# Patient Record
Sex: Male | Born: 1950 | Race: White | Hispanic: No | Marital: Married | Smoking: Current every day smoker
Health system: Southern US, Community
[De-identification: ages and names within clinical notes are randomized; demographics above are authoritative.]

## PROBLEM LIST (undated history)

## (undated) DIAGNOSIS — J449 Chronic obstructive pulmonary disease, unspecified: Secondary | ICD-10-CM

## (undated) DIAGNOSIS — I10 Essential (primary) hypertension: Secondary | ICD-10-CM

## (undated) DIAGNOSIS — M109 Gout, unspecified: Secondary | ICD-10-CM

## (undated) DIAGNOSIS — E785 Hyperlipidemia, unspecified: Secondary | ICD-10-CM

## (undated) HISTORY — DX: Gout, unspecified: M10.9

## (undated) HISTORY — PX: CATARACT EXTRACTION: SUR2

---

## 1997-10-27 ENCOUNTER — Encounter: Admission: RE | Admit: 1997-10-27 | Discharge: 1998-01-25 | Payer: Self-pay

## 2005-09-04 ENCOUNTER — Encounter: Admission: RE | Admit: 2005-09-04 | Discharge: 2005-09-04 | Payer: Self-pay | Admitting: Family Medicine

## 2005-10-22 ENCOUNTER — Ambulatory Visit: Payer: Self-pay | Admitting: Cardiology

## 2005-10-26 ENCOUNTER — Ambulatory Visit: Payer: Self-pay | Admitting: Cardiology

## 2006-01-07 ENCOUNTER — Ambulatory Visit: Payer: Self-pay | Admitting: Cardiovascular Disease

## 2014-08-06 ENCOUNTER — Telehealth: Payer: Self-pay | Admitting: Family Medicine

## 2014-08-07 ENCOUNTER — Encounter (INDEPENDENT_AMBULATORY_CARE_PROVIDER_SITE_OTHER): Payer: Self-pay

## 2014-08-07 ENCOUNTER — Ambulatory Visit (INDEPENDENT_AMBULATORY_CARE_PROVIDER_SITE_OTHER): Payer: Self-pay | Admitting: Family

## 2014-08-07 ENCOUNTER — Encounter: Payer: Self-pay | Admitting: Family

## 2014-08-07 VITALS — BP 155/76 | HR 116 | Temp 97.1°F | Ht 67.0 in | Wt 166.0 lb

## 2014-08-07 DIAGNOSIS — M542 Cervicalgia: Secondary | ICD-10-CM

## 2014-08-07 MED ORDER — CYCLOBENZAPRINE HCL 10 MG PO TABS: 10.0000 mg | ORAL_TABLET | Freq: Three times a day (TID) | ORAL | Status: DC | PRN

## 2014-08-07 MED ORDER — TRAMADOL HCL 50 MG PO TABS: 50.0000 mg | ORAL_TABLET | Freq: Three times a day (TID) | ORAL | Status: DC | PRN

## 2014-08-07 NOTE — Patient Instructions (Signed)
Back Pain, Adult Low back pain is very common. About 1 in 5 people have back pain.The cause of low back pain is rarely dangerous. The pain often gets better over time.About half of people with a sudden onset of back pain feel better in just 2 weeks. About 8 in 10 people feel better by 6 weeks.  CAUSES Some common causes of back pain include:  Strain of the muscles or ligaments supporting the spine.  Wear and tear (degeneration) of the spinal discs.  Arthritis.  Direct injury to the back. DIAGNOSIS Most of the time, the direct cause of low back pain is not known.However, back pain can be treated effectively even when the exact cause of the pain is unknown.Answering your caregiver's questions about your overall health and symptoms is one of the most accurate ways to make sure the cause of your pain is not dangerous. If your caregiver needs more information, he or she may order lab work or imaging tests (X-rays or MRIs).However, even if imaging tests show changes in your back, this usually does not require surgery. HOME CARE INSTRUCTIONS For many people, back pain returns.Since low back pain is rarely dangerous, it is often a condition that people can learn to manageon their own.   Remain active. It is stressful on the back to sit or stand in one place. Do not sit, drive, or stand in one place for more than 30 minutes at a time. Take short walks on level surfaces as soon as pain allows.Try to increase the length of time you walk each day.  Do not stay in bed.Resting more than 1 or 2 days can delay your recovery.  Do not avoid exercise or work.Your body is made to move.It is not dangerous to be active, even though your back may hurt.Your back will likely heal faster if you return to being active before your pain is gone.  Pay attention to your body when you bend and lift. Many people have less discomfortwhen lifting if they bend their knees, keep the load close to their bodies,and  avoid twisting. Often, the most comfortable positions are those that put less stress on your recovering back.  Find a comfortable position to sleep. Use a firm mattress and lie on your side with your knees slightly bent. If you lie on your back, put a pillow under your knees.  Only take over-the-counter or prescription medicines as directed by your caregiver. Over-the-counter medicines to reduce pain and inflammation are often the most helpful.Your caregiver may prescribe muscle relaxant drugs.These medicines help dull your pain so you can more quickly return to your normal activities and healthy exercise.  Put ice on the injured area.  Put ice in a plastic bag.  Place a towel between your skin and the bag.  Leave the ice on for 15-20 minutes, 03-04 times a day for the first 2 to 3 days. After that, ice and heat may be alternated to reduce pain and spasms.  Ask your caregiver about trying back exercises and gentle massage. This may be of some benefit.  Avoid feeling anxious or stressed.Stress increases muscle tension and can worsen back pain.It is important to recognize when you are anxious or stressed and learn ways to manage it.Exercise is a great option. SEEK MEDICAL CARE IF:  You have pain that is not relieved with rest or medicine.  You have pain that does not improve in 1 week.  You have new symptoms.  You are generally not feeling well. SEEK   IMMEDIATE MEDICAL CARE IF:   You have pain that radiates from your back into your legs.  You develop new bowel or bladder control problems.  You have unusual weakness or numbness in your arms or legs.  You develop nausea or vomiting.  You develop abdominal pain.  You feel faint. Document Released: 06/25/2005 Document Revised: 12/25/2011 Document Reviewed: 10/27/2013 Exit[employer] Patient Information 2015 [employer]. This information is not intended to replace advice given to you by your health care provider. Make sure you  discuss any questions you have with your health care provider.  

## 2014-08-07 NOTE — Progress Notes (Signed)
   Subjective:    Patient ID: Victor Shields, male    DOB: 30-Apr-1951, 64 y.o.   MRN: [id number]  Shoulder Pain  The pain is present in the left shoulder. This is a new problem. The current episode started in the past 7 days. The problem occurs intermittently. The problem has been waxing and waning. The quality of the pain is described as aching. The pain is at a severity of 10/10. The pain is mild. Pertinent negatives include no fever, joint swelling, limited range of motion, numbness, stiffness or tingling. The symptoms are aggravated by activity. He has tried NSAIDS and cold for the symptoms. The treatment provided mild relief. His past medical history is significant for gout and osteoarthritis.      Review of Systems  Constitutional: Negative.  Negative for fever.  HENT: Negative.   Respiratory: Negative.   Cardiovascular: Negative.   Gastrointestinal: Negative.   Endocrine: Negative.   Genitourinary: Negative.   Musculoskeletal: Positive for gout. Negative for stiffness.  Neurological: Negative.  Negative for tingling and numbness.  Hematological: Negative.   Psychiatric/Behavioral: Negative.   All other systems reviewed and are negative.      Objective:   Physical Exam  Constitutional: He is oriented to person, place, and time. He appears well-developed and well-nourished. No distress.  Neck: Normal range of motion. Neck supple. No thyromegaly present.  Cardiovascular: Normal rate, regular rhythm, normal heart sounds and intact distal pulses.   No murmur heard. Pulmonary/Chest: Effort normal and breath sounds normal. No respiratory distress. He has no wheezes.  Abdominal: Soft. Bowel sounds are normal. He exhibits no distension. There is no tenderness.  Musculoskeletal: Normal range of motion. He exhibits tenderness (Left upper pain-with palpation ). He exhibits no edema.  Neurological: He is alert and oriented to person, place, and time. He has normal reflexes. No  cranial nerve deficit.  Skin: Skin is warm and dry. No rash noted. No erythema.  Psychiatric: He has a normal mood and affect. His behavior is normal. Judgment and thought content normal.  Vitals reviewed.   BP 155/76 mmHg  Pulse 116  Temp(Src) 97.1 F (36.2 C) (Oral)  Ht $R'5\' 7"'yd$  (1.702 m)  Wt 166 lb (75.297 kg)  BMI 25.99 kg/m2       Assessment & Plan:  1. Neck pain on left side -Rest -Ice and heat -PT needs to have regular CPE- BP elevated- Could be pain realted -Sedation precaution discussed - cyclobenzaprine (FLEXERIL) 10 MG tablet; Take 1 tablet (10 mg total) by mouth 3 (three) times daily as needed for muscle spasms.  Dispense: 30 tablet; Refill: 1 - traMADol (ULTRAM) 50 MG tablet; Take 1 tablet (50 mg total) by mouth every 8 (eight) hours as needed.  Dispense: 30 tablet; Refill: 0 - [place], FNP

## 2014-08-07 NOTE — Addendum Note (Signed)
Addended by: Prescott GumLAND, Celes Dedic M on: 08/07/2014 10:22 AM   Modules accepted: Kipp BroodSmartSet

## 2014-08-08 LAB — CMP14+EGFR
A/G RATIO: 1.7 (ref 1.1–2.5)
ALT: 20 IU/L (ref 0–44)
AST: 19 IU/L (ref 0–40)
Albumin: 3.9 g/dL (ref 3.6–4.8)
Alkaline Phosphatase: 107 IU/L (ref 39–117)
BUN/Creatinine Ratio: 14 (ref 10–22)
BUN: 10 mg/dL (ref 8–27)
CHLORIDE: 98 mmol/L (ref 97–108)
CO2: 25 mmol/L (ref 18–29)
Calcium: 9.3 mg/dL (ref 8.6–10.2)
Creatinine, Ser: 0.71 mg/dL — ABNORMAL LOW (ref 0.76–1.27)
GFR calc Af Amer: 115 mL/min/{1.73_m2} (ref >59)
GFR, EST NON AFRICAN AMERICAN: 99 mL/min/{1.73_m2} (ref >59)
GLUCOSE: 247 mg/dL — AB (ref 65–99)
Globulin, Total: 2.3 g/dL (ref 1.5–4.5)
Potassium: 3.7 mmol/L (ref 3.5–5.2)
SODIUM: 140 mmol/L (ref 134–144)
Total Bilirubin: 0.8 mg/dL (ref 0.0–1.2)
Total Protein: 6.2 g/dL (ref 6.0–8.5)

## 2014-08-09 ENCOUNTER — Other Ambulatory Visit: Payer: Self-pay | Admitting: Family

## 2014-08-09 MED ORDER — METFORMIN HCL 500 MG PO TABS: 500.0000 mg | ORAL_TABLET | Freq: Two times a day (BID) | ORAL | Status: DC

## 2014-08-09 NOTE — Telephone Encounter (Signed)
-----   Message from Victor Spencerhristy A [name], FNP sent at 08/09/2014  8:39 AM EST ----- Kidney and liver function stable Glucose very elevated- Pt needs to be on low carb diet and start taking metformin with a meal for diabetes- He needs to schedule appt asap! ( Pt states he does not have insurance until March)

## 2014-08-09 NOTE — Telephone Encounter (Signed)
Need to discuss lab results and diet.

## 2014-08-09 NOTE — Telephone Encounter (Signed)
His wife is aware of high blood sugar.  Begin Metformin BID and come by office for free blood sugar meter.  Patient has no insurance.

## 2015-04-06 DIAGNOSIS — H5200 Hypermetropia, unspecified eye: Secondary | ICD-10-CM | POA: Diagnosis not present

## 2015-04-06 DIAGNOSIS — H25813 Combined forms of age-related cataract, bilateral: Secondary | ICD-10-CM | POA: Diagnosis not present

## 2015-04-06 DIAGNOSIS — H5210 Myopia, unspecified eye: Secondary | ICD-10-CM | POA: Diagnosis not present

## 2016-12-25 ENCOUNTER — Encounter: Payer: Self-pay | Admitting: Physician Assistant

## 2016-12-25 ENCOUNTER — Ambulatory Visit (INDEPENDENT_AMBULATORY_CARE_PROVIDER_SITE_OTHER): Payer: Medicare HMO | Admitting: Physician Assistant

## 2016-12-25 VITALS — BP 123/65 | HR 71 | Temp 97.1°F | Ht 67.0 in | Wt 157.2 lb

## 2016-12-25 DIAGNOSIS — J399 Disease of upper respiratory tract, unspecified: Secondary | ICD-10-CM | POA: Diagnosis not present

## 2016-12-25 DIAGNOSIS — H6123 Impacted cerumen, bilateral: Secondary | ICD-10-CM

## 2016-12-25 MED ORDER — SULFAMETHOXAZOLE-TRIMETHOPRIM 800-160 MG PO TABS: 1.0000 | ORAL_TABLET | Freq: Two times a day (BID) | ORAL | 0 refills | Status: DC

## 2016-12-25 MED ORDER — BUDESONIDE-FORMOTEROL FUMARATE 80-4.5 MCG/ACT IN AERO: 2.0000 | INHALATION_SPRAY | Freq: Two times a day (BID) | RESPIRATORY_TRACT | 3 refills | Status: DC

## 2016-12-25 NOTE — Patient Instructions (Signed)
Ear Irrigation What is ear irrigation? Ear irrigation is a procedure to wash dirt and wax out of your ear canal. This procedure is also called lavage. You may need ear irrigation if you are having trouble hearing because of a buildup of earwax. You may also have ear irrigation as part of the treatment for an ear infection. Getting wax and dirt out of your ear canal can help some medicines given as ear drops work better. How is ear irrigation performed? The procedure may vary among health care providers and hospitals. You may be given ear drops to put in your ear 15-20 minutes before irrigation. This helps loosen the wax. Then, a syringe containing water and a sterile salt solution (saline) can be gently inserted into the ear canal. The saline is used to flush out wax and other debris. Ear irrigation kits are also available to use at home. Ask your health care provider if this is an option for you. Use a home irrigation kit only as told by your health care provider. Read the package instructions carefully. Follow the directions for using the syringe. Use water that is room temperature.  Do not do ear irrigation at home if you:  Have diabetes. Diabetes increases the risk of infection.  Have a hole or tear in your eardrum.  Have tubes in your ears. What are the risks of ear irrigation? Generally, this is a safe procedure. However, problems may occur, including:  Infection.  Pain.  Hearing loss.  Pushing water and debris into the eardrum. This can occur if there are holes in the eardrum.  Ear irrigation failing to work. How should I care for my ears after having them irrigated? Cleaning   Clean the outside of your ear with a soft washcloth daily.  If told by your health care provider, use a few drops of baby oil, mineral oil, glycerin, hydrogen peroxide, or over-the-counter earwax softening drops.  Do not use cotton swabs to clean your ears. These can push wax down into the ear  canal.  Do not put anything into your ears to try to remove wax. This includes ear candles. General Instructions   Take over-the-counter and prescription medicines only as told by your health care provider.  If you were prescribed an antibiotic medicine, use it as told by your health care provider. Do not stop using the antibiotic even if your condition improves.  Keep all follow-up visits as told by your health care provider. This is important.  Visit your health care provider at least once a year to have your ears and hearing checked. When should I seek medical care? Seek medical care if:  Your hearing is not improving or is getting worse.  You have pain or redness in your ear.  You have fluid, blood, or pus coming out of your ear. This information is not intended to replace advice given to you by your health care provider. Make sure you discuss any questions you have with your health care provider. Document Released: 07/22/2015 Document Revised: 05/21/2016 Document Reviewed: 12/02/2014 Elsevier Interactive Patient Education  2017 [employer]  

## 2016-12-25 NOTE — Progress Notes (Addendum)
Subjective:     Patient ID: Victor Shields, male   DOB: June 22, 1951, 66 y.o.   MRN: [id number]  HPI Pt with multiple complaints #1- cough and congestion for 2-3 weeks Cough is productive of colored sputum He is a long term smoker 'Using a friends Symbicort and it has helped with some sx #2- Bilateral ear fullness and decrease in hearing  Review of Systems  Constitutional: Negative for activity change, appetite change, chills, fatigue and fever.  HENT: Positive for congestion, hearing loss, postnasal drip and sinus pressure. Negative for ear discharge, ear pain, rhinorrhea, sinus pain and sore throat.   Respiratory: Positive for cough and chest tightness. Negative for choking, shortness of breath and wheezing.   Cardiovascular: Negative.        Objective:   Physical Exam  Constitutional: He appears well-developed and well-nourished.  HENT:  Right Ear: External ear normal.  Left Ear: External ear normal.  Mouth/Throat: Oropharynx is clear and moist. No oropharyngeal exudate.  Neck: Neck supple. No JVD present.  No bruits  Cardiovascular: Normal rate, regular rhythm and normal heart sounds.   No murmur heard. Pulmonary/Chest: Effort normal. He has wheezes. He has no rales.  Coarse lungs sounds bilat  Lymphadenopathy:    He has no cervical adenopathy.  Nursing note and vitals reviewed. Both ears canals impacted with cerumen Ears irrigated with large cerumen plugs removed Following irrigation canals/TM's nl     Assessment:     1. Upper respiratory disease   2. Impacted cerumen, bilateral        Plan:     OTC ear wax softener monthly No Q tip use Rx for his own Symbicort  Inhaler- rinse mouth after use Bactrim DS 1 po bid x 10 days  Fluids Rest Decrease smoking use Discussed needs regular appt with regular provider in the next month to follow his chronic health issues

## 2017-11-13 NOTE — Progress Notes (Signed)
Subjective: CC: ear pain, balance problem, left eye irritated PCP: Sharion Balloon, FNP NID:[id number] Victor Shields is a 67 y.o. male presenting to clinic today for:  1. Ear pain Patient reports that he has had bilateral ill fullness for a couple of weeks now.  He reports feeling off balance.  He reports associated sinus congestion.  No fevers, chills, diarrhea or vomiting.  He did have some nausea when he became dizzy this morning.  No vertigo.  He reports sensation of decreased hearing bilaterally.  He has a chronic productive cough without hemoptysis.  He is an every day smoker.  2.  Vision change Patient reports that he has had decreased vision in the left eye since Sunday.  He reports that this is associated with increased tearing.  Denies any pain in the eye.  No irritation.  No redness.  No pain with moving the eye around.  He does wear corrective lenses at baseline.  He has not seen his eye doctor since last year but is due for an appointment.   ROS: Per HPI  Allergies  Allergen Reactions  . Penicillins Swelling   Past Medical History:  Diagnosis Date  . Gout     Current Outpatient Medications:  .  budesonide-formoterol (SYMBICORT) 80-4.5 MCG/ACT inhaler, Inhale 2 puffs into the lungs 2 (two) times daily., Disp: 1 Inhaler, Rfl: 3 Social History   Socioeconomic History  . Marital status: Married    Spouse name: Not on file  . Number of children: Not on file  . Years of education: Not on file  . Highest education level: Not on file  Occupational History  . Not on file  Social Needs  . Financial resource strain: Not on file  . Food insecurity:    Worry: Not on file    Inability: Not on file  . Transportation needs:    Medical: Not on file    Non-medical: Not on file  Tobacco Use  . Smoking status: Current Every Day Smoker    Packs/day: 1.00    Years: 40.00    Pack years: 40.00    Types: Cigarettes    Start date: 07/10/1975  . Smokeless tobacco: Never Used    Substance and Sexual Activity  . Alcohol use: Yes    Alcohol/week: 14.4 oz    Types: 24 Standard drinks or equivalent per week  . Drug use: No  . Sexual activity: Not on file  Lifestyle  . Physical activity:    Days per week: Not on file    Minutes per session: Not on file  . Stress: Not on file  Relationships  . Social connections:    Talks on phone: Not on file    Gets together: Not on file    Attends religious service: Not on file    Active member of club or organization: Not on file    Attends meetings of clubs or organizations: Not on file    Relationship status: Not on file  . Intimate partner violence:    Fear of current or ex partner: Not on file    Emotionally abused: Not on file    Physically abused: Not on file    Forced sexual activity: Not on file  Other Topics Concern  . Not on file  Social History Narrative  . Not on file   No family history on file.  Objective: Office vital signs reviewed. BP (!) 149/73   Pulse 84   Temp 98.9 F (37.2 C) (Oral)  Ht _0  (1.702 m)   Wt 141 lb (64 kg)   BMI 22.08 kg/m   Physical Examination:  General: Awake, alert, nontoxic, No acute distress HEENT: Normal    Neck: No masses palpated. No lymphadenopathy    Ears: R Tympanic membranes intact, normal light reflex, no erythema, no bulging; L TM with moderate amounts of purulence.  There is associated tenderness.  No tragal tenderness or mastoid tenderness to palpation.    Eyes: PERRLA, extraocular membranes intact, sclera white    Nose: nasal turbinates moist, clear nasal discharge    Throat: moist mucus membranes, no erythema, no tonsillar exudate.  Airway is patent Cardio: regular rate and rhythm, S1S2 heard, no murmurs appreciated Pulm: Globally decreased breath sounds.  No wheezes, rhonchi or rales; normal work of breathing on room air Extremities: warm, well perfused, No edema, cyanosis or clubbing; +2 pulses bilaterally MSK: normal gait and normal  station Skin: dry; intact; no rashes or lesions Neuro: 4/5 UE and LE Strength and light touch sensation grossly intact, cranial nerves II through XII grossly intact, with the exception of decreased visual acuity in the left eye and decreased hearing bilaterally.  Normal upper extremity and lower extremity cerebellar testing.  Alert and oriented x3.  Orthostatic VS for the past 24 hrs:  BP- Lying Pulse- Lying BP- Sitting Pulse- Sitting BP- Standing at 0 minutes Pulse- Standing at 0 minutes  11/15/17 1437 160/69 81 149/73 84 140/72 87    Assessment/ Plan: 67 y.o. male   1. Dizziness Likely secondary to acute otitis media.  Patient had an essentially normal exam except for bilateral decreased hearing and decreased visual acuity in the left eye.  See separate point below.  His orthostatic vital signs were negative.   - CMP14+EGFR - CBC with Differential  2. Non-recurrent acute suppurative otitis media of left ear without spontaneous rupture of tympanic membrane Penicillin allergic.  Start doxycycline 100 mg p.o. twice daily for the next 10 days.  Check CMP, CBC.  3. Sensation of fullness in both ears Cerumen impaction appreciated on left.  Likely has sinus congestion as well.  Acute otitis media as above.  4. Impacted cerumen of left ear Irrigated.  5. Sinus congestion Flonase prescribed.  6. Change in vision No evidence of acute closure glaucoma.  Vision is blurred even with corrective lenses.  I did review with patient that he should see his ophthalmologist as soon as possible.  I question retinopathy given blood pressures.  Reasons for emergent evaluation the emergency department discussed.  We discussed that if the ophthalmologist is unable to find a reason for blurred vision that low threshold to obtain imaging of the brain to rule out stroke.   Orders Placed This Encounter  Procedures  . CMP14+EGFR  . CBC with Differential   Meds ordered this encounter  Medications  .  fluticasone (FLONASE) 50 MCG/ACT nasal spray    Sig: Place 2 sprays into both nostrils daily.    Dispense:  16 g    Refill:  6  . doxycycline (VIBRA-TABS) 100 MG tablet    Sig: Take 1 tablet (100 mg total) by mouth 2 (two) times daily.    Dispense:  20 tablet    Refill:  [place], DO [place] [phone]

## 2017-11-15 ENCOUNTER — Encounter: Payer: Self-pay | Admitting: Family Medicine

## 2017-11-15 ENCOUNTER — Ambulatory Visit (INDEPENDENT_AMBULATORY_CARE_PROVIDER_SITE_OTHER): Payer: Medicare HMO | Admitting: Family Medicine

## 2017-11-15 VITALS — BP 149/73 | HR 84 | Temp 98.9°F | Ht 67.0 in | Wt 141.0 lb

## 2017-11-15 DIAGNOSIS — H539 Unspecified visual disturbance: Secondary | ICD-10-CM

## 2017-11-15 DIAGNOSIS — H938X3 Other specified disorders of ear, bilateral: Secondary | ICD-10-CM

## 2017-11-15 DIAGNOSIS — R0981 Nasal congestion: Secondary | ICD-10-CM

## 2017-11-15 DIAGNOSIS — H66002 Acute suppurative otitis media without spontaneous rupture of ear drum, left ear: Secondary | ICD-10-CM | POA: Diagnosis not present

## 2017-11-15 DIAGNOSIS — H6122 Impacted cerumen, left ear: Secondary | ICD-10-CM

## 2017-11-15 DIAGNOSIS — R42 Dizziness and giddiness: Secondary | ICD-10-CM | POA: Diagnosis not present

## 2017-11-15 MED ORDER — DOXYCYCLINE HYCLATE 100 MG PO TABS: 100.0000 mg | ORAL_TABLET | Freq: Two times a day (BID) | ORAL | 0 refills | Status: DC

## 2017-11-15 MED ORDER — FLUTICASONE PROPIONATE 50 MCG/ACT NA SUSP: 2.0000 | Freq: Every day | NASAL | 6 refills | Status: DC

## 2017-11-15 NOTE — Patient Instructions (Signed)
You had your left ear irrigated today.  I think that your dizziness is likely multifactorial.  As we discussed, I do recommend the start the Flonase nasal spray to reduce your sinus congestion.  If you develop any signs or symptoms of infection, please return for reevaluation.  Concerning your blurry vision in the left eye, I do recommend that you see your eye doctor for this.  Your neurologic exam was normal today but we cannot rule out retinopathy given your high blood pressure.  If you develop any other worrisome symptoms or signs we discussed, please seek immediate medical attention in the emergency department.  Dizziness Dizziness is a common problem. It is a feeling of unsteadiness or light-headedness. You may feel like you are about to faint. Dizziness can lead to injury if you stumble or fall. Anyone can become dizzy, but dizziness is more common in older adults. This condition can be caused by a number of things, including medicines, dehydration, or illness. Follow these instructions at home: Eating and drinking  Drink enough fluid to keep your urine clear or pale yellow. This helps to keep you from becoming dehydrated. Try to drink more clear fluids, such as water.  Do not drink alcohol.  Limit your caffeine intake if told to do so by your health care provider. Check ingredients and nutrition facts to see if a food or beverage contains caffeine.  Limit your salt (sodium) intake if told to do so by your health care provider. Check ingredients and nutrition facts to see if a food or beverage contains sodium. Activity  Avoid making quick movements. ? Rise slowly from chairs and steady yourself until you feel okay. ? In the morning, first sit up on the side of the bed. When you feel okay, stand slowly while you hold onto something until you know that your balance is fine.  If you need to stand in one place for a long time, move your legs often. Tighten and relax the muscles in your legs  while you are standing.  Do not drive or use heavy machinery if you feel dizzy.  Avoid bending down if you feel dizzy. Place items in your home so that they are easy for you to reach without leaning over. Lifestyle  Do not use any products that contain nicotine or tobacco, such as cigarettes and e-cigarettes. If you need help quitting, ask your health care provider.  Try to reduce your stress level by using methods such as yoga or meditation. Talk with your health care provider if you need help to manage your stress. General instructions  Watch your dizziness for any changes.  Take over-the-counter and prescription medicines only as told by your health care provider. Talk with your health care provider if you think that your dizziness is caused by a medicine that you are taking.  Tell a friend or a family member that you are feeling dizzy. If he or she notices any changes in your behavior, have this person call your health care provider.  Keep all follow-up visits as told by your health care provider. This is important. Contact a health care provider if:  Your dizziness does not go away.  Your dizziness or light-headedness gets worse.  You feel nauseous.  You have reduced hearing.  You have new symptoms.  You are unsteady on your feet or you feel like the room is spinning. Get help right away if:  You vomit or have diarrhea and are unable to eat or drink anything.  You have problems talking, walking, swallowing, or using your arms, hands, or legs.  You feel generally weak.  You are not thinking clearly or you have trouble forming sentences. It may take a friend or family member to notice this.  You have chest pain, abdominal pain, shortness of breath, or sweating.  Your vision changes.  You have any bleeding.  You have a severe headache.  You have neck pain or a stiff neck.  You have a fever. These symptoms may represent a serious problem that is an emergency. Do  not wait to see if the symptoms will go away. Get medical help right away. Call your local emergency services (911 in the [place] Do not drive yourself to the hospital. Summary  Dizziness is a feeling of unsteadiness or light-headedness. This condition can be caused by a number of things, including medicines, dehydration, or illness.  Anyone can become dizzy, but dizziness is more common in older adults.  Drink enough fluid to keep your urine clear or pale yellow. Do not drink alcohol.  Avoid making quick movements if you feel dizzy. Monitor your dizziness for any changes. This information is not intended to replace advice given to you by your health care provider. Make sure you discuss any questions you have with your health care provider. Document Released: 12/19/2000 Document Revised: 07/28/2016 Document Reviewed: 07/28/2016 Elsevier Interactive Patient Education  [employer].

## 2017-11-16 LAB — CMP14+EGFR
ALBUMIN: 3.6 g/dL (ref 3.6–4.8)
ALT: 20 IU/L (ref 0–44)
AST: 41 IU/L — ABNORMAL HIGH (ref 0–40)
Albumin/Globulin Ratio: 1.4 (ref 1.2–2.2)
Alkaline Phosphatase: 99 IU/L (ref 39–117)
BUN / CREAT RATIO: 10 (ref 10–24)
BUN: 6 mg/dL — AB (ref 8–27)
Bilirubin Total: 1.4 mg/dL — ABNORMAL HIGH (ref 0.0–1.2)
CALCIUM: 9 mg/dL (ref 8.6–10.2)
CO2: 24 mmol/L (ref 20–29)
CREATININE: 0.59 mg/dL — AB (ref 0.76–1.27)
Chloride: 99 mmol/L (ref 96–106)
GFR, EST AFRICAN AMERICAN: 120 mL/min/{1.73_m2} (ref >59)
GFR, EST NON AFRICAN AMERICAN: 104 mL/min/{1.73_m2} (ref >59)
GLUCOSE: 90 mg/dL (ref 65–99)
Globulin, Total: 2.5 g/dL (ref 1.5–4.5)
Potassium: 3.7 mmol/L (ref 3.5–5.2)
Sodium: 139 mmol/L (ref 134–144)
TOTAL PROTEIN: 6.1 g/dL (ref 6.0–8.5)

## 2017-11-16 LAB — CBC WITH DIFFERENTIAL/PLATELET
BASOS: 0 %
Basophils Absolute: 0 10*3/uL (ref 0.0–0.2)
EOS (ABSOLUTE): 0.2 10*3/uL (ref 0.0–0.4)
EOS: 2 %
HEMATOCRIT: 44.8 % (ref 37.5–51.0)
HEMOGLOBIN: 15.6 g/dL (ref 13.0–17.7)
Immature Grans (Abs): 0 10*3/uL (ref 0.0–0.1)
Immature Granulocytes: 0 %
Lymphocytes Absolute: 2.7 10*3/uL (ref 0.7–3.1)
Lymphs: 33 %
MCH: 34.5 pg — ABNORMAL HIGH (ref 26.6–33.0)
MCHC: 34.8 g/dL (ref 31.5–35.7)
MCV: 99 fL — ABNORMAL HIGH (ref 79–97)
MONOCYTES: 15 %
Monocytes Absolute: 1.2 10*3/uL — ABNORMAL HIGH (ref 0.1–0.9)
NEUTROS ABS: 4 10*3/uL (ref 1.4–7.0)
Neutrophils: 50 %
Platelets: 212 10*3/uL (ref 150–379)
RBC: 4.52 x10E6/uL (ref 4.14–5.80)
RDW: 13.6 % (ref 12.3–15.4)
WBC: 8.1 10*3/uL (ref 3.4–10.8)

## 2017-11-19 DIAGNOSIS — H2522 Age-related cataract, morgagnian type, left eye: Secondary | ICD-10-CM | POA: Diagnosis not present

## 2017-11-19 DIAGNOSIS — H25813 Combined forms of age-related cataract, bilateral: Secondary | ICD-10-CM | POA: Diagnosis not present

## 2017-11-19 DIAGNOSIS — H04123 Dry eye syndrome of bilateral lacrimal glands: Secondary | ICD-10-CM | POA: Diagnosis not present

## 2017-11-22 DIAGNOSIS — Z01818 Encounter for other preprocedural examination: Secondary | ICD-10-CM | POA: Diagnosis not present

## 2017-11-22 DIAGNOSIS — H268 Other specified cataract: Secondary | ICD-10-CM | POA: Diagnosis not present

## 2017-11-26 DIAGNOSIS — H268 Other specified cataract: Secondary | ICD-10-CM | POA: Diagnosis not present

## 2017-11-26 DIAGNOSIS — H2512 Age-related nuclear cataract, left eye: Secondary | ICD-10-CM | POA: Diagnosis not present

## 2017-11-26 DIAGNOSIS — H25812 Combined forms of age-related cataract, left eye: Secondary | ICD-10-CM | POA: Diagnosis not present

## 2017-12-06 DIAGNOSIS — H04123 Dry eye syndrome of bilateral lacrimal glands: Secondary | ICD-10-CM | POA: Diagnosis not present

## 2017-12-06 DIAGNOSIS — Z961 Presence of intraocular lens: Secondary | ICD-10-CM | POA: Diagnosis not present

## 2017-12-06 DIAGNOSIS — H25811 Combined forms of age-related cataract, right eye: Secondary | ICD-10-CM | POA: Diagnosis not present

## 2018-02-04 ENCOUNTER — Ambulatory Visit: Payer: Medicare HMO

## 2018-02-04 ENCOUNTER — Ambulatory Visit (INDEPENDENT_AMBULATORY_CARE_PROVIDER_SITE_OTHER): Payer: Medicare HMO | Admitting: Family Medicine

## 2018-02-04 ENCOUNTER — Emergency Department (HOSPITAL_COMMUNITY)
Admission: EM | Admit: 2018-02-04 | Discharge: 2018-02-04 | Disposition: A | Discharge status: Home / Self Care | Payer: Medicare HMO | Attending: Emergency Medicine | Admitting: Emergency Medicine

## 2018-02-04 ENCOUNTER — Emergency Department (HOSPITAL_COMMUNITY): Payer: Medicare HMO

## 2018-02-04 ENCOUNTER — Other Ambulatory Visit: Payer: Self-pay

## 2018-02-04 ENCOUNTER — Encounter (HOSPITAL_COMMUNITY): Payer: Self-pay | Admitting: Emergency Medicine

## 2018-02-04 DIAGNOSIS — Y999 Unspecified external cause status: Secondary | ICD-10-CM | POA: Diagnosis not present

## 2018-02-04 DIAGNOSIS — Y9389 Activity, other specified: Secondary | ICD-10-CM | POA: Diagnosis not present

## 2018-02-04 DIAGNOSIS — S4991XA Unspecified injury of right shoulder and upper arm, initial encounter: Secondary | ICD-10-CM | POA: Diagnosis present

## 2018-02-04 DIAGNOSIS — Z79899 Other long term (current) drug therapy: Secondary | ICD-10-CM | POA: Insufficient documentation

## 2018-02-04 DIAGNOSIS — W19XXXA Unspecified fall, initial encounter: Secondary | ICD-10-CM

## 2018-02-04 DIAGNOSIS — Y92009 Unspecified place in unspecified non-institutional (private) residence as the place of occurrence of the external cause: Secondary | ICD-10-CM | POA: Insufficient documentation

## 2018-02-04 DIAGNOSIS — M25511 Pain in right shoulder: Secondary | ICD-10-CM

## 2018-02-04 DIAGNOSIS — S42254A Nondisplaced fracture of greater tuberosity of right humerus, initial encounter for closed fracture: Secondary | ICD-10-CM | POA: Diagnosis not present

## 2018-02-04 DIAGNOSIS — W0110XA Fall on same level from slipping, tripping and stumbling with subsequent striking against unspecified object, initial encounter: Secondary | ICD-10-CM | POA: Insufficient documentation

## 2018-02-04 DIAGNOSIS — S42214A Unspecified nondisplaced fracture of surgical neck of right humerus, initial encounter for closed fracture: Secondary | ICD-10-CM | POA: Insufficient documentation

## 2018-02-04 DIAGNOSIS — F17210 Nicotine dependence, cigarettes, uncomplicated: Secondary | ICD-10-CM | POA: Insufficient documentation

## 2018-02-04 MED ORDER — LIDOCAINE 5 % EX PTCH: 1.0000 | MEDICATED_PATCH | CUTANEOUS | 0 refills | Status: DC

## 2018-02-04 MED ORDER — HYDROCODONE-ACETAMINOPHEN 5-325 MG PO TABS
1.0000 | ORAL_TABLET | Freq: Once | ORAL | Status: AC
  Administered 2018-02-04: 1 via ORAL
  Filled 2018-02-04: qty 1

## 2018-02-04 MED ORDER — HYDROCODONE-ACETAMINOPHEN 5-325 MG PO TABS: 1.0000 | ORAL_TABLET | ORAL | 0 refills | Status: DC | PRN

## 2018-02-04 NOTE — ED Provider Notes (Signed)
[facility] EMERGENCY DEPARTMENT Provider Note   CSN: [id number] Arrival date & time: 02/04/18  1444     History   Chief Complaint Chief Complaint  Patient presents with  . Shoulder Injury    HPI Chief Johnella MoloneyDavid Fennell is a 67 y.o. male presenting with a right humeral fracture sustained in a fall that happened on Thursday, 5 days ago. He was seen by his pcp today and a 1 view xray confirmed this diagnosis. He was placed in a sling and sent here for further evaluation.  He denies weakness or numbness in his arm or hand and denies any other injury.  He reports tripping in his home and landing on the outstretched arm. Denies head or neck pain and has no back, hip or lower extremity injury.  He has taken aleve and wife has applied aspercreme lotion.  He though his pain was improving over the weekend, but became significantly worse last night, waking him from sleep.   The history is provided by the patient.    Past Medical History:  Diagnosis Date  . Gout     There are no active problems to display for this patient.   Past Surgical History:  Procedure Laterality Date  . CATARACT EXTRACTION          Home Medications    Prior to Admission medications   Medication Sig Start Date End Date Taking? Authorizing Provider  budesonide-formoterol (SYMBICORT) 80-4.5 MCG/ACT inhaler Inhale 2 puffs into the lungs 2 (two) times daily. 12/25/16   Inis SizerWebster, William L, PA-C  doxycycline (VIBRA-TABS) 100 MG tablet Take 1 tablet (100 mg total) by mouth 2 (two) times daily. 11/15/17   Raliegh IpGottschalk, Ashly M, DO  fluticasone (FLONASE) 50 MCG/ACT nasal spray Place 2 sprays into both nostrils daily. 11/15/17   Raliegh IpGottschalk, Ashly M, DO  HYDROcodone-acetaminophen (NORCO/VICODIN) 5-325 MG tablet Take 1 tablet by mouth every 4 (four) hours as needed. 02/04/18   Burgess AmorIdol, Adrian Dinovo, PA-C  lidocaine (LIDODERM) 5 % Place 1 patch onto the skin daily. Remove & Discard patch within 12 hours or as directed by MD 02/04/18   Burgess AmorIdol,  Zaydyn Havey, PA-C    Family History History reviewed. No pertinent family history.  Social History Social History   Tobacco Use  . Smoking status: Current Every Day Smoker    Packs/day: 1.00    Years: 40.00    Pack years: 40.00    Types: Cigarettes    Start date: 07/10/1975  . Smokeless tobacco: Never Used  Substance Use Topics  . Alcohol use: Yes    Alcohol/week: 14.4 oz    Types: 24 Standard drinks or equivalent per week    Comment: "couple of beers daily"  . Drug use: No     Allergies   Penicillins   Review of Systems Review of Systems  HENT: Negative.   Respiratory: Negative.   Cardiovascular: Negative.   Gastrointestinal: Negative.   Musculoskeletal: Positive for arthralgias and joint swelling. Negative for myalgias.  Skin: Positive for color change. Negative for wound.  Neurological: Negative for weakness, numbness and headaches.     Physical Exam Updated Vital Signs BP 134/65 (BP Location: Right Arm)   Pulse (!) 103   Temp 98.4 F (36.9 C) (Oral)   Resp 18   Ht 5\' 8"  (1.727 m)   Wt 65.8 kg (145 lb)   SpO2 97%   BMI 22.05 kg/m   Physical Exam  Constitutional: He appears well-developed and well-nourished.  HENT:  Head: Atraumatic.  Neck: Normal  range of motion.  Cardiovascular:  Pulses equal bilaterally  Musculoskeletal: He exhibits tenderness. He exhibits no deformity.       Right shoulder: He exhibits tenderness, bony tenderness, swelling and pain. He exhibits no deformity and normal pulse.  Pt displays full flex/ext of hand, wrist and elbow.  No forearm pain. Radial pulse full, less than 2 sec cap refill in fingertips.  Normal sensation in fingers and hand. Large ecchymosis noted right shoulder and axilla.  Neurological: He is alert. He has normal strength. He displays normal reflexes. No sensory deficit.  Skin: Skin is warm and dry.  Psychiatric: He has a normal mood and affect.     ED Treatments / Results  Labs (all labs ordered are listed,  but only abnormal results are displayed) Labs Reviewed - No data to display  EKG None  Radiology Dg Shoulder Right  Result Date: 02/04/2018 CLINICAL DATA:  Fall with pain EXAM: RIGHT SHOULDER - 2+ VIEW COMPARISON:  None. FINDINGS: Acute minimally impacted right femoral neck fracture. AC joint is intact. No dislocation IMPRESSION: Acute nondisplaced right humeral neck fracture. Electronically Signed   By: Jasmine Pang M.D.   On: 02/04/2018 16:10    Procedures Procedures (including critical care time)  Medications Ordered in ED Medications  HYDROcodone-acetaminophen (NORCO/VICODIN) 5-325 MG per tablet 1 tablet (1 tablet Oral Given 02/04/18 1548)     Initial Impression / Assessment and Plan / ED Course  I have reviewed the triage vital signs and the nursing notes.  Pertinent labs & imaging results that were available during my care of the patient were reviewed by me and considered in my medical decision making (see chart for details).     Pt with nondisplaced fracture, sling immobilizer placed, hydrocodone, lidoderm patch for pain relief. Referral to ortho for f/u care.  Return precautions outlined.  Final Clinical Impressions(s) / ED Diagnoses   Final diagnoses:  Closed nondisplaced fracture of surgical neck of right humerus, unspecified fracture morphology, initial encounter    ED Discharge Orders        Ordered    HYDROcodone-acetaminophen (NORCO/VICODIN) 5-325 MG tablet  Every 4 hours PRN     02/04/18 1638    lidocaine (LIDODERM) 5 %  Every 24 hours     02/04/18 1638       Burgess Amor, PA-C 02/04/18 1643    Gerhard Munch, MD 02/05/18 [phone]

## 2018-02-04 NOTE — ED Triage Notes (Signed)
Patient states he fell 6 days ago and is complaining of right shoulder pain. States [facility] [place] and was told he had a broken bone to the upper part of his right arm and was sent to ER.

## 2018-02-04 NOTE — Discharge Instructions (Signed)
You may take the hydrocodone prescribed for pain relief.  This will make you drowsy - do not drive within 4 hours of taking this medication. Wear the sling and belt at all times (except in the shower, but be very careful to keep your arm still).

## 2018-02-04 NOTE — Progress Notes (Signed)
Subjective: CC: Fall PCP: Junie Spencer, FNP ZOX:WRUEAVW Victor Shields is a 67 y.o. male presenting to clinic today for:  1. Fall Patient notes that he fell on Friday and had immediate pain in the right shoulder.  He presents today for further evaluation.  He states he did not come in on Friday because the pain was so severe he could barely move.  Denies any sensation changes in the hand.  He has been wearing a sling.  Not currently on any blood thinners.   ROS: Per HPI  Allergies  Allergen Reactions  . Penicillins Swelling   Past Medical History:  Diagnosis Date  . Gout     Current Outpatient Medications:  .  budesonide-formoterol (SYMBICORT) 80-4.5 MCG/ACT inhaler, Inhale 2 puffs into the lungs 2 (two) times daily., Disp: 1 Inhaler, Rfl: 3 .  doxycycline (VIBRA-TABS) 100 MG tablet, Take 1 tablet (100 mg total) by mouth 2 (two) times daily., Disp: 20 tablet, Rfl: 0 .  fluticasone (FLONASE) 50 MCG/ACT nasal spray, Place 2 sprays into both nostrils daily., Disp: 16 g, Rfl: 6 Social History   Socioeconomic History  . Marital status: Married    Spouse name: Not on file  . Number of children: Not on file  . Years of education: Not on file  . Highest education level: Not on file  Occupational History  . Not on file  Social Needs  . Financial resource strain: Not on file  . Food insecurity:    Worry: Not on file    Inability: Not on file  . Transportation needs:    Medical: Not on file    Non-medical: Not on file  Tobacco Use  . Smoking status: Current Every Day Smoker    Packs/day: 1.00    Years: 40.00    Pack years: 40.00    Types: Cigarettes    Start date: 07/10/1975  . Smokeless tobacco: Never Used  Substance and Sexual Activity  . Alcohol use: Yes    Alcohol/week: 14.4 oz    Types: 24 Standard drinks or equivalent per week  . Drug use: No  . Sexual activity: Not on file  Lifestyle  . Physical activity:    Days per week: Not on file    Minutes per session:  Not on file  . Stress: Not on file  Relationships  . Social connections:    Talks on phone: Not on file    Gets together: Not on file    Attends religious service: Not on file    Active member of club or organization: Not on file    Attends meetings of clubs or organizations: Not on file    Relationship status: Not on file  . Intimate partner violence:    Fear of current or ex partner: Not on file    Emotionally abused: Not on file    Physically abused: Not on file    Forced sexual activity: Not on file  Other Topics Concern  . Not on file  Social History Narrative  . Not on file   No family history on file.  Objective: Office vital signs reviewed. There were no vitals taken for this visit.  Physical Examination:  General: Awake, alert, elderly male, No acute distress Extremities: warm, well perfused, No edema, cyanosis or clubbing; +2 pulses bilaterally MSK: ROM not assessed 2/2 pain Neuro: light touch sensation grossly intact  No results found.   Assessment/ Plan: 67 y.o. male   1. Fall, initial encounter X-ray was  obtained and personal evaluation of the x-ray shows what appears to be a fracture through the surgical neck of the right humerus.  No additional images were obtained secondary to fear that this may cause displacement of the fracture.  Results were discussed with the patient and his wife.  He was sent urgently to the emergency department by private vehicle.  His care was discussed with ED provider, Dr. Estell HarpinZammit, at [facility] who will be awaiting his arrival. - DG Shoulder Right; Future  2. Acute pain of right shoulder Appears to have a fracture of the humerus as above. - DG Shoulder Right; Future   Orders Placed This Encounter  Procedures  . DG Shoulder Right    Standing Status:   Future    Standing Expiration Date:   04/06/2019    Order Specific Question:   Reason for Exam (SYMPTOM  OR DIAGNOSIS REQUIRED)    Answer:   fall    Order Specific  Question:   Preferred imaging location?    Answer:   Internal     Raliegh IpAshly M Gottschalk, DO [facility] [phone]

## 2018-02-07 DIAGNOSIS — S42294A Other nondisplaced fracture of upper end of right humerus, initial encounter for closed fracture: Secondary | ICD-10-CM | POA: Diagnosis not present

## 2018-03-11 DIAGNOSIS — S42213A Unspecified displaced fracture of surgical neck of unspecified humerus, initial encounter for closed fracture: Secondary | ICD-10-CM | POA: Insufficient documentation

## 2018-03-11 DIAGNOSIS — S42294D Other nondisplaced fracture of upper end of right humerus, subsequent encounter for fracture with routine healing: Secondary | ICD-10-CM | POA: Diagnosis not present

## 2018-03-20 ENCOUNTER — Encounter: Payer: Self-pay | Admitting: Family Medicine

## 2018-03-20 ENCOUNTER — Telehealth: Payer: Self-pay | Admitting: Family Medicine

## 2018-03-20 ENCOUNTER — Other Ambulatory Visit: Payer: Self-pay | Admitting: Family Medicine

## 2018-03-20 ENCOUNTER — Ambulatory Visit (INDEPENDENT_AMBULATORY_CARE_PROVIDER_SITE_OTHER): Payer: Medicare HMO | Admitting: Family Medicine

## 2018-03-20 VITALS — BP 134/61 | HR 64 | Temp 98.1°F | Ht 68.0 in | Wt 149.0 lb

## 2018-03-20 DIAGNOSIS — H60392 Other infective otitis externa, left ear: Secondary | ICD-10-CM | POA: Diagnosis not present

## 2018-03-20 DIAGNOSIS — R062 Wheezing: Secondary | ICD-10-CM | POA: Insufficient documentation

## 2018-03-20 DIAGNOSIS — H6122 Impacted cerumen, left ear: Secondary | ICD-10-CM | POA: Diagnosis not present

## 2018-03-20 MED ORDER — NEOMYCIN-POLYMYXIN-HC 3.5-10000-1 OT SOLN: 4.0000 [drp] | Freq: Four times a day (QID) | OTIC | 0 refills | Status: AC

## 2018-03-20 MED ORDER — CIPROFLOXACIN-DEXAMETHASONE 0.3-0.1 % OT SUSP: 4.0000 [drp] | Freq: Two times a day (BID) | OTIC | 0 refills | Status: DC

## 2018-03-20 MED ORDER — BUDESONIDE-FORMOTEROL FUMARATE 80-4.5 MCG/ACT IN AERO: 2.0000 | INHALATION_SPRAY | Freq: Two times a day (BID) | RESPIRATORY_TRACT | 3 refills | Status: DC

## 2018-03-20 NOTE — Addendum Note (Signed)
Addended by: Sonny MastersAKES, LINDA M on: 03/20/2018 12:47 PM   Modules accepted: Orders

## 2018-03-20 NOTE — Patient Instructions (Addendum)
Ciprodex as prescribed. Debrox at night. No Q-Tips in ears.    Earwax Buildup, Adult The ears produce a substance called earwax that helps keep bacteria out of the ear and protects the skin in the ear canal. Occasionally, earwax can build up in the ear and cause discomfort or hearing loss. What increases the risk? This condition is more likely to develop in people who:  Are male.  Are elderly.  Naturally produce more earwax.  Clean their ears often with cotton swabs.  Use earplugs often.  Use in-ear headphones often.  Wear hearing aids.  Have narrow ear canals.  Have earwax that is overly thick or sticky.  Have eczema.  Are dehydrated.  Have excess hair in the ear canal.  What are the signs or symptoms? Symptoms of this condition include:  Reduced or muffled hearing.  A feeling of fullness in the ear or feeling that the ear is plugged.  Fluid coming from the ear.  Ear pain.  Ear itch.  Ringing in the ear.  Coughing.  An obvious piece of earwax that can be seen inside the ear canal.  How is this diagnosed? This condition may be diagnosed based on:  Your symptoms.  Your medical history.  An ear exam. During the exam, your health care provider will look into your ear with an instrument called an otoscope.  You may have tests, including a hearing test. How is this treated? This condition may be treated by:  Using ear drops to soften the earwax.  Having the earwax removed by a health care provider. The health care provider may: ? Flush the ear with water. ? Use an instrument that has a loop on the end (curette). ? Use a suction device.  Surgery to remove the wax buildup. This may be done in severe cases.  Follow these instructions at home:  Take over-the-counter and prescription medicines only as told by your health care provider.  Do not put any objects, including cotton swabs, into your ear. You can clean the opening of your ear canal with a  washcloth or facial tissue.  Follow instructions from your health care provider about cleaning your ears. Do not over-clean your ears.  Drink enough fluid to keep your urine clear or pale yellow. This will help to thin the earwax.  Keep all follow-up visits as told by your health care provider. If earwax builds up in your ears often or if you use hearing aids, consider seeing your health care provider for routine, preventive ear cleanings. Ask your health care provider how often you should schedule your cleanings.  If you have hearing aids, clean them according to instructions from the manufacturer and your health care provider. Contact a health care provider if:  You have ear pain.  You develop a fever.  You have blood, pus, or other fluid coming from your ear.  You have hearing loss.  You have ringing in your ears that does not go away.  Your symptoms do not improve with treatment.  You feel like the room is spinning (vertigo). Summary  Earwax can build up in the ear and cause discomfort or hearing loss.  The most common symptoms of this condition include reduced or muffled hearing and a feeling of fullness in the ear or feeling that the ear is plugged.  This condition may be diagnosed based on your symptoms, your medical history, and an ear exam.  This condition may be treated by using ear drops to soften the earwax  or by having the earwax removed by a health care provider.  Do not put any objects, including cotton swabs, into your ear. You can clean the opening of your ear canal with a washcloth or facial tissue. This information is not intended to replace advice given to you by your health care provider. Make sure you discuss any questions you have with your health care provider. Document Released: 08/02/2004 Document Revised: 09/05/2016 Document Reviewed: 09/05/2016 Elsevier Interactive Patient Education  [employer]2018 [employer]

## 2018-03-20 NOTE — Telephone Encounter (Signed)
I sent a prescription in for neomycin-polymyxin-hydrocortison drops. Please let the pt know.

## 2018-03-20 NOTE — Telephone Encounter (Signed)
Pt aware another rx sent in.

## 2018-03-20 NOTE — Progress Notes (Addendum)
Subjective:    Patient ID: Victor Shields, male    DOB: [birth year], 67 y.o.   MRN: [id number]  Chief Complaint:  Ear Fullness   HPI: Victor Shields is a 67 y.o. male presenting on 03/20/2018 for Ear Fullness  Pt reports he has fullness and decreased hearing in his left ear. He reports this started over a week ago and is constant. Pt denies using Q-tips. States he has not tried anything to relieve the symptoms. Denies dizziness, ear pain, headache, or fevers. He also reports he has been wheezing and is out of his Symbicort and would like this refilled. Pt reports the wheezing is worse with outside activities. States this happens one to two times per week. Denies other associated symptoms.  Relevant past medical, surgical, family and social history reviewed and updated as indicated. Interim medical history since our last visit reviewed. Allergies and medications reviewed and updated. DATA REVIEWED: CHART IN EPIC  Family History reviewed for pertinent findings.  Past Medical History:  Diagnosis Date  . Gout     Past Surgical History:  Procedure Laterality Date  . CATARACT EXTRACTION      Social History   Socioeconomic History  . Marital status: Married    Spouse name: Not on file  . Number of children: Not on file  . Years of education: Not on file  . Highest education level: Not on file  Occupational History  . Not on file  Social Needs  . Financial resource strain: Not on file  . Food insecurity:    Worry: Not on file    Inability: Not on file  . Transportation needs:    Medical: Not on file    Non-medical: Not on file  Tobacco Use  . Smoking status: Current Every Day Smoker    Packs/day: 1.00    Years: 40.00    Pack years: 40.00    Types: Cigarettes    Start date: 07/10/1975  . Smokeless tobacco: Never Used  Substance and Sexual Activity  . Alcohol use: Yes    Alcohol/week: 24.0 standard drinks    Types: 24 Standard drinks or equivalent per week    Comment: "couple of beers daily"  . Drug use: No  . Sexual activity: Not on file  Lifestyle  . Physical activity:    Days per week: Not on file    Minutes per session: Not on file  . Stress: Not on file  Relationships  . Social connections:    Talks on phone: Not on file    Gets together: Not on file    Attends religious service: Not on file    Active member of club or organization: Not on file    Attends meetings of clubs or organizations: Not on file    Relationship status: Not on file  . Intimate partner violence:    Fear of current or ex partner: Not on file    Emotionally abused: Not on file    Physically abused: Not on file    Forced sexual activity: Not on file  Other Topics Concern  . Not on file  Social History Narrative  . Not on file    Allergies as of 03/20/2018      Reactions   Penicillins Swelling      Medication List        Accurate as of 03/20/18  9:54 AM. Always use your most recent med list.  budesonide-formoterol 80-4.5 MCG/ACT inhaler Commonly known as:  SYMBICORT Inhale 2 puffs into the lungs 2 (two) times daily.   ciprofloxacin-dexamethasone OTIC suspension Commonly known as:  CIPRODEX Place 4 drops into the left ear 2 (two) times daily for 7 days.       Allergies  Allergen Reactions  . Penicillins Swelling    Review of Systems  Constitutional: Negative for chills, fatigue and fever.  HENT: Negative for congestion, ear discharge, ear pain, rhinorrhea and sore throat.        Ear fullness and decreased hearing in left ear.   Respiratory: Positive for cough and wheezing. Negative for chest tightness and shortness of breath.   Cardiovascular: Negative for chest pain and palpitations.  Neurological: Negative for dizziness, light-headedness and headaches.  All other systems reviewed and are negative.       Objective:    BP 134/61   Pulse 64   Temp 98.1 F (36.7 C) (Oral)   Ht 5\' 8"  (1.727 m)   Wt 149 lb (67.6 kg)   BMI  22.66 kg/m    Wt Readings from Last 3 Encounters:  03/20/18 149 lb (67.6 kg)  02/04/18 145 lb (65.8 kg)  11/15/17 141 lb (64 kg)    Physical Exam  Constitutional: He is oriented to person, place, and time. He appears well-developed and well-nourished.  HENT:  Right Ear: Hearing, tympanic membrane, external ear and ear canal normal.  Left ear cerumen impaction. Rinne test BC>AC in left, AC>BC in right. Weber equal in both ears.   Eyes: Pupils are equal, round, and reactive to light. EOM are normal.  Cardiovascular: Normal rate, regular rhythm, normal heart sounds and intact distal pulses.  Pulmonary/Chest: Effort normal. He has wheezes in the right lower field and the left lower field.  Neurological: He is alert and oriented to person, place, and time.  Skin: Skin is warm and dry. Capillary refill takes less than 2 seconds.  Psychiatric: He has a normal mood and affect. His behavior is normal. Judgment and thought content normal.  Nursing note and vitals reviewed.   Left ear lavage (warm water and peroxide) and cerumen removal with ear curette and alligator forceps. Large amount of cerumen removed. Small amount remains in canal. Pt will use Debrox at night. Canal with erythema and tenderness post cerumen removal TM without erythema or bulging post cerumen removal. Left Rinne test AC>BC post cerumen removal.     Assessment & Plan:   1. Impacted cerumen of left ear -Cerumen removal in office. Debrox at night. No Q-Tips in ears.   2. Bilateral wheezing -Avoid triggers, use medications as prescribed. Rinse mouth after use.  - budesonide-formoterol (SYMBICORT) 80-4.5 MCG/ACT inhaler; Inhale 2 puffs into the lungs 2 (two) times daily.  Dispense: 1 Inhaler; Refill: 3  3. Other infective acute otitis externa of left ear.  -Ciprodex Otic Solution. Place 4 drops into the left ear 2 (two) times daily for 7 days., Starting Thu 03/20/2018, Until Thu 03/27/2018, Normal  Follow up plan: Return if  symptoms worsen or fail to improve.  Educational handout given for Earwax buildup   The above assessment and management plan was discussed with the patient. The patient verbalized understanding of and has agreed to the management plan. Patient is aware to call the clinic if symptoms persist or worsen. Patient is aware when to return to the clinic for a follow-up visit. Patient educated on when it is appropriate to go to the emergency department.   Kari BaarsMichelle Cara Aguino,  FNP-C [facility] [phone]   Pharmacy called, pt cannot afford the ciprodex prescribed, will change to  neomycin-polymyxin-hydrocortisone (CORTISPORIN) OTIC solution           Place 4 drops into the left ear 4 (four) times daily for 7 days., Starting Thu 03/20/2018, Until Thu 03/27/2018, Normal   Pt aware.

## 2018-09-16 ENCOUNTER — Telehealth: Payer: Self-pay | Admitting: Family

## 2018-10-17 ENCOUNTER — Other Ambulatory Visit: Payer: Self-pay | Admitting: Physician Assistant

## 2018-10-17 DIAGNOSIS — R062 Wheezing: Secondary | ICD-10-CM

## 2018-11-10 ENCOUNTER — Encounter: Payer: Self-pay | Admitting: Family Medicine

## 2018-11-10 ENCOUNTER — Ambulatory Visit (INDEPENDENT_AMBULATORY_CARE_PROVIDER_SITE_OTHER): Payer: Medicare HMO | Admitting: Family Medicine

## 2018-11-10 ENCOUNTER — Other Ambulatory Visit: Payer: Self-pay

## 2018-11-10 DIAGNOSIS — R062 Wheezing: Secondary | ICD-10-CM

## 2018-11-10 MED ORDER — BUDESONIDE-FORMOTEROL FUMARATE 80-4.5 MCG/ACT IN AERO: 2.0000 | INHALATION_SPRAY | Freq: Two times a day (BID) | RESPIRATORY_TRACT | 0 refills | Status: DC

## 2018-11-10 NOTE — Progress Notes (Signed)
Virtual Visit via telephone Note Due to COVID-19, visit is conducted virtually and was requested by patient. This visit type was conducted due to national recommendations for restrictions regarding the COVID-19 Pandemic (e.g. social distancing) in an effort to limit this patient's exposure and mitigate transmission in our community. All issues noted in this document were discussed and addressed.  A physical exam was not performed with this format.   I connected with Victor Shields on 11/10/18 at 1520 by telephone and verified that I am speaking with the correct person using two identifiers. Victor Shields is currently located at home and family is currently with them during visit. The provider, Kari Baars, FNP is located in their office at time of visit.  I discussed the limitations, risks, security and privacy concerns of performing an evaluation and management service by telephone and the availability of in person appointments. I also discussed with the patient that there may be a patient responsible charge related to this service. The patient expressed understanding and agreed to proceed.  Subjective:  Patient ID: Victor Shields, male    DOB: [birth year], 68 y.o.   MRN: [id number]  Chief Complaint:  Medication Refill and Wheezing   HPI: Victor Shields is a 68 y.o. male presenting on 11/10/2018 for Medication Refill and Wheezing   Pt reports he needs a refill on his Symbicort. States he has noticed an increase in wheezing and cough. States he only uses the Symbicort when he has an increase in wheezing and cough. He does not use on a daily basis. He denies fever, chills, chest pain, shortness of breath, palpitations, or syncope,. No weakness, confusion, or fatigue. No travel or known sick exposures. He continues to smoke at least 1 PPD.   Medication Refill  This is a chronic problem. The problem has been waxing and waning. Associated symptoms include coughing. Pertinent  negatives include no arthralgias, chest pain, chills, congestion, fatigue, fever, headaches, myalgias, sore throat or weakness.  Wheezing   This is a chronic problem. The current episode started more than 1 month ago. The problem occurs intermittently. The problem has been waxing and waning. Associated symptoms include coughing. Pertinent negatives include no chest pain, chills, fever, headaches, rhinorrhea, shortness of breath, sore throat or sputum production. The symptoms are aggravated by exercise, lying flat, weather changes and smoke. He has tried beta agonist inhalers and steroid inhaler for the symptoms. The treatment provided significant relief.     Relevant past medical, surgical, family, and social history reviewed and updated as indicated.  Allergies and medications reviewed and updated.   Past Medical History:  Diagnosis Date  . Gout     Past Surgical History:  Procedure Laterality Date  . CATARACT EXTRACTION      Social History   Socioeconomic History  . Marital status: Married    Spouse name: Not on file  . Number of children: Not on file  . Years of education: Not on file  . Highest education level: Not on file  Occupational History  . Not on file  Social Needs  . Financial resource strain: Not on file  . Food insecurity:    Worry: Not on file    Inability: Not on file  . Transportation needs:    Medical: Not on file    Non-medical: Not on file  Tobacco Use  . Smoking status: Current Every Day Smoker    Packs/day: 1.00    Years: 40.00    Pack years: 40.00  Types: Cigarettes    Start date: 07/10/1975  . Smokeless tobacco: Never Used  Substance and Sexual Activity  . Alcohol use: Yes    Alcohol/week: 24.0 standard drinks    Types: 24 Standard drinks or equivalent per week    Comment: "couple of beers daily"  . Drug use: No  . Sexual activity: Not on file  Lifestyle  . Physical activity:    Days per week: Not on file    Minutes per session: Not on  file  . Stress: Not on file  Relationships  . Social connections:    Talks on phone: Not on file    Gets together: Not on file    Attends religious service: Not on file    Active member of club or organization: Not on file    Attends meetings of clubs or organizations: Not on file    Relationship status: Not on file  . Intimate partner violence:    Fear of current or ex partner: Not on file    Emotionally abused: Not on file    Physically abused: Not on file    Forced sexual activity: Not on file  Other Topics Concern  . Not on file  Social History Narrative  . Not on file    Outpatient Encounter Medications as of 11/10/2018  Medication Sig  . budesonide-formoterol (SYMBICORT) 80-4.5 MCG/ACT inhaler Inhale 2 puffs into the lungs 2 (two) times daily.  . [DISCONTINUED] budesonide-formoterol (SYMBICORT) 80-4.5 MCG/ACT inhaler Inhale 2 puffs into the lungs 2 (two) times daily.   No facility-administered encounter medications on file as of 11/10/2018.     Allergies  Allergen Reactions  . Penicillins Swelling    Review of Systems  Constitutional: Negative for chills, fatigue and fever.  HENT: Negative for congestion, rhinorrhea and sore throat.   Respiratory: Positive for cough and wheezing. Negative for sputum production, chest tightness and shortness of breath.   Cardiovascular: Negative for chest pain and palpitations.  Genitourinary: Negative for decreased urine volume and difficulty urinating.  Musculoskeletal: Negative for arthralgias, back pain and myalgias.  Skin: Negative for color change and pallor.  Neurological: Negative for dizziness, syncope, weakness, light-headedness and headaches.  Psychiatric/Behavioral: Negative for confusion.  All other systems reviewed and are negative.        Observations/Objective: No vital signs or physical exam, this was a telephone or virtual health encounter.  Pt alert and oriented, answers all questions appropriately, and able to  speak in full sentences.    Assessment and Plan: Victor Shields was seen today for medication refill and wheezing.  Diagnoses and all orders for this visit:  Bilateral wheezing Will refill Symbicort today. Education on proper dosing provided. Report any new or worsening symptoms. Try to avoid smoking. Medications as prescribed.  -     budesonide-formoterol (SYMBICORT) 80-4.5 MCG/ACT inhaler; Inhale 2 puffs into the lungs 2 (two) times daily.     Follow Up Instructions: Return in about 2 weeks (around 11/24/2018), or if symptoms worsen or fail to improve, for wheezing.    I discussed the assessment and treatment plan with the patient. The patient was provided an opportunity to ask questions and all were answered. The patient agreed with the plan and demonstrated an understanding of the instructions.   The patient was advised to call back or seek an in-person evaluation if the symptoms worsen or if the condition fails to improve as anticipated.  The above assessment and management plan was discussed with the patient. The patient verbalized  understanding of and has agreed to the management plan. Patient is aware to call the clinic if symptoms persist or worsen. Patient is aware when to return to the clinic for a follow-up visit. Patient educated on when it is appropriate to go to the emergency department.    I provided 15 minutes of non-face-to-face time during this encounter. The call started at 1520. The call ended at 1535. The other time was used for coordination of care.    Kari Baars, FNP-C [facility] [address] [place], [place] [address] [phone]

## 2018-11-13 ENCOUNTER — Ambulatory Visit (INDEPENDENT_AMBULATORY_CARE_PROVIDER_SITE_OTHER): Payer: Medicare HMO | Admitting: *Deleted

## 2018-11-13 ENCOUNTER — Other Ambulatory Visit: Payer: Self-pay

## 2018-11-13 VITALS — Ht 68.0 in | Wt 149.0 lb

## 2018-11-13 DIAGNOSIS — Z0000 Encounter for general adult medical examination without abnormal findings: Secondary | ICD-10-CM | POA: Diagnosis not present

## 2018-11-13 NOTE — Progress Notes (Signed)
MEDICARE ANNUAL WELLNESS VISIT  11/13/2018  Telephone Visit Disclaimer This Medicare AWV was conducted by telephone due to national recommendations for restrictions regarding the COVID-19 Pandemic (e.g. social distancing).  I verified, using two identifiers, that I am speaking with Victor Shields or their authorized healthcare agent. I discussed the limitations, risks, security, and privacy concerns of performing an evaluation and management service by telephone and the potential availability of an in-person appointment in the future. The patient expressed understanding and agreed to proceed.   Subjective:  Victor Shields is a 68 y.o. male patient of [name], Edilia Bo, FNP who had a Medicare Annual Wellness Visit today via telephone. Kern is Retired and lives with their spouse. he has 1 child. he reports that he is socially active and does interact with friends/family regularly. he is minimally physically active and enjoys reading.  Patient Care Team: Junie Spencer, FNP as PCP - General (Nurse Practitioner)  Advanced Directives 11/13/2018 02/04/2018  Does Patient Have a Medical Advance Directive? No No  Would patient like information on creating a medical advance directive? Yes (MAU/Ambulatory/Procedural Areas - Information given) -    Hospital Utilization Over the Past 12 Months: # of hospitalizations or ER visits: 0 # of surgeries: 0  Review of Systems    Patient reports that his overall health is unchanged compared to last year.  Patient Reported Readings (BP, Pulse, CBG, Weight, etc) Weight 149lbs  Review of Systems: History obtained from chart review and the patient General ROS: negative  All other systems negative.  Pain Assessment Pain : No/denies pain     Current Medications & Allergies (verified) Allergies as of 11/13/2018      Reactions   Penicillins Swelling      Medication List       Accurate as of Nov 13, 2018  3:01 PM. If you have any  questions, ask your nurse or doctor.        budesonide-formoterol 80-4.5 MCG/ACT inhaler Commonly known as:  SYMBICORT Inhale 2 puffs into the lungs 2 (two) times daily.       History (reviewed): Past Medical History:  Diagnosis Date  . Gout    Past Surgical History:  Procedure Laterality Date  . CATARACT EXTRACTION     No family history on file. Social History   Socioeconomic History  . Marital status: Married    Spouse name: [name]  . Number of children: 1  . Years of education: Not on file  . Highest education level: 9th grade  Occupational History  . Occupation: Retired    Associate Professor: [employer]  Social Needs  . Financial resource strain: Not hard at all  . Food insecurity:    Worry: Never true    Inability: Never true  . Transportation needs:    Medical: No    Non-medical: No  Tobacco Use  . Smoking status: Current Every Day Smoker    Packs/day: 1.00    Years: 40.00    Pack years: 40.00    Types: Cigarettes    Start date: 07/10/1975  . Smokeless tobacco: Never Used  Substance and Sexual Activity  . Alcohol use: Yes    Alcohol/week: 24.0 standard drinks    Types: 24 Standard drinks or equivalent per week    Comment: "couple of beers daily"  . Drug use: No  . Sexual activity: Not Currently  Lifestyle  . Physical activity:    Days per week: 0 days    Minutes  per session: 0 min  . Stress: Not at all  Relationships  . Social connections:    Talks on phone: Twice a week    Gets together: Twice a week    Attends religious service: Never    Active member of club or organization: Yes    Attends meetings of clubs or organizations: Never    Relationship status: Married  Other Topics Concern  . Not on file  Social History Narrative  . Not on file    Activities of Daily Living In your present state of health, do you have any difficulty performing the following activities: 11/13/2018  Hearing? N  Vision? N  Difficulty concentrating or making  decisions? N  Walking or climbing stairs? N  Dressing or bathing? N  Doing errands, shopping? N  Preparing Food and eating ? N  Using the Toilet? N  In the past six months, have you accidently leaked urine? N  Do you have problems with loss of bowel control? N  Managing your Medications? N  Managing your Finances? N  Housekeeping or managing your Housekeeping? N  Some recent data might be hidden    Patient Literacy How often do you need to have someone help you when you read instructions, pamphlets, or other written materials from your doctor or pharmacy?: 1 - Never What is the last grade level you completed in school?: 9th Grade  Exercise Current Exercise Habits: The patient does not participate in regular exercise at present, Exercise limited by: None identified  Diet Patient reports consuming 2 meals a day and 2 snack(s) a day Patient reports that his primary diet is: Regular Patient reports that she does have regular access to food.   Depression Screen PHQ 2/9 Scores 11/13/2018 03/20/2018 11/15/2017 12/25/2016  PHQ - 2 Score 0 0 0 0     Fall Risk Fall Risk  11/13/2018 03/20/2018 12/25/2016  Falls in the past year? 0 Yes No  Number falls in past yr: - 1 -  Injury with Fall? - Yes -     Objective:  Victor Shields seemed alert and oriented and he participated appropriately during our telephone visit.  Blood Pressure Weight BMI  BP Readings from Last 3 Encounters:  03/20/18 134/61  02/04/18 129/68  11/15/17 (!) 149/73   Wt Readings from Last 3 Encounters:  11/13/18 149 lb (67.6 kg)  03/20/18 149 lb (67.6 kg)  02/04/18 145 lb (65.8 kg)   BMI Readings from Last 1 Encounters:  11/13/18 22.66 kg/m    *Unable to obtain current vital signs, weight, and BMI due to telephone visit type  Hearing/Vision  . Atom did not seem to have difficulty with hearing/understanding during the telephone conversation . Reports that he has not had a formal eye exam by an eye care  professional within the past year . Reports that he has not had a formal hearing evaluation within the past year *Unable to fully assess hearing and vision during telephone visit type  Cognitive Function: 6CIT Screen 11/13/2018  What Year? 0 points  What month? 0 points  What time? 0 points  Count back from 20 0 points  Months in reverse 0 points  Repeat phrase 0 points  Total Score 0    Normal Cognitive Function Screening: Yes (Normal:0-7, Significant for Dysfunction: >8)  Immunization & Health Maintenance Record  There is no immunization history on file for this patient.  Health Maintenance  Topic Date Due  . Hepatitis C Screening  [birth year]  .  TETANUS/TDAP  10/03/1968  . COLONOSCOPY  10/04/1999  . PNA vac Low Risk Adult (1 of 2 - PCV13) 10/04/2014  . INFLUENZA VACCINE  02/07/2019       Assessment  This is a routine wellness examination for [employer].  Health Maintenance: Due or Overdue Health Maintenance Due  Topic Date Due  . Hepatitis C Screening  Feb 23, 1951  . TETANUS/TDAP  10/03/1968  . COLONOSCOPY  10/04/1999  . PNA vac Low Risk Adult (1 of 2 - PCV13) 10/04/2014    Foster Johnella Shields does not need a referral for Community Assistance: Care Management:   no Social Work:    no Prescription Assistance:  no Nutrition/Diabetes Education:  no   Plan:  Personalized Goals Goals Addressed            This Visit's Progress   . Quit Smoking        Personalized Health Maintenance & Screening Recommendations  Pneumococcal vaccine  Td vaccine Colorectal cancer screening Smoking cessation counseling  Lung Cancer Screening Recommended: yes (Low Dose CT Chest recommended if Age 43-80 years, 30 pack-year currently smoking OR have quit w/in past 15 years) Hepatitis C Screening recommended: no HIV Screening recommended: no  Advanced Directives: Written information was prepared per patient's request.  Referrals & Orders No orders of the defined  types were placed in this encounter.   Follow-up Plan . Follow-up with Junie Spencer, FNP as planned   I have personally reviewed and noted the following in the patient's chart:   . Medical and social history . Use of alcohol, tobacco or illicit drugs  . Current medications and supplements . Functional ability and status . Nutritional status . Physical activity . Advanced directives . List of other physicians . Hospitalizations, surgeries, and ER visits in previous 12 months . Vitals . Screenings to include cognitive, depression, and falls . Referrals and appointments  In addition, I have reviewed and discussed with Gaberiel Johnella Shields certain preventive protocols, quality metrics, and best practice recommendations. A written personalized care plan for preventive services as well as general preventive health recommendations is available and can be mailed to the patient at his request.      Josetta Huddle, LPN  signature  11/13/2018   I have reviewed and agree with the above AWV documentation.   Jannifer Rodney, FNP

## 2019-05-22 ENCOUNTER — Other Ambulatory Visit: Payer: Self-pay | Admitting: Family Medicine

## 2019-05-22 DIAGNOSIS — R062 Wheezing: Secondary | ICD-10-CM

## 2019-05-28 ENCOUNTER — Encounter: Payer: Self-pay | Admitting: Family

## 2019-05-28 ENCOUNTER — Ambulatory Visit (INDEPENDENT_AMBULATORY_CARE_PROVIDER_SITE_OTHER): Payer: Medicare HMO | Admitting: Family

## 2019-05-28 DIAGNOSIS — J42 Unspecified chronic bronchitis: Secondary | ICD-10-CM

## 2019-05-28 DIAGNOSIS — F17200 Nicotine dependence, unspecified, uncomplicated: Secondary | ICD-10-CM | POA: Diagnosis not present

## 2019-05-28 DIAGNOSIS — Z720 Tobacco use: Secondary | ICD-10-CM | POA: Insufficient documentation

## 2019-05-28 DIAGNOSIS — Z1159 Encounter for screening for other viral diseases: Secondary | ICD-10-CM | POA: Diagnosis not present

## 2019-05-28 MED ORDER — ALBUTEROL SULFATE HFA 108 (90 BASE) MCG/ACT IN AERS: 2.0000 | INHALATION_SPRAY | Freq: Four times a day (QID) | RESPIRATORY_TRACT | 2 refills | Status: DC | PRN

## 2019-05-28 MED ORDER — BUDESONIDE-FORMOTEROL FUMARATE 80-4.5 MCG/ACT IN AERO: 2.0000 | INHALATION_SPRAY | Freq: Two times a day (BID) | RESPIRATORY_TRACT | 3 refills | Status: DC

## 2019-05-28 NOTE — Progress Notes (Signed)
Virtual Visit via telephone Note Due to COVID-19 pandemic this visit was conducted virtually. This visit type was conducted due to national recommendations for restrictions regarding the COVID-19 Pandemic (e.g. social distancing, sheltering in place) in an effort to limit this patient's exposure and mitigate transmission in our community. All issues noted in this document were discussed and addressed.  A physical exam was not performed with this format.  I connected with Victor Shields on 05/28/19 at 2:10 pm by telephone and verified that I am speaking with the correct person using two identifiers. Victor Shields is currently located at home and wife is currently with him during visit. The provider, Evelina Dun, FNP is located in their office at time of visit.  I discussed the limitations, risks, security and privacy concerns of performing an evaluation and management service by telephone and the availability of in person appointments. I also discussed with the patient that there may be a patient responsible charge related to this service. The patient expressed understanding and agreed to proceed.   History and Present Illness:  Wheezing  This is a chronic problem. The current episode started more than 1 year ago. The problem occurs intermittently. The problem has been waxing and waning. Associated symptoms include coughing, shortness of breath (At times) and sputum production. Pertinent negatives include no chest pain, chills, fever, hemoptysis or neck pain. The symptoms are aggravated by pollens and smoke. He has tried rest for the symptoms. The treatment provided mild relief. His past medical history is significant for COPD.  Nicotine Dependence Presents for follow-up visit. His urge triggers include company of smokers. The symptoms have been stable. He smokes 1 pack of cigarettes per day.      Review of Systems  Constitutional: Negative for chills and fever.  Respiratory:  Positive for cough, sputum production, shortness of breath (At times) and wheezing. Negative for hemoptysis.   Cardiovascular: Negative for chest pain.  Musculoskeletal: Negative for neck pain.  All other systems reviewed and are negative.    Observations/Objective: No SOB or distress noted   Assessment and Plan: 1. Chronic bronchitis, unspecified chronic bronchitis type ([place]) Discussed importance of taking Symbicort BID every day Will give Albuterol as needed Smoking cessation disucssed - albuterol (VENTOLIN HFA) 108 (90 Base) MCG/ACT inhaler; Inhale 2 puffs into the lungs every 6 (six) hours as needed for wheezing or shortness of breath.  Dispense: 8 g; Refill: 2 - budesonide-formoterol (SYMBICORT) 80-4.5 MCG/ACT inhaler; Inhale 2 puffs into the lungs 2 (two) times daily.  Dispense: 1 Inhaler; Refill: 3 - CMP14+EGFR - CBC with Differential/Platelet  2. Need for hepatitis C screening test - CMP14+EGFR - CBC with Differential/Platelet - Hepatitis C antibody  3. Current smoker  Labs pending All Health Maintenance refused Healthy diet and exercise encouraged RTO in 6 months      I discussed the assessment and treatment plan with the patient. The patient was provided an opportunity to ask questions and all were answered. The patient agreed with the plan and demonstrated an understanding of the instructions.   The patient was advised to call back or seek an in-person evaluation if the symptoms worsen or if the condition fails to improve as anticipated.  The above assessment and management plan was discussed with the patient. The patient verbalized understanding of and has agreed to the management plan. Patient is aware to call the clinic if symptoms persist or worsen. Patient is aware when to return to the clinic for a follow-up visit.  Patient educated on when it is appropriate to go to the emergency department.   Time call ended:  2:19 pm  I provided 9 minutes of  non-face-to-face time during this encounter.    Evelina Dun, FNP

## 2019-08-03 ENCOUNTER — Encounter: Payer: Self-pay | Admitting: Physician Assistant

## 2019-08-03 ENCOUNTER — Ambulatory Visit (INDEPENDENT_AMBULATORY_CARE_PROVIDER_SITE_OTHER): Payer: Medicare HMO | Admitting: Physician Assistant

## 2019-08-03 DIAGNOSIS — B356 Tinea cruris: Secondary | ICD-10-CM

## 2019-08-03 DIAGNOSIS — R21 Rash and other nonspecific skin eruption: Secondary | ICD-10-CM | POA: Diagnosis not present

## 2019-08-03 MED ORDER — CLOTRIMAZOLE 1 % EX CREA: 1.0000 "application " | TOPICAL_CREAM | Freq: Two times a day (BID) | CUTANEOUS | 1 refills | Status: DC

## 2019-08-03 NOTE — Progress Notes (Signed)
Do  5  202      Telephone visit  Subjective: CC: Groin rash PCP: Junie Spencer, FNP [id number] Victor Shields is a 69 y.o. male calls for telephone consult today. Patient provides verbal consent for consult held via phone.  Patient is identified with 2 separate identifiers.  At this time the entire area is on COVID-19 social distancing and stay home orders are in place.  Patient is of higher risk and therefore we are performing this by a virtual method.  Location of patient: Home Location of provider: [facility] Others present for call: Wife [name]  Over the past week patient has had increasing redness in his groin.  It has begun to be on the scrotum also.  It is in the fold and on the skin there is even some starting to be on his penis.  It is painful and red.  There are no blisters or pustules.  There is some itching and pain she has tried some over-the-counter medications for but nothing has helped.  They had used some Chlortrimazole in the past when he had a small amount of this and it did help.  We will send in a new prescription for this.  I do feel it is probably in the yeast family and tinea curious.   ROS: Per HPI  Allergies  Allergen Reactions  . Penicillins Swelling   Past Medical History:  Diagnosis Date  . Gout     Current Outpatient Medications:  .  albuterol (VENTOLIN HFA) 108 (90 Base) MCG/ACT inhaler, Inhale 2 puffs into the lungs every 6 (six) hours as needed for wheezing or shortness of breath., Disp: 8 g, Rfl: 2 .  budesonide-formoterol (SYMBICORT) 80-4.5 MCG/ACT inhaler, Inhale 2 puffs into the lungs 2 (two) times daily., Disp: 1 Inhaler, Rfl: 3 .  clotrimazole (LOTRIMIN) 1 % cream, Apply 1 application topically 2 (two) times daily., Disp: 60 g, Rfl: 1  Assessment/ Plan: 69 y.o. male   1. Rash of groin - clotrimazole (LOTRIMIN) 1 % cream; Apply 1 application topically 2 (two) times daily.  Dispense: 60 g; Refill: 1  2. Jock itch - clotrimazole  (LOTRIMIN) 1 % cream; Apply 1 application topically 2 (two) times daily.  Dispense: 60 g; Refill: 1   No follow-ups on file.  Continue all other maintenance medications as listed above.  Start time: 1:56 PM End time: 2:02 PM  Meds ordered this encounter  Medications  . clotrimazole (LOTRIMIN) 1 % cream    Sig: Apply 1 application topically 2 (two) times daily.    Dispense:  60 g    Refill:  1    Order Specific Question:   Supervising Provider    Answer:   Raliegh Ip [[id number]]    Prudy Feeler PA-C [facility] [phone]

## 2019-08-10 ENCOUNTER — Ambulatory Visit (INDEPENDENT_AMBULATORY_CARE_PROVIDER_SITE_OTHER): Payer: Medicare HMO | Admitting: Physician Assistant

## 2019-08-10 ENCOUNTER — Encounter: Payer: Self-pay | Admitting: Physician Assistant

## 2019-08-10 DIAGNOSIS — B356 Tinea cruris: Secondary | ICD-10-CM

## 2019-08-10 MED ORDER — NYSTATIN 100000 UNIT/GM EX POWD: 1.0000 "application " | Freq: Three times a day (TID) | CUTANEOUS | 0 refills | Status: DC

## 2019-08-10 MED ORDER — FLUCONAZOLE 150 MG PO TABS: ORAL_TABLET | ORAL | 0 refills | Status: DC

## 2019-08-10 NOTE — Progress Notes (Signed)
      Telephone visit  Subjective: CC: Tinea cruris PCP: Junie Spencer, FNP UMP:NTIRWER Victor Shields is a 69 y.o. male calls for telephone consult today. Patient provides verbal consent for consult held via phone.  Patient is identified with 2 separate identifiers.  At this time the entire area is on COVID-19 social distancing and stay home orders are in place.  Patient is of higher risk and therefore we are performing this by a virtual method.  Location of patient: Home Location of provider: [facility] Others present for call: Wife  I had a phone visit with this patient last week and prescribed nystatin cream to apply to the area.  There has been some improvement is not completely better.  The most bothersome part or in the folds of his groin and behind his scrotum.  There is not scrotal or penis involvement at this time.  His wife reports that he is quite uncomfortable at times because of the area still being quite raw.  But she states that they are much better.  We had a discussion about doing a longer more intense treatment with oral medications we are going to use Diflucan 150 mg 1 weekly for 4 weeks.  We will also has him use nystatin powders to the area topically.   ROS: Per HPI  Allergies  Allergen Reactions  . Penicillins Swelling   Past Medical History:  Diagnosis Date  . Gout     Current Outpatient Medications:  .  albuterol (VENTOLIN HFA) 108 (90 Base) MCG/ACT inhaler, Inhale 2 puffs into the lungs every 6 (six) hours as needed for wheezing or shortness of breath., Disp: 8 g, Rfl: 2 .  budesonide-formoterol (SYMBICORT) 80-4.5 MCG/ACT inhaler, Inhale 2 puffs into the lungs 2 (two) times daily., Disp: 1 Inhaler, Rfl: 3 .  clotrimazole (LOTRIMIN) 1 % cream, Apply 1 application topically 2 (two) times daily., Disp: 60 g, Rfl: 1 .  fluconazole (DIFLUCAN) 150 MG tablet, 1 po q week x 4 weeks, Disp: 4 tablet, Rfl: 0 .  nystatin (MYCOSTATIN/NYSTOP) powder, Apply 1 application  topically 3 (three) times daily., Disp: 60 g, Rfl: 0  Assessment/ Plan: 69 y.o. male   1. Tinea cruris - nystatin (MYCOSTATIN/NYSTOP) powder; Apply 1 application topically 3 (three) times daily.  Dispense: 60 g; Refill: 0 - fluconazole (DIFLUCAN) 150 MG tablet; 1 po q week x 4 weeks  Dispense: 4 tablet; Refill: 0  No follow-ups on file.  Continue all other maintenance medications as listed above.  Start time: 2:37 PM End time: 2:45 PM  Meds ordered this encounter  Medications  . nystatin (MYCOSTATIN/NYSTOP) powder    Sig: Apply 1 application topically 3 (three) times daily.    Dispense:  60 g    Refill:  0    Order Specific Question:   Supervising Provider    Answer:   Raliegh Ip [[id number]]  . fluconazole (DIFLUCAN) 150 MG tablet    Sig: 1 po q week x 4 weeks    Dispense:  4 tablet    Refill:  0    Order Specific Question:   Supervising Provider    Answer:   Raliegh Ip [[id number]]    Prudy Feeler PA-C [facility] [phone]

## 2019-08-11 ENCOUNTER — Telehealth: Payer: Self-pay | Admitting: *Deleted

## 2019-08-11 NOTE — Telephone Encounter (Addendum)
Prior Auth for Nystatin 100000 unit/gram powder-APPROVED till 07/08/20  Key: [id number] -   PA Case ID: [id number]   Called [facility] in [place] and left message on provider VM advising PA for medication approved.

## 2019-08-24 ENCOUNTER — Other Ambulatory Visit: Payer: Self-pay | Admitting: Physician Assistant

## 2019-08-24 DIAGNOSIS — B356 Tinea cruris: Secondary | ICD-10-CM

## 2019-08-25 ENCOUNTER — Telehealth: Payer: Self-pay | Admitting: Family

## 2019-08-25 DIAGNOSIS — B356 Tinea cruris: Secondary | ICD-10-CM

## 2019-08-25 DIAGNOSIS — R21 Rash and other nonspecific skin eruption: Secondary | ICD-10-CM

## 2019-08-25 MED ORDER — CLOTRIMAZOLE 1 % EX CREA: 1.0000 "application " | TOPICAL_CREAM | Freq: Two times a day (BID) | CUTANEOUS | 1 refills | Status: DC

## 2019-08-25 NOTE — Telephone Encounter (Signed)
Refill sent in.  Tried to call patient to inform, no answer.

## 2019-08-25 NOTE — Telephone Encounter (Signed)
What is the name of the medication? clotrimazole (LOTRIMIN) 1 % cream  Have you contacted your pharmacy to request a refill? no  Which pharmacy would you [facility] to[facility]   Patient notified that their request is being sent to the clinical staff for review and that they should receive a call once it is complete. If they do not receive a call within 24 hours they can check with their pharmacy or our office.

## 2019-11-24 ENCOUNTER — Telehealth: Payer: Self-pay

## 2019-11-24 ENCOUNTER — Ambulatory Visit (INDEPENDENT_AMBULATORY_CARE_PROVIDER_SITE_OTHER): Payer: Medicare HMO

## 2019-11-24 DIAGNOSIS — Z0000 Encounter for general adult medical examination without abnormal findings: Secondary | ICD-10-CM

## 2019-11-24 MED ORDER — DICLOFENAC SODIUM 1 % EX GEL: 2.0000 g | Freq: Four times a day (QID) | CUTANEOUS | 2 refills | Status: DC

## 2019-11-24 NOTE — Telephone Encounter (Signed)
Voltaren gel Prescription sent to pharmacy   

## 2019-11-24 NOTE — Progress Notes (Addendum)
MEDICARE ANNUAL WELLNESS VISIT  11/24/2019  Telephone Visit Disclaimer This Medicare AWV was conducted by telephone due to national recommendations for restrictions regarding the COVID-19 Pandemic (e.g. social distancing).  I verified, using two identifiers, that I am speaking with Victor Shields or their authorized healthcare agent. I discussed the limitations, risks, security, and privacy concerns of performing an evaluation and management service by telephone and the potential availability of an in-person appointment in the future. The patient expressed understanding and agreed to proceed.   Subjective:  Victor Shields is a 69 y.o. male patient of [name], Edilia Bo, FNP who had a Medicare Annual Wellness Visit today via telephone. Victor Shields is Retired and lives with their spouse. he has one child. he reports that he is socially active and does interact with friends/family regularly. he is minimally physically active and enjoys reading.  Patient Care Team: Junie Spencer, FNP as PCP - General (Nurse Practitioner)  Advanced Directives 11/24/2019 11/13/2018 02/04/2018  Does Patient Have a Medical Advance Directive? No No No  Would patient like information on creating a medical advance directive? No - Patient declined Yes (MAU/Ambulatory/Procedural Areas - Information given) -    Hospital Utilization Over the Past 12 Months: # of hospitalizations or ER visits: 0 # of surgeries: 0  Review of Systems    Patient reports that his overall health is unchanged compared to last year.  Negative except Bilateral hand pain due to arthritis. Phone note sent to [facility] for review.  Patient Reported Readings (BP, Pulse, CBG, Weight, etc) none  Pain Assessment Pain : 0-10 Pain Score: 5  Pain Type: Chronic pain Pain Location: Hand Pain Orientation: Lateral Pain Descriptors / Indicators: Aching Pain Onset: More than a month ago Pain Frequency: Constant Pain Relieving Factors:  Aleve  Pain Relieving Factors: Aleve  Current Medications & Allergies (verified) Allergies as of 11/24/2019       Reactions   Penicillins Swelling        Medication List        Accurate as of Nov 24, 2019  2:59 PM. If you have any questions, ask your nurse or doctor.          STOP taking these medications    fluconazole 150 MG tablet Commonly known as: DIFLUCAN       TAKE these medications    albuterol 108 (90 Base) MCG/ACT inhaler Commonly known as: VENTOLIN HFA Inhale 2 puffs into the lungs every 6 (six) hours as needed for wheezing or shortness of breath.   budesonide-formoterol 80-4.5 MCG/ACT inhaler Commonly known as: SYMBICORT Inhale 2 puffs into the lungs 2 (two) times daily.   clotrimazole 1 % cream Commonly known as: LOTRIMIN Apply 1 application topically 2 (two) times daily. What changed:  when to take this reasons to take this   naproxen sodium 220 MG tablet Commonly known as: ALEVE Take 220 mg by mouth daily as needed.   nystatin powder Commonly known as: MYCOSTATIN/NYSTOP Apply 1 application topically 3 (three) times daily. What changed:  when to take this reasons to take this   trolamine salicylate 10 % cream Commonly known as: ASPERCREME Apply 1 application topically as needed for muscle pain.        History (reviewed): Past Medical History:  Diagnosis Date   Gout    Past Surgical History:  Procedure Laterality Date   CATARACT EXTRACTION     No family history on file. Social History   Socioeconomic History  Marital status: Married    Spouse name: [name]   Number of children: 1   Years of education: Not on file   Highest education level: 9th grade  Occupational History   Occupation: Retired    Associate Professor: [employer]  Tobacco Use   Smoking status: Current Every Day Smoker    Packs/day: 1.00    Years: 40.00    Pack years: 40.00    Types: Cigarettes    Start date: 07/10/1975   Smokeless tobacco: Never  Used  Substance and Sexual Activity   Alcohol use: Yes    Alcohol/week: 24.0 standard drinks    Types: 24 Standard drinks or equivalent per week    Comment: "couple of beers daily"   Drug use: No   Sexual activity: Not Currently  Other Topics Concern   Not on file  Social History Narrative   Not on file   Social Determinants of Health   Financial Resource Strain:    Difficulty of Paying Living Expenses:   Food Insecurity:    Worried About Programme researcher, broadcasting/film/video in the Last Year:    Barista in the Last Year:   Transportation Needs:    Freight forwarder (Medical):    Lack of Transportation (Non-Medical):   Physical Activity:    Days of Exercise per Week:    Minutes of Exercise per Session:   Stress:    Feeling of Stress :   Social Connections:    Frequency of Communication with Friends and Family:    Frequency of Social Gatherings with Friends and Family:    Attends Religious Services:    Active Member of Clubs or Organizations:    Attends Banker Meetings:    Marital Status:     Activities of Daily Living In your present state of health, do you have any difficulty performing the following activities: 11/24/2019  Hearing? Y  Vision? N  Difficulty concentrating or making decisions? Y  Walking or climbing stairs? Y  Dressing or bathing? N  Doing errands, shopping? N  Preparing Food and eating ? N  Using the Toilet? N  In the past six months, have you accidently leaked urine? N  Do you have problems with loss of bowel control? N  Managing your Medications? N  Managing your Finances? N  Housekeeping or managing your Housekeeping? N  Some recent data might be hidden    Patient Education/ Literacy How often do you need to have someone help you when you read instructions, pamphlets, or other written materials from your doctor or pharmacy?: 2 - Rarely What is the last grade level you completed in school?: 8th  Exercise Current Exercise Habits:  Home exercise routine, Type of exercise: strength training/weights, Time (Minutes): 15, Frequency (Times/Week): 3, Weekly Exercise (Minutes/Week): 45, Intensity: Mild, Exercise limited by: orthopedic condition(s)  Diet Patient reports consuming 3 meals a day and 2 snack(s) a day Patient reports that his primary diet is: Regular Patient reports that she does have regular access to food.   Depression Screen PHQ 2/9 Scores 11/24/2019 11/13/2018 03/20/2018 11/15/2017 12/25/2016  PHQ - 2 Score 0 0 0 0 0     Fall Risk Fall Risk  11/24/2019 11/13/2018 03/20/2018 12/25/2016  Falls in the past year? 0 0 Yes No  Number falls in past yr: - - 1 -  Injury with Fall? - - Yes -     Objective:  Victor Shields seemed alert and oriented and he participated  appropriately during our telephone visit.  Blood Pressure Weight BMI  BP Readings from Last 3 Encounters:  03/20/18 134/61  02/04/18 129/68  11/15/17 (!) 149/73   Wt Readings from Last 3 Encounters:  11/13/18 149 lb (67.6 kg)  03/20/18 149 lb (67.6 kg)  02/04/18 145 lb (65.8 kg)   BMI Readings from Last 1 Encounters:  11/13/18 22.66 kg/m    *Unable to obtain current vital signs, weight, and BMI due to telephone visit type  Hearing/Vision  Victor Shields did  seem to have difficulty with hearing/understanding during the telephone conversation Reports that he has not had a formal eye exam by an eye care professional within the past year Reports that he has not had a formal hearing evaluation within the past year *Unable to fully assess hearing and vision during telephone visit type  Cognitive Function: 6CIT Screen 11/24/2019 11/13/2018  What Year? 0 points 0 points  What month? 0 points 0 points  What time? 0 points 0 points  Count back from 20 0 points 0 points  Months in reverse 2 points 0 points  Repeat phrase 2 points 0 points  Total Score 4 0   (Normal:0-7, Significant for Dysfunction: >8)  Normal Cognitive Function Screening:  Yes   Immunization & Health Maintenance Record  There is no immunization history on file for this patient.  Health Maintenance  Topic Date Due   Hepatitis C Screening  Never done   COVID-19 Vaccine (1) Never done   TETANUS/TDAP  Never done   COLONOSCOPY  Never done   PNA vac Low Risk Adult (1 of 2 - PCV13) Never done   INFLUENZA VACCINE  02/07/2020       Assessment  This is a routine wellness examination for Victor Shields.  Health Maintenance: Due or Overdue Health Maintenance Due  Topic Date Due   Hepatitis C Screening  Never done   COVID-19 Vaccine (1) Never done   TETANUS/TDAP  Never done   COLONOSCOPY  Never done   PNA vac Low Risk Adult (1 of 2 - PCV13) Never done    Victor Shields does not need a referral for Community Assistance: Care Management:   no Social Work:    no Prescription Assistance:  no Nutrition/Diabetes Education:  no   Plan:  Personalized Goals Goals Addressed             This Visit's Progress    Exercise 150 min/wk Moderate Activity         Personalized Health Maintenance & Screening Recommendations   Lung Cancer Screening Recommended: YES  Current every day smoker (Low Dose CT Chest recommended if Age 28-80 years, 30 pack-year currently smoking OR have quit w/in past 15 years) Hepatitis C Screening recommended: no HIV Screening recommended: no  Advanced Directives: Written information was not prepared per patient's request.  Referrals & Orders No orders of the defined types were placed in this encounter.   Follow-up Plan Follow-up with Junie Spencer, FNP as planned Schedule 617/2021   I have personally reviewed and noted the following in the patient's chart:   Medical and social history Use of alcohol, tobacco or illicit drugs  Current medications and supplements Functional ability and status Nutritional status Physical activity Advanced directives List of other physicians Hospitalizations, surgeries,  and ER visits in previous 12 months Vitals Screenings to include cognitive, depression, and falls Referrals and appointments  In addition, I have reviewed and discussed with Victor Shields certain preventive protocols, quality metrics,  and best practice recommendations. A written personalized care plan for preventive services as well as general preventive health recommendations is available and can be mailed to the patient at his request.      Victor Shields [facility]  [id number]/8921     I have reviewed and agree with the above AWV documentation.   Evelina Dun, FNP

## 2019-11-24 NOTE — Patient Instructions (Addendum)
  MEDICARE ANNUAL WELLNESS VISIT Health Maintenance Summary and Written Plan of Care  Victor Shields ,  Thank you for allowing me to perform your Medicare Annual Wellness Visit and for your ongoing commitment to your health.   Health Maintenance & Immunization History Health Maintenance  Topic Date Due  . Hepatitis C Screening  Never done  . COVID-19 Vaccine (1) Never done  . TETANUS/TDAP  Never done  . COLONOSCOPY  Never done  . PNA vac Low Risk Adult (1 of 2 - PCV13) Never done  . INFLUENZA VACCINE  02/07/2020    There is no immunization history on file for this patient.  These are the patient goals that we discussed: Goals Addressed   None      This is a list of Health Maintenance Items that are overdue or due now: Health Maintenance Due  Topic Date Due  . Hepatitis C Screening  Never done  . COVID-19 Vaccine (1) Never done  . TETANUS/TDAP  Never done  . COLONOSCOPY  Never done  . PNA vac Low Risk Adult (1 of 2 - PCV13) Never done     Orders/Referrals Placed Today: No orders of the defined types were placed in this encounter.  (Contact our referral department at [phone] if you have not spoken with someone about your referral appointment within the next 5 days)    Follow-up Plan  Scheduled with Jannifer Rodney for a telephone visit on 12/24/2019 at 2:25pm.

## 2019-11-24 NOTE — Telephone Encounter (Signed)
Patient aware and verbalized understanding. °

## 2019-11-24 NOTE — Telephone Encounter (Signed)
Patient would like a pain relieving cream to rub on his hands. H/o of arthritis. Aleve does help some. Complains that hands ache.

## 2019-12-06 ENCOUNTER — Other Ambulatory Visit: Payer: Self-pay | Admitting: Family

## 2019-12-06 DIAGNOSIS — J42 Unspecified chronic bronchitis: Secondary | ICD-10-CM

## 2019-12-24 ENCOUNTER — Telehealth: Payer: Medicare HMO | Admitting: Family

## 2020-01-06 ENCOUNTER — Other Ambulatory Visit: Payer: Self-pay | Admitting: *Deleted

## 2020-01-06 DIAGNOSIS — B356 Tinea cruris: Secondary | ICD-10-CM

## 2020-01-06 MED ORDER — NYSTATIN 100000 UNIT/GM EX POWD: 1.0000 "application " | Freq: Three times a day (TID) | CUTANEOUS | 0 refills | Status: DC

## 2020-01-06 MED ORDER — FLUCONAZOLE 150 MG PO TABS: ORAL_TABLET | ORAL | 0 refills | Status: DC

## 2020-01-06 NOTE — Telephone Encounter (Signed)
May we refill these meds? Patient was prescribed these in Feb to treat tinea cruris.

## 2020-02-15 ENCOUNTER — Encounter (HOSPITAL_COMMUNITY): Payer: Self-pay | Admitting: *Deleted

## 2020-02-15 ENCOUNTER — Other Ambulatory Visit: Payer: Self-pay

## 2020-02-15 ENCOUNTER — Emergency Department (HOSPITAL_COMMUNITY): Payer: Medicare HMO

## 2020-02-15 ENCOUNTER — Inpatient Hospital Stay (HOSPITAL_COMMUNITY)
Admission: EM | Admit: 2020-02-15 | Discharge: 2020-02-22 | DRG: 482 | Disposition: A | Discharge status: Skilled Nursing Facility | Payer: Medicare HMO | Attending: Internal Medicine | Admitting: Internal Medicine

## 2020-02-15 DIAGNOSIS — K219 Gastro-esophageal reflux disease without esophagitis: Secondary | ICD-10-CM | POA: Diagnosis present

## 2020-02-15 DIAGNOSIS — J449 Chronic obstructive pulmonary disease, unspecified: Secondary | ICD-10-CM | POA: Diagnosis present

## 2020-02-15 DIAGNOSIS — M109 Gout, unspecified: Secondary | ICD-10-CM | POA: Diagnosis not present

## 2020-02-15 DIAGNOSIS — Y92018 Other place in single-family (private) house as the place of occurrence of the external cause: Secondary | ICD-10-CM

## 2020-02-15 DIAGNOSIS — Z720 Tobacco use: Secondary | ICD-10-CM

## 2020-02-15 DIAGNOSIS — S72145A Nondisplaced intertrochanteric fracture of left femur, initial encounter for closed fracture: Secondary | ICD-10-CM | POA: Diagnosis not present

## 2020-02-15 DIAGNOSIS — M25552 Pain in left hip: Secondary | ICD-10-CM | POA: Diagnosis not present

## 2020-02-15 DIAGNOSIS — Z79899 Other long term (current) drug therapy: Secondary | ICD-10-CM

## 2020-02-15 DIAGNOSIS — W19XXXA Unspecified fall, initial encounter: Secondary | ICD-10-CM | POA: Diagnosis not present

## 2020-02-15 DIAGNOSIS — Z880 Allergy status to penicillin: Secondary | ICD-10-CM | POA: Diagnosis not present

## 2020-02-15 DIAGNOSIS — Z7951 Long term (current) use of inhaled steroids: Secondary | ICD-10-CM | POA: Diagnosis not present

## 2020-02-15 DIAGNOSIS — R609 Edema, unspecified: Secondary | ICD-10-CM | POA: Diagnosis not present

## 2020-02-15 DIAGNOSIS — J438 Other emphysema: Secondary | ICD-10-CM | POA: Diagnosis not present

## 2020-02-15 DIAGNOSIS — S72002A Fracture of unspecified part of neck of left femur, initial encounter for closed fracture: Secondary | ICD-10-CM | POA: Diagnosis not present

## 2020-02-15 DIAGNOSIS — S72002K Fracture of unspecified part of neck of left femur, subsequent encounter for closed fracture with nonunion: Secondary | ICD-10-CM | POA: Diagnosis not present

## 2020-02-15 DIAGNOSIS — W010XXA Fall on same level from slipping, tripping and stumbling without subsequent striking against object, initial encounter: Secondary | ICD-10-CM | POA: Diagnosis present

## 2020-02-15 DIAGNOSIS — M6281 Muscle weakness (generalized): Secondary | ICD-10-CM | POA: Diagnosis not present

## 2020-02-15 DIAGNOSIS — Z20822 Contact with and (suspected) exposure to covid-19: Secondary | ICD-10-CM | POA: Diagnosis not present

## 2020-02-15 DIAGNOSIS — R739 Hyperglycemia, unspecified: Secondary | ICD-10-CM | POA: Diagnosis present

## 2020-02-15 DIAGNOSIS — R41841 Cognitive communication deficit: Secondary | ICD-10-CM | POA: Diagnosis not present

## 2020-02-15 DIAGNOSIS — J441 Chronic obstructive pulmonary disease with (acute) exacerbation: Secondary | ICD-10-CM | POA: Diagnosis present

## 2020-02-15 DIAGNOSIS — E876 Hypokalemia: Secondary | ICD-10-CM | POA: Diagnosis not present

## 2020-02-15 DIAGNOSIS — M792 Neuralgia and neuritis, unspecified: Secondary | ICD-10-CM | POA: Diagnosis not present

## 2020-02-15 DIAGNOSIS — R42 Dizziness and giddiness: Secondary | ICD-10-CM | POA: Diagnosis not present

## 2020-02-15 DIAGNOSIS — R52 Pain, unspecified: Secondary | ICD-10-CM | POA: Diagnosis not present

## 2020-02-15 DIAGNOSIS — F17210 Nicotine dependence, cigarettes, uncomplicated: Secondary | ICD-10-CM | POA: Diagnosis present

## 2020-02-15 DIAGNOSIS — M1990 Unspecified osteoarthritis, unspecified site: Secondary | ICD-10-CM | POA: Diagnosis not present

## 2020-02-15 DIAGNOSIS — E559 Vitamin D deficiency, unspecified: Secondary | ICD-10-CM | POA: Diagnosis present

## 2020-02-15 DIAGNOSIS — S72142A Displaced intertrochanteric fracture of left femur, initial encounter for closed fracture: Secondary | ICD-10-CM | POA: Diagnosis not present

## 2020-02-15 DIAGNOSIS — W19XXXD Unspecified fall, subsequent encounter: Secondary | ICD-10-CM | POA: Diagnosis not present

## 2020-02-15 DIAGNOSIS — Z9181 History of falling: Secondary | ICD-10-CM | POA: Diagnosis not present

## 2020-02-15 DIAGNOSIS — R0902 Hypoxemia: Secondary | ICD-10-CM | POA: Diagnosis not present

## 2020-02-15 DIAGNOSIS — R2689 Other abnormalities of gait and mobility: Secondary | ICD-10-CM | POA: Diagnosis not present

## 2020-02-15 DIAGNOSIS — Z01818 Encounter for other preprocedural examination: Secondary | ICD-10-CM | POA: Diagnosis not present

## 2020-02-15 DIAGNOSIS — R600 Localized edema: Secondary | ICD-10-CM

## 2020-02-15 DIAGNOSIS — S72002D Fracture of unspecified part of neck of left femur, subsequent encounter for closed fracture with routine healing: Secondary | ICD-10-CM | POA: Diagnosis not present

## 2020-02-15 DIAGNOSIS — S72002G Fracture of unspecified part of neck of left femur, subsequent encounter for closed fracture with delayed healing: Secondary | ICD-10-CM | POA: Diagnosis not present

## 2020-02-15 LAB — VITAMIN B12: Vitamin B-12: 543 pg/mL (ref 180–914)

## 2020-02-15 LAB — HEMOGLOBIN A1C
Hgb A1c MFr Bld: 4.5 % — ABNORMAL LOW (ref 4.8–5.6)
Mean Plasma Glucose: 82.45 mg/dL

## 2020-02-15 LAB — BASIC METABOLIC PANEL
Anion gap: 8 (ref 5–15)
BUN: 10 mg/dL (ref 8–23)
CO2: 26 mmol/L (ref 22–32)
Calcium: 8.4 mg/dL — ABNORMAL LOW (ref 8.9–10.3)
Chloride: 104 mmol/L (ref 98–111)
Creatinine, Ser: 0.57 mg/dL — ABNORMAL LOW (ref 0.61–1.24)
GFR calc Af Amer: >60 mL/min (ref >60)
GFR calc non Af Amer: >60 mL/min (ref >60)
Glucose, Bld: 148 mg/dL — ABNORMAL HIGH (ref 70–99)
Potassium: 4.3 mmol/L (ref 3.5–5.1)
Sodium: 138 mmol/L (ref 135–145)

## 2020-02-15 LAB — CBC WITH DIFFERENTIAL/PLATELET
Abs Immature Granulocytes: 0.03 10*3/uL (ref 0.00–0.07)
Basophils Absolute: 0 10*3/uL (ref 0.0–0.1)
Basophils Relative: 1 %
Eosinophils Absolute: 0 10*3/uL (ref 0.0–0.5)
Eosinophils Relative: 1 %
HCT: 38.8 % — ABNORMAL LOW (ref 39.0–52.0)
Hemoglobin: 12.8 g/dL — ABNORMAL LOW (ref 13.0–17.0)
Immature Granulocytes: 1 %
Lymphocytes Relative: 14 %
Lymphs Abs: 0.9 10*3/uL (ref 0.7–4.0)
MCH: 32.2 pg (ref 26.0–34.0)
MCHC: 33 g/dL (ref 30.0–36.0)
MCV: 97.5 fL (ref 80.0–100.0)
Monocytes Absolute: 0.8 10*3/uL (ref 0.1–1.0)
Monocytes Relative: 13 %
Neutro Abs: 4.4 10*3/uL (ref 1.7–7.7)
Neutrophils Relative %: 70 %
Platelets: 185 10*3/uL (ref 150–400)
RBC: 3.98 MIL/uL — ABNORMAL LOW (ref 4.22–5.81)
RDW: 12.8 % (ref 11.5–15.5)
WBC: 6.2 10*3/uL (ref 4.0–10.5)
nRBC: 0 % (ref 0.0–0.2)

## 2020-02-15 LAB — VITAMIN D 25 HYDROXY (VIT D DEFICIENCY, FRACTURES): Vit D, 25-Hydroxy: 19.72 ng/mL — ABNORMAL LOW (ref 30–100)

## 2020-02-15 LAB — TYPE AND SCREEN
ABO/RH(D): A POS
Antibody Screen: NEGATIVE

## 2020-02-15 LAB — TSH: TSH: 1.682 u[IU]/mL (ref 0.350–4.500)

## 2020-02-15 LAB — PROTIME-INR
INR: 1.2 (ref 0.8–1.2)
Prothrombin Time: 14.7 seconds (ref 11.4–15.2)

## 2020-02-15 LAB — SARS CORONAVIRUS 2 BY RT PCR (HOSPITAL ORDER, PERFORMED IN ~~LOC~~ HOSPITAL LAB): SARS Coronavirus 2: NEGATIVE

## 2020-02-15 LAB — HIV ANTIBODY (ROUTINE TESTING W REFLEX): HIV Screen 4th Generation wRfx: NONREACTIVE

## 2020-02-15 MED ORDER — DOCUSATE SODIUM 100 MG PO CAPS
100.0000 mg | ORAL_CAPSULE | Freq: Two times a day (BID) | ORAL | Status: DC
  Administered 2020-02-15 – 2020-02-20 (×8): 100 mg via ORAL
  Filled 2020-02-15 (×13): qty 1

## 2020-02-15 MED ORDER — FENTANYL CITRATE (PF) 100 MCG/2ML IJ SOLN
50.0000 ug | Freq: Once | INTRAMUSCULAR | Status: AC
  Administered 2020-02-15: 50 ug via INTRAVENOUS
  Filled 2020-02-15: qty 2

## 2020-02-15 MED ORDER — MOMETASONE FURO-FORMOTEROL FUM 100-5 MCG/ACT IN AERO
2.0000 | INHALATION_SPRAY | Freq: Two times a day (BID) | RESPIRATORY_TRACT | Status: DC
  Administered 2020-02-15 – 2020-02-22 (×12): 2 via RESPIRATORY_TRACT
  Filled 2020-02-15: qty 8.8

## 2020-02-15 MED ORDER — NICOTINE 21 MG/24HR TD PT24
21.0000 mg | MEDICATED_PATCH | Freq: Every day | TRANSDERMAL | Status: DC
  Administered 2020-02-20 – 2020-02-22 (×3): 21 mg via TRANSDERMAL
  Filled 2020-02-15 (×6): qty 1

## 2020-02-15 MED ORDER — MORPHINE SULFATE (PF) 2 MG/ML IV SOLN
2.0000 mg | INTRAVENOUS | Status: DC | PRN
  Administered 2020-02-15 – 2020-02-21 (×3): 2 mg via INTRAVENOUS
  Filled 2020-02-15 (×3): qty 1

## 2020-02-15 MED ORDER — MORPHINE SULFATE (PF) 2 MG/ML IV SOLN
0.5000 mg | INTRAVENOUS | Status: DC | PRN
  Administered 2020-02-15: 0.5 mg via INTRAVENOUS
  Filled 2020-02-15: qty 1

## 2020-02-15 MED ORDER — FENTANYL CITRATE (PF) 100 MCG/2ML IJ SOLN
50.0000 ug | INTRAMUSCULAR | Status: DC | PRN
  Administered 2020-02-15: 50 ug via INTRAVENOUS
  Filled 2020-02-15: qty 2

## 2020-02-15 MED ORDER — HEPARIN SODIUM (PORCINE) 5000 UNIT/ML IJ SOLN
5000.0000 [IU] | Freq: Three times a day (TID) | INTRAMUSCULAR | Status: DC
  Administered 2020-02-15 – 2020-02-17 (×5): 5000 [IU] via SUBCUTANEOUS
  Filled 2020-02-15 (×6): qty 1

## 2020-02-15 MED ORDER — METHOCARBAMOL 1000 MG/10ML IJ SOLN
500.0000 mg | Freq: Four times a day (QID) | INTRAVENOUS | Status: DC | PRN
  Filled 2020-02-15: qty 5

## 2020-02-15 MED ORDER — SODIUM CHLORIDE 0.9 % IV BOLUS
500.0000 mL | Freq: Once | INTRAVENOUS | Status: AC
  Administered 2020-02-15: 500 mL via INTRAVENOUS

## 2020-02-15 MED ORDER — METHOCARBAMOL 500 MG PO TABS
500.0000 mg | ORAL_TABLET | Freq: Four times a day (QID) | ORAL | Status: DC | PRN
  Administered 2020-02-15 – 2020-02-20 (×3): 500 mg via ORAL
  Filled 2020-02-15 (×3): qty 1

## 2020-02-15 MED ORDER — SODIUM CHLORIDE 0.9 % IV SOLN: INTRAVENOUS | Status: AC

## 2020-02-15 MED ORDER — IPRATROPIUM-ALBUTEROL 0.5-2.5 (3) MG/3ML IN SOLN: 3.0000 mL | Freq: Four times a day (QID) | RESPIRATORY_TRACT | Status: DC | PRN

## 2020-02-15 MED ORDER — HYDROCODONE-ACETAMINOPHEN 5-325 MG PO TABS
1.0000 | ORAL_TABLET | Freq: Four times a day (QID) | ORAL | Status: DC | PRN
  Administered 2020-02-16 – 2020-02-17 (×3): 2 via ORAL
  Administered 2020-02-19 – 2020-02-21 (×4): 1 via ORAL
  Filled 2020-02-15 (×3): qty 2
  Filled 2020-02-15 (×2): qty 1
  Filled 2020-02-15 (×2): qty 2
  Filled 2020-02-15 (×2): qty 1

## 2020-02-15 NOTE — ED Provider Notes (Signed)
Emergency Department Provider Note   I have reviewed the triage vital signs and the nursing notes.   HISTORY  Chief Complaint Fall   HPI Victor Shields is a 69 y.o. male with past medical history reviewed below, not anticoagulated, presents to the emergency department after falling at his home today.  He is experiencing left hip pain.  Patient was using a rolling walker with a seat to go out on the back porch and smoke a cigarette.  He was sitting down on the walker when he states he felt slightly lightheaded and slipped out of the chair.  He fell to the ground but did not strike his head.  His wife came to assist and found him to be in significant pain.  She was unable to get him up off the floor due to his level of pain and thus called EMS.  He denies any chest pain, shortness of breath, heart palpitations prior to falling.  Denies any fevers or chills.  No abdominal pain.  He is mainly having pain in the left hip worse with movement. No radiation of symptoms or modifying factors.   Past Medical History:  Diagnosis Date  . Gout     Patient Active Problem List   Diagnosis Date Noted  . Chronic bronchitis (HCC) 05/28/2019  . Current smoker 05/28/2019  . Bilateral wheezing 03/20/2018  . Closed fracture of surgical neck of humerus 03/11/2018    Past Surgical History:  Procedure Laterality Date  . CATARACT EXTRACTION      Allergies Penicillins  History reviewed. No pertinent family history.  Social History Social History   Tobacco Use  . Smoking status: Current Every Day Smoker    Packs/day: 1.00    Years: 40.00    Pack years: 40.00    Types: Cigarettes    Start date: 07/10/1975  . Smokeless tobacco: Never Used  Vaping Use  . Vaping Use: Never used  Substance Use Topics  . Alcohol use: Yes    Alcohol/week: 24.0 standard drinks    Types: 24 Standard drinks or equivalent per week    Comment: "couple of beers daily"  . Drug use: No    Review of  Systems  Constitutional: No fever/chills Eyes: No visual changes. ENT: No sore throat. Cardiovascular: Denies chest pain. Episode of lightheadedness.  Respiratory: Denies shortness of breath. Gastrointestinal: No abdominal pain.  No nausea, no vomiting.  No diarrhea.  No constipation. Genitourinary: Negative for dysuria. Musculoskeletal: Positive left hip pain.  Skin: Negative for rash. Neurological: Negative for headaches, focal weakness or numbness.  10-point ROS otherwise negative.  ____________________________________________   PHYSICAL EXAM:  VITAL SIGNS: ED Triage Vitals [02/15/20 0907]  Enc Vitals Group     BP (!) 146/60     Pulse Rate 84     Resp 16     Temp 97.7 F (36.5 C)     Temp Source Oral     SpO2 99 %     Weight 165 lb (74.8 kg)     Height 5\' 8"  (1.727 m)   Constitutional: Alert and oriented. Well appearing and in no acute distress. Eyes: Conjunctivae are normal. PERRL. Head: Atraumatic. Nose: No congestion/rhinnorhea. Mouth/Throat: Mucous membranes are moist.   Neck: No stridor. No cervical spine tenderness to palpation. Cardiovascular: Normal rate, regular rhythm. Good peripheral circulation. Grossly normal heart sounds.   Respiratory: Normal respiratory effort.  No retractions. Lungs CTAB. Gastrointestinal: Soft and nontender. No distention.  Musculoskeletal: Left lower extremity is shortened  and externally rotated.  Pain with passive range of motion of the left hip.  No tenderness to palpation of the left knee or ankle.  Normal range of motion of the remaining extremities. No midline thoracic or lumbar spine.  Neurologic:  Normal speech and language. No gross focal neurologic deficits are appreciated.  Skin:  Skin is warm, dry and intact. No rash noted.   ____________________________________________   LABS (all labs ordered are listed, but only abnormal results are displayed)  Labs Reviewed  BASIC METABOLIC PANEL - Abnormal; Notable for the  following components:      Result Value   Glucose, Bld 148 (*)    Creatinine, Ser 0.57 (*)    Calcium 8.4 (*)    All other components within normal limits  CBC WITH DIFFERENTIAL/PLATELET - Abnormal; Notable for the following components:   RBC 3.98 (*)    Hemoglobin 12.8 (*)    HCT 38.8 (*)    All other components within normal limits  SARS CORONAVIRUS 2 BY RT PCR [facility] ORDER, PERFORMED IN [facility])  PROTIME-INR  TYPE AND SCREEN   ____________________________________________  EKG   EKG Interpretation  Date/Time:  Monday February 15 2020 10:02:49 EDT Ventricular Rate:  83 PR Interval:    QRS Duration: 102 QT Interval:  398 QTC Calculation: 468 R Axis:   71 Text Interpretation: Sinus rhythm Low voltage, precordial leads Nonspecific T abnormalities, lateral leads No STEMI Confirmed by Alona Bene [id number]) on 02/15/2020 10:17:11 AM       ____________________________________________  RADIOLOGY  DG Chest Portable 1 View  Result Date: 02/15/2020 CLINICAL DATA:  Preop hip fracture. EXAM: PORTABLE CHEST 1 VIEW COMPARISON:  None. FINDINGS: Trachea is midline. Heart size normal. Thoracic aorta is calcified. Lungs are hyperinflated. Small right pleural effusion or pleural thickening. Lungs are otherwise grossly clear. IMPRESSION: 1. Small right pleural effusion or pleural thickening. 2. Hyperinflation. 3.  Aortic atherosclerosis (ICD10-I70.0). Electronically Signed   By: Leanna Battles M.D.   On: 02/15/2020 10:41   DG Hip Unilat W or Wo Pelvis 2-3 Views Left  Result Date: 02/15/2020 CLINICAL DATA:  Larey Seat 3 weeks ago and again today. Left hip pain. EXAM: DG HIP (WITH OR WITHOUT PELVIS) 2-3V LEFT COMPARISON:  None. FINDINGS: There is an impacted and possibly subacute intertrochanteric fracture of the left hip. The right hip is intact. The pubic symphysis and SI joints are intact. No pelvic fractures. Extensive vascular calcifications. IMPRESSION: Impacted acute or subacute  intertrochanteric fracture of the left hip. Electronically Signed   By: Rudie Meyer M.D.   On: 02/15/2020 10:06    ____________________________________________   PROCEDURES  Procedure(s) performed:   Procedures  None ____________________________________________   INITIAL IMPRESSION / ASSESSMENT AND PLAN / ED COURSE  Pertinent labs & imaging results that were available during my care of the patient were reviewed by me and considered in my medical decision making (see chart for details).   Patient presents to the emergency department with left hip pain after sliding out of his chair today.  No head trauma.  Plain film of the left hip reviewed showing impacted intertrochanteric fracture of the left hip.  Radiology describing this as acute or subacute.  Patient did have a fall in June with some lingering left hip pain but became more severe today after falling again.  Will reach out to orthopedics on call as patient does not have a primary orthopedist and would prefer to stay in [place].   Spoke with Dr.  Romeo Apple who will consult today. Plan for [facility] admit.   Discussed patient's case with [facility] to request admission. Patient and family (if present) updated with plan. Care transferred to [facility] service.  I reviewed all nursing notes, vitals, pertinent old records, EKGs, labs, imaging (as available).  ____________________________________________  FINAL CLINICAL IMPRESSION(S) / ED DIAGNOSES  Final diagnoses:  Fall, initial encounter  Closed fracture of left hip, initial encounter [facility])    MEDICATIONS GIVEN DURING THIS VISIT:  Medications  fentaNYL (SUBLIMAZE) injection 50 mcg (50 mcg Intravenous Given 02/15/20 1157)  sodium chloride 0.9 % bolus 500 mL (0 mLs Intravenous Stopped 02/15/20 1101)  fentaNYL (SUBLIMAZE) injection 50 mcg (50 mcg Intravenous Given 02/15/20 1012)    Note:  This document was prepared using Dragon voice recognition software and may include unintentional dictation  errors.  Alona Bene, MD, [facility] Emergency Medicine    Zyon Grout, Arlyss Repress, MD 02/15/20 [phone]

## 2020-02-15 NOTE — H&P (Signed)
History and Physical    Victor Shields EGB:[id number] DOB: [birth year] DOA: 02/15/2020  PCP: Junie Spencer, FNP   Patient coming from: home  I have personally briefly reviewed patient's old medical records in [facility]  Chief Complaint: fall and left hip pain  HPI: Victor Shields is a 69 y.o. male with medical history significant of COPD, tobacco abuse (ongoing) and gout arthritis; who presented to the hospital after experiencing fall at home and been unable to stand up and bear weight on his left hip. Patient denies LOC, but expressed feeling mildly lightheaded prior to falling. He normally uses a rolling walker for ambulation and expressed having previous fall approx 1 week prior to this one after tripping while walking. Patient denies fever, chills, SOB, nausea, vomiting, melena, dysuria, hematuria, hematochezia and any other complaints.  His pain was 8/10 in intensity localized in his left hip, no radiated and worsen with any movement or attempt to bear weight.   ED Course: COVID negative, no acute ischemic changes on EKG, overall stable blood work for him and with hip images demonstrating closed intertrochanteric left hip fracture. Orthopedic surgery consulted and planning surgical repair. TRH called to admit patient for further evaluation and management    Review of Systems: As per HPI otherwise all other systems reviewed and are negative.   Past Medical History:  Diagnosis Date  . Gout     Past Surgical History:  Procedure Laterality Date  . CATARACT EXTRACTION      Social History  reports that he has been smoking cigarettes. He started smoking about 44 years ago. He has a 40.00 pack-year smoking history. He has never used smokeless tobacco. He reports current alcohol use of about 24.0 standard drinks of alcohol per week. He reports that he does not use drugs.  Allergies  Allergen Reactions  . Penicillins Swelling    Family history: Patient denies any  medical problems on his family members except for HTN>  Prior to Admission medications   Medication Sig Start Date End Date Taking? Authorizing Provider  albuterol (VENTOLIN HFA) 108 (90 Base) MCG/ACT inhaler TAKE 2 PUFFS BY MOUTH EVERY 6 HOURS AS NEEDED FOR WHEEZE OR SHORTNESS OF BREATH 12/08/19  Yes [name], Christy A, FNP  budesonide-formoterol (SYMBICORT) 80-4.5 MCG/ACT inhaler Inhale 2 puffs into the lungs 2 (two) times daily. 05/28/19  Yes [name], Christy A, FNP  clotrimazole (LOTRIMIN) 1 % cream Apply 1 application topically 2 (two) times daily. Patient taking differently: Apply 1 application topically as needed.  08/25/19  Yes Remus Loffler, PA-C  diclofenac Sodium (VOLTAREN) 1 % GEL Apply 2 g topically 4 (four) times daily. 11/24/19  Yes [name], Christy A, FNP  naproxen sodium (ALEVE) 220 MG tablet Take 220 mg by mouth daily as needed.   Yes [provider]  nystatin (MYCOSTATIN/NYSTOP) powder Apply 1 application topically 3 (three) times daily. 01/06/20  Yes [name], Christy A, FNP  trolamine salicylate (ASPERCREME) 10 % cream Apply 1 application topically as needed for muscle pain.   Yes [provider]  fluconazole (DIFLUCAN) 150 MG tablet TAKE 1 TABLET BY MOUTH WEEKLY FOR 4 WEEKS Patient not taking: Reported on 02/15/2020 01/06/20   Junie Spencer, FNP    Physical Exam: Vitals:   02/15/20 1057 02/15/20 1100 02/15/20 1130 02/15/20 1200  BP: 123/64 134/67 115/64 (!) 116/57  Pulse: 89 89 90 87  Resp: 16 13 16 14   Temp:      TempSrc:  SpO2: 99% 97% 97% 96%  Weight:      Height:        Constitutional: in mild to moderate distress secondary to hip pain. AAOX3, no febrile. Vitals:   02/15/20 1057 02/15/20 1100 02/15/20 1130 02/15/20 1200  BP: 123/64 134/67 115/64 (!) 116/57  Pulse: 89 89 90 87  Resp: 16 13 16 14   Temp:      TempSrc:      SpO2: 99% 97% 97% 96%  Weight:      Height:       Eyes: PERRL, lids and conjunctivae normal, no icterus, no  nystagmus ENMT: Mucous membranes are slightly dry. Posterior pharynx clear of any exudate or lesions. Neck: normal, supple, no masses, no thyromegaly, no JVD. Respiratory: good air movement bilaterally, no wheezing, no crackles. Positive rhonchi diffusely. Cardiovascular: Regular rate and rhythm, no murmurs / rubs / gallops. No edema, no bruits. Abdomen: No tenderness, no masses palpated. No hepatosplenomegaly. Bowel sounds positive.  Musculoskeletal: no clubbing / no cyanosis. Left leg is shorter and externally rotated, good pulses felt and normal temp. Skin: no rashes, no petechiae. Neurologic: CN 2-12 grossly intact. No focal neurologic deficit appreciated. Psychiatric: Normal judgment and insight. Alert and oriented x 3. Normal mood.    Labs on Admission: I have personally reviewed following labs and imaging studies  CBC: Recent Labs  Lab 02/15/20 1024  WBC 6.2  NEUTROABS 4.4  HGB 12.8*  HCT 38.8*  MCV 97.5  PLT 185    Basic Metabolic Panel: Recent Labs  Lab 02/15/20 1024  NA 138  K 4.3  CL 104  CO2 26  GLUCOSE 148*  BUN 10  CREATININE 0.57*  CALCIUM 8.4*    GFR: Estimated Creatinine Clearance: 83.1 mL/min (A) (by C-G formula based on SCr of 0.57 mg/dL (L)).  Radiological Exams on Admission: DG Chest Portable 1 View  Result Date: 02/15/2020 CLINICAL DATA:  Preop hip fracture. EXAM: PORTABLE CHEST 1 VIEW COMPARISON:  None. FINDINGS: Trachea is midline. Heart size normal. Thoracic aorta is calcified. Lungs are hyperinflated. Small right pleural effusion or pleural thickening. Lungs are otherwise grossly clear. IMPRESSION: 1. Small right pleural effusion or pleural thickening. 2. Hyperinflation. 3.  Aortic atherosclerosis (ICD10-I70.0). Electronically Signed   By: 04/16/2020 M.D.   On: 02/15/2020 10:41   DG Hip Unilat W or Wo Pelvis 2-3 Views Left  Result Date: 02/15/2020 CLINICAL DATA:  04/16/2020 3 weeks ago and again today. Left hip pain. EXAM: DG HIP (WITH OR  WITHOUT PELVIS) 2-3V LEFT COMPARISON:  None. FINDINGS: There is an impacted and possibly subacute intertrochanteric fracture of the left hip. The right hip is intact. The pubic symphysis and SI joints are intact. No pelvic fractures. Extensive vascular calcifications. IMPRESSION: Impacted acute or subacute intertrochanteric fracture of the left hip. Electronically Signed   By: Larey Seat M.D.   On: 02/15/2020 10:06    EKG: Independently reviewed. No signs of acute ischemia. Sinus rhythm.  Assessment/Plan 1-Closed left hip fracture (HCC) -ortho contacted and planning on surgery most likely on Wednesday. -will follow hip fx protocol -PRN analgesics and muscle relaxants will be provided -will check Vit D level and use heparin for DVT prophylaxis -PT/OT evaluation after surgery to determine discharge pathway.   2-Tobacco abuse -cessation counseling provided -nicotine patch ordered  3-hx of Gout -no acute flare appreciated -will continue PRN analgesics  4-hx of COPD -no wheezing currently -PRN albuterol provided -will continue dulera -good O2 sat appreciated.  5-hyperglycemia -no prior  hx of Diabetes present. -will check A1C  6-Falls -will check Vit D level, Vit B12 and TSH -once able to stand up will also check orthostatic VS.  DVT prophylaxis: heparin Code Status:   Full code Family Communication:  No family at bedside. Disposition Plan:   Patient is from:  home  Anticipated DC to:  To be determined  Anticipated DC date:  To be determined.  Anticipated DC barriers: Hip fx surgical repair and PT/OT evaluation. Consults called:  Dr. Romeo Apple (orthopedic service) Admission status:  Inpatient, LOS > 2 midnights, med-surg bed.  Severity of Illness: Moderate severity, will need inpatient hospitalization for hip fracture repair and further evaluation.    Vassie Loll MD [facility]  How to contact the [facility] Attending or Consulting provider 7A - 7P or covering  provider during after hours 7P -7A, for this patient?   1. Check the care team in [facility] and look for a) attending/consulting TRH provider listed and b) the [facility] team listed 2. Log into [email or web address] and use [facility]'s universal password to access. If you do not have the password, please contact the hospital operator. 3. Locate the [facility], The provider you are looking for under [facility] and page to a number that you can be directly reached. 4. If you still have difficulty reaching the provider, please page the [facility] (Director on Call) for the Hospitalists listed on amion for assistance.  02/15/2020, 1:30 PM

## 2020-02-15 NOTE — ED Triage Notes (Signed)
Pt brought in by [facility] for c/o of sliding out of his chair and left hip pain; pt fell x 2 weeks ago and ems came and helped pt up but pt refused transport; pt has been using a walker since that day and states his hip has not been the same since that initial fall; pt was given fentanyl and zofran en route by ems

## 2020-02-16 DIAGNOSIS — S72002D Fracture of unspecified part of neck of left femur, subsequent encounter for closed fracture with routine healing: Secondary | ICD-10-CM | POA: Diagnosis not present

## 2020-02-16 DIAGNOSIS — J449 Chronic obstructive pulmonary disease, unspecified: Secondary | ICD-10-CM | POA: Diagnosis not present

## 2020-02-16 DIAGNOSIS — S72145A Nondisplaced intertrochanteric fracture of left femur, initial encounter for closed fracture: Secondary | ICD-10-CM

## 2020-02-16 DIAGNOSIS — E559 Vitamin D deficiency, unspecified: Secondary | ICD-10-CM

## 2020-02-16 DIAGNOSIS — R739 Hyperglycemia, unspecified: Secondary | ICD-10-CM | POA: Diagnosis not present

## 2020-02-16 DIAGNOSIS — S72002A Fracture of unspecified part of neck of left femur, initial encounter for closed fracture: Secondary | ICD-10-CM | POA: Diagnosis not present

## 2020-02-16 DIAGNOSIS — Z720 Tobacco use: Secondary | ICD-10-CM | POA: Diagnosis not present

## 2020-02-16 LAB — GLUCOSE, CAPILLARY: Glucose-Capillary: 122 mg/dL — ABNORMAL HIGH (ref 70–99)

## 2020-02-16 LAB — CBC
HCT: 36.1 % — ABNORMAL LOW (ref 39.0–52.0)
Hemoglobin: 11.6 g/dL — ABNORMAL LOW (ref 13.0–17.0)
MCH: 31.7 pg (ref 26.0–34.0)
MCHC: 32.1 g/dL (ref 30.0–36.0)
MCV: 98.6 fL (ref 80.0–100.0)
Platelets: 203 10*3/uL (ref 150–400)
RBC: 3.66 MIL/uL — ABNORMAL LOW (ref 4.22–5.81)
RDW: 12.7 % (ref 11.5–15.5)
WBC: 7.5 10*3/uL (ref 4.0–10.5)
nRBC: 0 % (ref 0.0–0.2)

## 2020-02-16 LAB — SURGICAL PCR SCREEN
MRSA, PCR: NEGATIVE
Staphylococcus aureus: NEGATIVE

## 2020-02-16 MED ORDER — MUPIROCIN 2 % EX OINT
1.0000 "application " | TOPICAL_OINTMENT | Freq: Two times a day (BID) | CUTANEOUS | Status: AC
  Administered 2020-02-16 – 2020-02-20 (×8): 1 via NASAL
  Filled 2020-02-16: qty 22

## 2020-02-16 MED ORDER — VITAMIN D (ERGOCALCIFEROL) 1.25 MG (50000 UNIT) PO CAPS
50000.0000 [IU] | ORAL_CAPSULE | ORAL | Status: DC
  Administered 2020-02-16: 50000 [IU] via ORAL
  Filled 2020-02-16: qty 1

## 2020-02-16 NOTE — TOC Initial Note (Signed)
Transition of Care [facility]) - Initial/Assessment Note    Patient Details  Name: Victor Shields MRN: [id number] Date of Birth: 1950/12/18  [facility] of [facility] CM/SW Contact:    Annice Needy, LCSW Phone Number: 02/16/2020, 3:57 PM  Clinical Narrative:                 Patient from home with wife. Admitted due to fall causing hip fracture in two places. At baseline, patient is independent and drives. Patient fell two weeks ago and started uses a walker and a cane. Wife, [name], is aware of the possibility that patient may need SNF after surgery. They are hopeful that patient will be able to return home with [facility] instead of SNF.  TOC will follow for discharge planning.   Expected Discharge Plan: Home w Home Health Services Barriers to Discharge: Continued Medical Work up   Patient Goals and CMS Choice Patient states their goals for this hospitalization and ongoing recovery are:: return home      Expected Discharge Plan and Services Expected Discharge Plan: Home w Home Health Services       Living arrangements for the past 2 months: Single Family Home                                      Prior Living Arrangements/Services Living arrangements for the past 2 months: Single Family Home Lives with:: Spouse Patient language and need for interpreter reviewed:: Yes Do you feel safe going back to the place where you live?: Yes      Need for Family Participation in Patient Care: Yes (Comment) Care giver support system in place?: Yes (comment)   Criminal Activity/Legal Involvement Pertinent to Current Situation/Hospitalization: No - Comment as needed  Activities of Daily Living Home Assistive Devices/Equipment: Walker (specify type) ADL Screening (condition at time of admission) Patient's cognitive ability adequate to safely complete daily activities?: Yes Is the patient deaf or have difficulty hearing?: No Does the patient have difficulty seeing, even when wearing  glasses/contacts?: No Does the patient have difficulty concentrating, remembering, or making decisions?: No Patient able to express need for assistance with ADLs?: Yes Does the patient have difficulty dressing or bathing?: No Independently performs ADLs?: Yes (appropriate for developmental age) Communication: Independent Does the patient have difficulty walking or climbing stairs?: No Weakness of Legs: None Weakness of Arms/Hands: None  Permission Sought/Granted Permission sought to share information with : Family Supports    Share Information with NAME: [name]     Permission granted to share info w Relationship: wife     Emotional Assessment Appearance:: Appears younger than stated age   Affect (typically observed): Appropriate Orientation: : Oriented to Self, Oriented to Place, Oriented to  Time, Oriented to Situation Alcohol / Substance Use: Not Applicable Psych Involvement: No (comment)  Admission diagnosis:  Closed left hip fracture (HCC) [S72.002A] Closed fracture of left hip, initial encounter (HCC) [S72.002A] Fall, initial encounter [W19.XXXA] Patient Active Problem List   Diagnosis Date Noted  . Closed left hip fracture (HCC) 02/15/2020  . COPD (chronic obstructive pulmonary disease) (HCC) 02/15/2020  . Hyperglycemia 02/15/2020  . Chronic bronchitis (HCC) 05/28/2019  . Tobacco abuse 05/28/2019  . Bilateral wheezing 03/20/2018  . Closed fracture of surgical neck of humerus 03/11/2018   PCP:  Junie Spencer, FNP Pharmacy:   [id number] - [place], [place] - [address]  [place] [address] Phone: [phone] Fax: [phone]  [id number] - [place], [place] - [address] [place] [address] Phone: [phone] Fax: [phone]     Social Determinants of Health (SDOH) Interventions    Readmission Risk Interventions No flowsheet data found.

## 2020-02-16 NOTE — Consult Note (Signed)
HOSPITAL CONSULT ° °[facility] ° ° °Patient ID: Victor Shields, male   DOB: 10/01/1950, 69 y.o.   MRN: [id number] ° °New patient ° °Requested by: DR CARLOS MADERNA ° °Reason for:  ° °Based on the information below I recommend ORIF LEFT HIP * ° °Chief Complaint  °Patient presents with  °• Fall  ° ° ° °HPI °69-year-old male fell June 26 presented to the ER yesterday because of ongoing pain and difficulty ambulating.  X-rays were noted and there appear to be 2 fractures both subacute 1 in the intertrochanteric Region 1 in the femoral neck.  The patient has several falls over the last 6 months unclear which fracture is most acute appears to be the inner troches fracture ° °Severity 10 °Quality SHARP ° °Modified by increased pain with ambulation and movement ° °Review of Systems (all) °Review of Systems  °Constitutional: Negative for chills, fever, malaise/fatigue and weight loss.  °HENT: Positive for hearing loss. Negative for congestion, ear discharge, ear pain, nosebleeds, sinus pain and sore throat.   °Eyes: Negative for blurred vision, double vision, photophobia, pain, discharge and redness.  °Respiratory: Positive for cough and shortness of breath. Negative for hemoptysis, sputum production and stridor.   °Cardiovascular: Negative for chest pain, palpitations, orthopnea, claudication and leg swelling.  °Gastrointestinal: Negative for heartburn and vomiting.  °Genitourinary: Negative for dysuria, frequency and urgency.  °Musculoskeletal: Positive for joint pain. Negative for myalgias.  °Skin: Negative for itching and rash.  °Neurological: Negative for dizziness, tingling, sensory change, speech change, focal weakness and weakness.  °Endo/Heme/Allergies: Negative for environmental allergies and polydipsia. Does not bruise/bleed easily.  °Psychiatric/Behavioral: Negative for depression.  ° ° °Past Medical History:  °Diagnosis Date  °• Gout   °  °Past Surgical History:  °Procedure Laterality Date  °•  CATARACT EXTRACTION    °  °History reviewed. No pertinent family history. °Social History  ° °Tobacco Use  °• Smoking status: Current Every Day Smoker  °  Packs/day: 1.00  °  Years: 40.00  °  Pack years: 40.00  °  Types: Cigarettes  °  Start date: 07/10/1975  °• Smokeless tobacco: Never Used  °Vaping Use  °• Vaping Use: Never used  °Substance Use Topics  °• Alcohol use: Yes  °  Alcohol/week: 24.0 standard drinks  °  Types: 24 Standard drinks or equivalent per week  °  Comment: "couple of beers daily"  °• Drug use: No  ° °Allergies  °Allergen Reactions  °• Penicillins Swelling  ° ° °Current Facility-Administered Medications:  °•  docusate sodium (COLACE) capsule 100 mg, 100 mg, Oral, BID, Madera, Carlos, MD, 100 mg at 02/15/20 2154 °•  heparin injection 5,000 Units, 5,000 Units, Subcutaneous, Q8H, Madera, Carlos, MD, 5,000 Units at 02/15/20 2154 °•  HYDROcodone-acetaminophen (NORCO/VICODIN) 5-325 MG per tablet 1-2 tablet, 1-2 tablet, Oral, Q6H PRN, Madera, Carlos, MD °•  ipratropium-albuterol (DUONEB) 0.5-2.5 (3) MG/3ML nebulizer solution 3 mL, 3 mL, Nebulization, Q6H PRN, Madera, Carlos, MD °•  methocarbamol (ROBAXIN) tablet 500 mg, 500 mg, Oral, Q6H PRN, 500 mg at 02/15/20 1546 **OR** methocarbamol (ROBAXIN) 500 mg in dextrose 5 % 50 mL IVPB, 500 mg, Intravenous, Q6H PRN, Madera, Carlos, MD °•  mometasone-formoterol (DULERA) 100-5 MCG/ACT inhaler 2 puff, 2 puff, Inhalation, BID, Madera, Carlos, MD, 2 puff at 02/15/20 2032 °•  morphine 2 MG/ML injection 2 mg, 2 mg, Intravenous, Q3H PRN, Madera, Carlos, MD, 2 mg at 02/15/20 2049 °•  nicotine (NICODERM CQ - dosed in mg/24 hours) patch   21 mg, 21 mg, Transdermal, Daily, Madera, Carlos, MD ° ° ° °Physical Exam(=30) °BP (!) 146/58 (BP Location: Left Arm)    Pulse 96    Temp 98.7 °F (37.1 °C) (Oral)    Resp 18    Ht 5' 8" (1.727 m)    Wt 61.5 kg    SpO2 100%    BMI 20.62 kg/m²  ° °Gen. Appearance normal body habitus no gross deformities °Peripheral vascular system color  temperature capillary refill normal °Lymph nodes ARE NORMAL  °Gait unable to ambulate secondary to pain ° °Left Upper extremity ° Inspection revealed no malalignment or asymmetry ° Assessment of range of motion: Full range of motion was recorded ° Assessment of stability: Elbow wrist and hand and shoulder were stable ° Assessment of muscle strength and tone revealed grade 5 muscle strength and normal muscle tone ° Skin was normal without rash lesion or ulceration ° °Right upper extremity ° Inspection revealed no malalignment or asymmetry ° Assessment of range of motion: Full range of motion was recorded ° Assessment of stability: Elbow wrist and hand and shoulder were stable ° Assessment of muscle strength and tone revealed grade 5 muscle strength and normal muscle tone ° Skin was normal without rash lesion or ulceration ° °Right Lower extremity ° Inspection revealed no malalignment or asymmetry ° Assessment of range of motion: Full range of motion was recorded ° Assessment of stability: Ankle, knee and hip were stable ° Assessment of muscle strength and tone revealed grade 5 muscle strength and normal muscle tone ° Skin was normal without rash lesion or ulceration ° °Left lower extremity °Skin looks clean °Range of motion deferred because of pain °Ankle knee stable °Muscle tone normal °Pain and tenderness proximal femur ° ° °Coordination was tested by finger-to-nose nose and was normal °Deep tendon reflexes were 2+ in the upper extremities lower extremities deferred  °examination of sensation by touch was normal ° °Mental status ° Oriented to time person and place normal ° Mood and affect normal without depression anxiety or agitation ° °Dx:  ° °Data Reviewed ° °ER RECORD REVIEWED: CONFIRMS HISTORY  ° °I reviewed the following images and the reports and my independent interpretation is appears to have a femoral neck fracture unclear when that occurred looks to be subacute as does the inner troches fracture which  looks to be more acute ° °Assessment ° °Left hip pelvis and x-ray AP lateral reveal 2 fractures 1 in the femoral neck 1 in the intertrochanteric region region unclear in terms of timing but the intertrochanteric fracture appears to be more acute ° °Plan ° ° °Recommend open treatment internal fixation left hip with dynamic hip screw and superior derotation screw to possibly stabilize both fractures.  If on fracture table evaluation the femoral neck fracture proves to be acute then hopefully closed reduction can be performed if not open reduction will be needed. ° ° °Sampson Self E Madlynn Lundeen MD  ° ° °

## 2020-02-16 NOTE — H&P (View-Only) (Signed)
HOSPITAL CONSULT  [facility]   Patient ID: Victor Shields, male   DOB: 1950-09-06, 69 y.o.   MRN: [id number]  New patient  Requested by: DR Dahlia Client  Reason for:   Based on the information below I recommend ORIF LEFT HIP *  Chief Complaint  Patient presents with   Fall     HPI 69 year old male fell June 26 presented to the ER yesterday because of ongoing pain and difficulty ambulating.  X-rays were noted and there appear to be 2 fractures both subacute 1 in the intertrochanteric Region 1 in the femoral neck.  The patient has several falls over the last 6 months unclear which fracture is most acute appears to be the inner troches fracture  Severity 10 Quality SHARP  Modified by increased pain with ambulation and movement  Review of Systems (all) Review of Systems  Constitutional: Negative for chills, fever, malaise/fatigue and weight loss.  HENT: Positive for hearing loss. Negative for congestion, ear discharge, ear pain, nosebleeds, sinus pain and sore throat.   Eyes: Negative for blurred vision, double vision, photophobia, pain, discharge and redness.  Respiratory: Positive for cough and shortness of breath. Negative for hemoptysis, sputum production and stridor.   Cardiovascular: Negative for chest pain, palpitations, orthopnea, claudication and leg swelling.  Gastrointestinal: Negative for heartburn and vomiting.  Genitourinary: Negative for dysuria, frequency and urgency.  Musculoskeletal: Positive for joint pain. Negative for myalgias.  Skin: Negative for itching and rash.  Neurological: Negative for dizziness, tingling, sensory change, speech change, focal weakness and weakness.  Endo/Heme/Allergies: Negative for environmental allergies and polydipsia. Does not bruise/bleed easily.  Psychiatric/Behavioral: Negative for depression.    Past Medical History:  Diagnosis Date   Gout     Past Surgical History:  Procedure Laterality Date    CATARACT EXTRACTION      History reviewed. No pertinent family history. Social History   Tobacco Use   Smoking status: Current Every Day Smoker    Packs/day: 1.00    Years: 40.00    Pack years: 40.00    Types: Cigarettes    Start date: 07/10/1975   Smokeless tobacco: Never Used  Vaping Use   Vaping Use: Never used  Substance Use Topics   Alcohol use: Yes    Alcohol/week: 24.0 standard drinks    Types: 24 Standard drinks or equivalent per week    Comment: "couple of beers daily"   Drug use: No   Allergies  Allergen Reactions   Penicillins Swelling    Current Facility-Administered Medications:    docusate sodium (COLACE) capsule 100 mg, 100 mg, Oral, BID, Vassie Loll, MD, 100 mg at 02/15/20 2154   heparin injection 5,000 Units, 5,000 Units, Subcutaneous, Q8H, Vassie Loll, MD, 5,000 Units at 02/15/20 2154   HYDROcodone-acetaminophen (NORCO/VICODIN) 5-325 MG per tablet 1-2 tablet, 1-2 tablet, Oral, Q6H PRN, Vassie Loll, MD   ipratropium-albuterol (DUONEB) 0.5-2.5 (3) MG/3ML nebulizer solution 3 mL, 3 mL, Nebulization, Q6H PRN, Vassie Loll, MD   methocarbamol (ROBAXIN) tablet 500 mg, 500 mg, Oral, Q6H PRN, 500 mg at 02/15/20 1546 **OR** methocarbamol (ROBAXIN) 500 mg in dextrose 5 % 50 mL IVPB, 500 mg, Intravenous, Q6H PRN, Vassie Loll, MD   mometasone-formoterol [facility]) 100-5 MCG/ACT inhaler 2 puff, 2 puff, Inhalation, BID, Vassie Loll, MD, 2 puff at 02/15/20 2032   morphine 2 MG/ML injection 2 mg, 2 mg, Intravenous, Q3H PRN, Vassie Loll, MD, 2 mg at 02/15/20 2049   nicotine (NICODERM CQ - dosed in mg/24 hours) patch  21 mg, 21 mg, Transdermal, Daily, Vassie Loll, MD    Physical Exam(=30) BP (!) 146/58 (BP Location: Left Arm)    Pulse 96    Temp 98.7 F (37.1 C) (Oral)    Resp 18    Ht 5\' 8"  (1.727 m)    Wt 61.5 kg    SpO2 100%    BMI 20.62 kg/m   Gen. Appearance normal body habitus no gross deformities Peripheral vascular system color  temperature capillary refill normal Lymph nodes ARE NORMAL  Gait unable to ambulate secondary to pain  Left Upper extremity  Inspection revealed no malalignment or asymmetry  Assessment of range of motion: Full range of motion was recorded  Assessment of stability: Elbow wrist and hand and shoulder were stable  Assessment of muscle strength and tone revealed grade 5 muscle strength and normal muscle tone  Skin was normal without rash lesion or ulceration  Right upper extremity  Inspection revealed no malalignment or asymmetry  Assessment of range of motion: Full range of motion was recorded  Assessment of stability: Elbow wrist and hand and shoulder were stable  Assessment of muscle strength and tone revealed grade 5 muscle strength and normal muscle tone  Skin was normal without rash lesion or ulceration  Right Lower extremity  Inspection revealed no malalignment or asymmetry  Assessment of range of motion: Full range of motion was recorded  Assessment of stability: Ankle, knee and hip were stable  Assessment of muscle strength and tone revealed grade 5 muscle strength and normal muscle tone  Skin was normal without rash lesion or ulceration  Left lower extremity Skin looks clean Range of motion deferred because of pain Ankle knee stable Muscle tone normal Pain and tenderness proximal femur   Coordination was tested by finger-to-nose nose and was normal Deep tendon reflexes were 2+ in the upper extremities lower extremities deferred  examination of sensation by touch was normal  Mental status  Oriented to time person and place normal  Mood and affect normal without depression anxiety or agitation  Dx:   Data Reviewed  ER RECORD REVIEWED: CONFIRMS HISTORY   I reviewed the following images and the reports and my independent interpretation is appears to have a femoral neck fracture unclear when that occurred looks to be subacute as does the inner troches fracture which  looks to be more acute  Assessment  Left hip pelvis and x-ray AP lateral reveal 2 fractures 1 in the femoral neck 1 in the intertrochanteric region region unclear in terms of timing but the intertrochanteric fracture appears to be more acute  Plan   Recommend open treatment internal fixation left hip with dynamic hip screw and superior derotation screw to possibly stabilize both fractures.  If on fracture table evaluation the femoral neck fracture proves to be acute then hopefully closed reduction can be performed if not open reduction will be needed.   MD

## 2020-02-16 NOTE — Progress Notes (Signed)
PROGRESS NOTE    [name]  HUD:[id number] DOB: 06/27/1951 DOA: 02/15/2020 PCP: Victor Spencer, FNP   Chief Complaint  Patient presents with   Fall    Brief Narrative:  Victor Shields is a 69 y.o. male with medical history significant of COPD, tobacco abuse (ongoing) and gout arthritis; who presented to the hospital after experiencing fall at home and been unable to stand up and bear weight on his left hip. Patient denies LOC, but expressed feeling mildly lightheaded prior to falling. He normally uses a rolling walker for ambulation and expressed having previous fall approx 1 week prior to this one after tripping while walking. Patient denies fever, chills, SOB, nausea, vomiting, melena, dysuria, hematuria, hematochezia and any other complaints.  His pain was 8/10 in intensity localized in his left hip, no radiated and worsen with any movement or attempt to bear weight.   ED Course: COVID negative, no acute ischemic changes on EKG, overall stable blood work for him and with hip images demonstrating closed intertrochanteric left hip fracture. Orthopedic surgery consulted and planning surgical repair. [facility] called to admit patient for further evaluation and management.  Assessment & Plan: 1-closed left hip fracture -Continue as needed analgesics -Low vitamin D level appreciated (19.72); will start repletion. -Orthopedic surgery service on board and planning for surgical repair on 02/17/20 -After surgical repair PT/OT evaluation to determine discharge pathways.  2-tobacco abuse -Cessation counseling provided -Nicotine patch has been offered; patient said that he will use it as needed only.  3-COPD -No requiring oxygen supplementation and currently no wheezing -Continue home maintenance/rescue bronchodilator regimen.  4-GERD -will use PPI  5-hyperglycemia -In the setting of stress demargination -A1c 4.5 -Follow CBGs with bloodwork and treat with sliding scale  insulin as needed if > 200.  6-history of gout -No acute flare appreciated -Continue as needed analgesics.  DVT prophylaxis: Heparin Code Status: Full code Family Communication: Wife at bedside. Disposition:   Status is: Inpatient  Dispo: The patient is from: Home              Anticipated d/c is to: to be determined               Anticipated d/c date is: 2-3 days (pending surgery tolerance and discharge needs, as he might need SNF for rehab)              Patient currently no medically stable for discharge; patient will require surgical repair of his left hip fracture anticipated for 02/17/2020.  Physical therapy/Occupational Therapy to evaluate and provide recommendations after surgical intervention.  Continue as needed analgesics and supportive care.       Consultants:  Orthopedic service (Dr. Sharyl Nimrod).  Procedures: See below for x-ray reports Anticipated ORIF left hip 02/17/2020   Antimicrobials:  None   Subjective: Still with pain on his left lower extremity when trying to move or repositioned.  Denies chest pain, no nausea, no vomiting, no shortness of breath.  In no acute distress while resting and receiving pain medications intermittently.  Objective: Vitals:   02/16/20 0246 02/16/20 0550 02/16/20 0835 02/16/20 1450  BP: (!) 145/51 (!) 146/58  (!) 130/55  Pulse: 97 96  96  Resp: 19 18    Temp:  98.7 F (37.1 C)  99.2 F (37.3 C)  TempSrc:  Oral  Oral  SpO2: 99% 100% 98% 97%  Weight:      Height:        Intake/Output Summary (Last 24 hours) at  02/16/2020 1534 Last data filed at 02/16/2020 0550 Gross per 24 hour  Intake 665.07 ml  Output 200 ml  Net 465.07 ml   Filed Weights   02/15/20 0907 02/15/20 1857  Weight: 74.8 kg 61.5 kg    Examination:  General exam: Appears calm and comfortable after receiving pain medication and now attempting to move.  Patient is hard of hearing; no chest pain, no nausea, no vomiting.  No fever and in no acute  distress. Respiratory system: Scattered rhonchi bilaterally; no wheezing, no crackles.  No using accessory muscles.  Good O2 sat on room air. Cardiovascular system: S1 & S2 heard, RRR. No JVD, murmurs, rubs, gallops or clicks. No pedal edema. Gastrointestinal system: Abdomen is nondistended, soft and nontender. No organomegaly or masses felt. Normal bowel sounds heard. Central nervous system: Alert and oriented. No focal neurological deficits. Extremities: No cyanosis or clubbing. Skin: No rashes, no petechiae. Psychiatry: Judgement and insight appear normal. Mood & affect appropriate.     Data Reviewed: I have personally reviewed following labs and imaging studies  CBC: Recent Labs  Lab 02/15/20 1024 02/16/20 0420  WBC 6.2 7.5  NEUTROABS 4.4  --   HGB 12.8* 11.6*  HCT 38.8* 36.1*  MCV 97.5 98.6  PLT 185 203    Basic Metabolic Panel: Recent Labs  Lab 02/15/20 1024  NA 138  K 4.3  CL 104  CO2 26  GLUCOSE 148*  BUN 10  CREATININE 0.57*  CALCIUM 8.4*    GFR: Estimated Creatinine Clearance: 74.7 mL/min (A) (by C-G formula based on SCr of 0.57 mg/dL (L)).  CBG: Recent Labs  Lab 02/16/20 0345  GLUCAP 122*     Recent Results (from the past 240 hour(s))  SARS Coronavirus 2 by RT PCR (hospital order, performed in [facility] hospital lab) Nasopharyngeal Nasopharyngeal Swab     Status: None   Collection Time: 02/15/20  1:16 PM   Specimen: Nasopharyngeal Swab  Result Value Ref Range Status   SARS Coronavirus 2 NEGATIVE NEGATIVE Final    Comment: (NOTE) SARS-CoV-2 target nucleic acids are NOT DETECTED.  The SARS-CoV-2 RNA is generally detectable in upper and lower respiratory specimens during the acute phase of infection. The lowest concentration of SARS-CoV-2 viral copies this assay can detect is 250 copies / mL. A negative result does not preclude SARS-CoV-2 infection and should not be used as the sole basis for treatment or other patient management decisions.   A negative result may occur with improper specimen collection / handling, submission of specimen other than nasopharyngeal swab, presence of viral mutation(s) within the areas targeted by this assay, and inadequate number of viral copies (<250 copies / mL). A negative result must be combined with clinical observations, patient history, and epidemiological information.  Fact Sheet for Patients:   BoilerBrush.com.cy  Fact Sheet for Healthcare Providers: https://pope.com/  This test is not yet approved or  cleared by the [place] FDA and has been authorized for detection and/or diagnosis of SARS-CoV-2 by FDA under an Emergency Use Authorization (EUA).  This EUA will remain in effect (meaning this test can be used) for the duration of the COVID-19 declaration under Section 564(b)(1) of the Act, 21 U.S.C. section 360bbb-3(b)(1), unless the authorization is terminated or revoked sooner.  Performed at [facility], [address] [place], [place] [address]      Radiology Studies: DG Chest Portable 1 View  Result Date: 02/15/2020 CLINICAL DATA:  Preop hip fracture. EXAM: PORTABLE CHEST 1 VIEW COMPARISON:  None. FINDINGS: Trachea is midline. Heart size normal. Thoracic aorta is calcified. Lungs are hyperinflated. Small right pleural effusion or pleural thickening. Lungs are otherwise grossly clear. IMPRESSION: 1. Small right pleural effusion or pleural thickening. 2. Hyperinflation. 3.  Aortic atherosclerosis (ICD10-I70.0). Electronically Signed   By: Leanna Battles M.D.   On: 02/15/2020 10:41   DG Hip Unilat W or Wo Pelvis 2-3 Views Left  Result Date: 02/15/2020 CLINICAL DATA:  Larey Seat 3 weeks ago and again today. Left hip pain. EXAM: DG HIP (WITH OR WITHOUT PELVIS) 2-3V LEFT COMPARISON:  None. FINDINGS: There is an impacted and possibly subacute intertrochanteric fracture of the left hip. The right hip is intact. The pubic symphysis and SI  joints are intact. No pelvic fractures. Extensive vascular calcifications. IMPRESSION: Impacted acute or subacute intertrochanteric fracture of the left hip. Electronically Signed   By: Rudie Meyer M.D.   On: 02/15/2020 10:06    Scheduled Meds:  docusate sodium  100 mg Oral BID   heparin  5,000 Units Subcutaneous Q8H   mometasone-formoterol  2 puff Inhalation BID   mupirocin ointment  1 application Nasal BID   nicotine  21 mg Transdermal Daily   Continuous Infusions:  methocarbamol (ROBAXIN) IV       LOS: 1 day    Time spent: 30 minutes.    Vassie Loll, MD [facility]   To contact the attending provider between 7A-7P or the covering provider during after hours 7P-7A, please log into the web site [email or web address] and access using universal [place] password for that web site. If you do not have the password, please call the hospital operator.  02/16/2020, 3:34 PM

## 2020-02-17 ENCOUNTER — Encounter (HOSPITAL_COMMUNITY)
Admission: EM | Disposition: A | Discharge status: Skilled Nursing Facility | Payer: Self-pay | Source: Home / Self Care | Attending: Internal Medicine

## 2020-02-17 ENCOUNTER — Inpatient Hospital Stay (HOSPITAL_COMMUNITY): Payer: Medicare HMO

## 2020-02-17 ENCOUNTER — Encounter (HOSPITAL_COMMUNITY): Payer: Self-pay | Admitting: Internal Medicine

## 2020-02-17 ENCOUNTER — Inpatient Hospital Stay (HOSPITAL_COMMUNITY): Payer: Medicare HMO | Admitting: Anesthesiology

## 2020-02-17 DIAGNOSIS — S72002A Fracture of unspecified part of neck of left femur, initial encounter for closed fracture: Secondary | ICD-10-CM | POA: Diagnosis not present

## 2020-02-17 DIAGNOSIS — J438 Other emphysema: Secondary | ICD-10-CM | POA: Diagnosis not present

## 2020-02-17 DIAGNOSIS — R739 Hyperglycemia, unspecified: Secondary | ICD-10-CM | POA: Diagnosis not present

## 2020-02-17 DIAGNOSIS — S72002G Fracture of unspecified part of neck of left femur, subsequent encounter for closed fracture with delayed healing: Secondary | ICD-10-CM | POA: Diagnosis not present

## 2020-02-17 DIAGNOSIS — W19XXXA Unspecified fall, initial encounter: Secondary | ICD-10-CM

## 2020-02-17 HISTORY — PX: COMPRESSION HIP SCREW: SHX1386

## 2020-02-17 SURGERY — COMPRESSION HIP
Anesthesia: Spinal | Site: Hip | Laterality: Left

## 2020-02-17 MED ORDER — PHENYLEPHRINE 40 MCG/ML (10ML) SYRINGE FOR IV PUSH (FOR BLOOD PRESSURE SUPPORT)
PREFILLED_SYRINGE | INTRAVENOUS | Status: DC | PRN
  Administered 2020-02-17: 100 ug via INTRAVENOUS
  Administered 2020-02-17: 120 ug via INTRAVENOUS

## 2020-02-17 MED ORDER — PROPOFOL 500 MG/50ML IV EMUL
INTRAVENOUS | Status: DC | PRN
  Administered 2020-02-17: 50 ug/kg/min via INTRAVENOUS

## 2020-02-17 MED ORDER — VANCOMYCIN HCL IN DEXTROSE 1-5 GM/200ML-% IV SOLN
1000.0000 mg | INTRAVENOUS | Status: AC
  Administered 2020-02-17: 1000 mg via INTRAVENOUS
  Filled 2020-02-17: qty 200

## 2020-02-17 MED ORDER — BUPIVACAINE IN DEXTROSE 0.75-8.25 % IT SOLN
INTRATHECAL | Status: DC | PRN
  Administered 2020-02-17: 1.6 mL via INTRATHECAL

## 2020-02-17 MED ORDER — FENTANYL CITRATE (PF) 100 MCG/2ML IJ SOLN: 25.0000 ug | INTRAMUSCULAR | Status: DC | PRN

## 2020-02-17 MED ORDER — VANCOMYCIN HCL IN DEXTROSE 1-5 GM/200ML-% IV SOLN
1000.0000 mg | Freq: Two times a day (BID) | INTRAVENOUS | Status: AC
  Administered 2020-02-17: 1000 mg via INTRAVENOUS
  Filled 2020-02-17: qty 200

## 2020-02-17 MED ORDER — ENSURE ENLIVE PO LIQD
237.0000 mL | Freq: Two times a day (BID) | ORAL | Status: DC
  Administered 2020-02-17 – 2020-02-22 (×7): 237 mL via ORAL

## 2020-02-17 MED ORDER — PROPOFOL 10 MG/ML IV BOLUS
INTRAVENOUS | Status: DC | PRN
  Administered 2020-02-17: 10 mg via INTRAVENOUS
  Administered 2020-02-17 (×2): 40 mg via INTRAVENOUS
  Administered 2020-02-17: 50 ug/kg/min via INTRAVENOUS

## 2020-02-17 MED ORDER — POVIDONE-IODINE 10 % EX SWAB: 2.0000 "application " | Freq: Once | CUTANEOUS | Status: DC

## 2020-02-17 MED ORDER — JUVEN PO PACK
1.0000 | PACK | Freq: Two times a day (BID) | ORAL | Status: DC
  Administered 2020-02-17 – 2020-02-20 (×3): 1 via ORAL
  Filled 2020-02-17 (×7): qty 1

## 2020-02-17 MED ORDER — FENTANYL CITRATE (PF) 100 MCG/2ML IJ SOLN
INTRAMUSCULAR | Status: AC
  Filled 2020-02-17: qty 2

## 2020-02-17 MED ORDER — GABAPENTIN 300 MG PO CAPS
300.0000 mg | ORAL_CAPSULE | Freq: Three times a day (TID) | ORAL | Status: DC
  Administered 2020-02-17 – 2020-02-22 (×15): 300 mg via ORAL
  Filled 2020-02-17 (×15): qty 1

## 2020-02-17 MED ORDER — ONDANSETRON HCL 4 MG PO TABS: 4.0000 mg | ORAL_TABLET | Freq: Four times a day (QID) | ORAL | Status: DC | PRN

## 2020-02-17 MED ORDER — 0.9 % SODIUM CHLORIDE (POUR BTL) OPTIME
TOPICAL | Status: DC | PRN
  Administered 2020-02-17: 1000 mL

## 2020-02-17 MED ORDER — ONDANSETRON HCL 4 MG/2ML IJ SOLN: 4.0000 mg | Freq: Once | INTRAMUSCULAR | Status: DC | PRN

## 2020-02-17 MED ORDER — CHLORHEXIDINE GLUCONATE CLOTH 2 % EX PADS
6.0000 | MEDICATED_PAD | Freq: Every day | CUTANEOUS | Status: DC
  Administered 2020-02-17 – 2020-02-21 (×4): 6 via TOPICAL

## 2020-02-17 MED ORDER — CHLORHEXIDINE GLUCONATE 4 % EX LIQD
60.0000 mL | Freq: Once | CUTANEOUS | Status: AC
  Administered 2020-02-17: 4 via TOPICAL

## 2020-02-17 MED ORDER — CHLORHEXIDINE GLUCONATE 0.12 % MT SOLN
15.0000 mL | Freq: Once | OROMUCOSAL | Status: AC
  Administered 2020-02-17: 15 mL via OROMUCOSAL

## 2020-02-17 MED ORDER — DOCUSATE SODIUM 100 MG PO CAPS
100.0000 mg | ORAL_CAPSULE | Freq: Two times a day (BID) | ORAL | Status: DC
  Administered 2020-02-17 – 2020-02-22 (×9): 100 mg via ORAL
  Filled 2020-02-17 (×7): qty 1

## 2020-02-17 MED ORDER — METOCLOPRAMIDE HCL 10 MG PO TABS: 5.0000 mg | ORAL_TABLET | Freq: Three times a day (TID) | ORAL | Status: DC | PRN

## 2020-02-17 MED ORDER — MENTHOL 3 MG MT LOZG: 1.0000 | LOZENGE | OROMUCOSAL | Status: DC | PRN

## 2020-02-17 MED ORDER — TRAMADOL HCL 50 MG PO TABS
50.0000 mg | ORAL_TABLET | Freq: Four times a day (QID) | ORAL | Status: DC
  Administered 2020-02-17 – 2020-02-22 (×16): 50 mg via ORAL
  Filled 2020-02-17 (×16): qty 1

## 2020-02-17 MED ORDER — BUPIVACAINE-EPINEPHRINE (PF) 0.5% -1:200000 IJ SOLN
INTRAMUSCULAR | Status: AC
  Filled 2020-02-17: qty 30

## 2020-02-17 MED ORDER — SODIUM CHLORIDE 0.9 % IV SOLN: INTRAVENOUS | Status: DC

## 2020-02-17 MED ORDER — LACTATED RINGERS IV SOLN
INTRAVENOUS | Status: DC
  Administered 2020-02-17: 1000 mL via INTRAVENOUS

## 2020-02-17 MED ORDER — CELECOXIB 100 MG PO CAPS
200.0000 mg | ORAL_CAPSULE | Freq: Two times a day (BID) | ORAL | Status: DC
  Administered 2020-02-17 – 2020-02-22 (×10): 200 mg via ORAL
  Filled 2020-02-17 (×10): qty 2

## 2020-02-17 MED ORDER — ASPIRIN EC 325 MG PO TBEC
325.0000 mg | DELAYED_RELEASE_TABLET | Freq: Every day | ORAL | Status: DC
  Administered 2020-02-18 – 2020-02-22 (×5): 325 mg via ORAL
  Filled 2020-02-17 (×5): qty 1

## 2020-02-17 MED ORDER — ADULT MULTIVITAMIN W/MINERALS CH
1.0000 | ORAL_TABLET | Freq: Every day | ORAL | Status: DC
  Administered 2020-02-18 – 2020-02-22 (×5): 1 via ORAL
  Filled 2020-02-17 (×7): qty 1

## 2020-02-17 MED ORDER — BUPIVACAINE-EPINEPHRINE (PF) 0.5% -1:200000 IJ SOLN
INTRAMUSCULAR | Status: DC | PRN
  Administered 2020-02-17: 30 mL via PERINEURAL

## 2020-02-17 MED ORDER — ACETAMINOPHEN 500 MG PO TABS
500.0000 mg | ORAL_TABLET | Freq: Four times a day (QID) | ORAL | Status: AC
  Administered 2020-02-17 – 2020-02-18 (×4): 500 mg via ORAL
  Filled 2020-02-17 (×4): qty 1

## 2020-02-17 MED ORDER — STERILE WATER FOR IRRIGATION IR SOLN
Status: DC | PRN
  Administered 2020-02-17: 1000 mL

## 2020-02-17 MED ORDER — FENTANYL CITRATE (PF) 100 MCG/2ML IJ SOLN
INTRAMUSCULAR | Status: DC | PRN
  Administered 2020-02-17 (×2): 50 ug via INTRAVENOUS

## 2020-02-17 MED ORDER — ONDANSETRON HCL 4 MG/2ML IJ SOLN: 4.0000 mg | Freq: Four times a day (QID) | INTRAMUSCULAR | Status: DC | PRN

## 2020-02-17 MED ORDER — PHENOL 1.4 % MT LIQD: 1.0000 | OROMUCOSAL | Status: DC | PRN

## 2020-02-17 MED ORDER — ORAL CARE MOUTH RINSE: 15.0000 mL | Freq: Once | OROMUCOSAL | Status: AC

## 2020-02-17 MED ORDER — METOCLOPRAMIDE HCL 5 MG/ML IJ SOLN
5.0000 mg | Freq: Three times a day (TID) | INTRAMUSCULAR | Status: DC | PRN
  Administered 2020-02-18: 10 mg via INTRAVENOUS
  Filled 2020-02-17: qty 2

## 2020-02-17 MED ORDER — POLYETHYLENE GLYCOL 3350 17 G PO PACK: 17.0000 g | PACK | Freq: Every day | ORAL | Status: DC | PRN

## 2020-02-17 MED ORDER — PROPOFOL 10 MG/ML IV BOLUS
INTRAVENOUS | Status: AC
  Filled 2020-02-17: qty 20

## 2020-02-17 SURGICAL SUPPLY — 63 items
BIT DRILL CANN LRG QC 5X300 (BIT) ×2 IMPLANT
BIT DRILL TWIST 3.5MM (BIT) ×1 IMPLANT
BLADE SURG SZ10 CARB STEEL (BLADE) ×6 IMPLANT
BNDG GAUZE ELAST 4 BULKY (GAUZE/BANDAGES/DRESSINGS) ×3 IMPLANT
CHLORAPREP W/TINT 26 (MISCELLANEOUS) ×3 IMPLANT
CLOTH BEACON ORANGE TIMEOUT ST (SAFETY) ×3 IMPLANT
COVER LIGHT HANDLE STERIS (MISCELLANEOUS) ×6 IMPLANT
COVER MAYO STAND XLG (MISCELLANEOUS) ×3 IMPLANT
COVER WAND RF STERILE (DRAPES) ×3 IMPLANT
DECANTER SPIKE VIAL GLASS SM (MISCELLANEOUS) ×3 IMPLANT
DRAPE STERI IOBAN 125X83 (DRAPES) ×3 IMPLANT
DRILL TWIST 3.5MM (BIT) ×3
DRSG MEPILEX BORDER 4X12 (GAUZE/BANDAGES/DRESSINGS) ×3 IMPLANT
DRSG MEPILEX SACRM 8.7X9.8 (GAUZE/BANDAGES/DRESSINGS) ×2 IMPLANT
ELECT REM PT RETURN 9FT ADLT (ELECTROSURGICAL) ×3
ELECTRODE REM PT RTRN 9FT ADLT (ELECTROSURGICAL) ×1 IMPLANT
GLOVE BIOGEL M 7.0 STRL (GLOVE) ×2 IMPLANT
GLOVE BIOGEL PI IND STRL 7.0 (GLOVE) ×2 IMPLANT
GLOVE BIOGEL PI INDICATOR 7.0 (GLOVE) ×6
GLOVE ECLIPSE 7.0 STRL STRAW (GLOVE) ×6 IMPLANT
GLOVE SKINSENSE NS SZ8.0 LF (GLOVE) ×2
GLOVE SKINSENSE STRL SZ8.0 LF (GLOVE) ×1 IMPLANT
GLOVE SS N UNI LF 8.5 STRL (GLOVE) ×3 IMPLANT
GOWN STRL REUS W/TWL LRG LVL3 (GOWN DISPOSABLE) ×7 IMPLANT
GOWN STRL REUS W/TWL XL LVL3 (GOWN DISPOSABLE) ×3 IMPLANT
GUIDE PIN 3.2X230MM (Pin) ×9 IMPLANT
GUIDEWIRE THREADED 2.8 (WIRE) ×2 IMPLANT
INST SET MAJOR BONE (KITS) ×3 IMPLANT
KIT BLADEGUARD II DBL (SET/KITS/TRAYS/PACK) ×3 IMPLANT
KIT TURNOVER CYSTO (KITS) ×3 IMPLANT
MANIFOLD NEPTUNE II (INSTRUMENTS) ×3 IMPLANT
MARKER SKIN DUAL TIP RULER LAB (MISCELLANEOUS) ×3 IMPLANT
NDL HYPO 21X1.5 SAFETY (NEEDLE) ×1 IMPLANT
NDL SPNL 18GX3.5 QUINCKE PK (NEEDLE) ×1 IMPLANT
NEEDLE HYPO 21X1.5 SAFETY (NEEDLE) ×3 IMPLANT
NEEDLE SPNL 18GX3.5 QUINCKE PK (NEEDLE) ×3 IMPLANT
NS IRRIG 1000ML POUR BTL (IV SOLUTION) ×3 IMPLANT
PACK BASIC III (CUSTOM PROCEDURE TRAY) ×3
PACK SRG BSC III STRL LF ECLPS (CUSTOM PROCEDURE TRAY) ×1 IMPLANT
PAD ABD 5X9 TENDERSORB (GAUZE/BANDAGES/DRESSINGS) ×3 IMPLANT
PAD ARMBOARD 7.5X6 YLW CONV (MISCELLANEOUS) ×3 IMPLANT
PENCIL SMOKE EVACUATOR COATED (MISCELLANEOUS) ×3 IMPLANT
PIN GUIDE 3.2X230MM (Pin) ×1 IMPLANT
PLATE SHORT BARREL 135X4 (Plate) ×2 IMPLANT
SCREW CANN THREADED 7.3X85 (Screw) ×2 IMPLANT
SCREW COMPRESSION 19.0M (Screw) ×2 IMPLANT
SCREW CORTICAL SFTP 4.5X36MM (Screw) ×2 IMPLANT
SCREW CORTICAL SFTP 4.5X38MM (Screw) ×2 IMPLANT
SCREW CORTICAL SFTP 4.5X40MM (Screw) ×2 IMPLANT
SCREW CORTICAL SFTP 4.5X42MM (Screw) ×2 IMPLANT
SCREW LAG 105MM (Screw) ×2 IMPLANT
SET BASIN LINEN APH (SET/KITS/TRAYS/PACK) ×3 IMPLANT
SPONGE LAP 18X18 RF (DISPOSABLE) ×6 IMPLANT
STAPLER VISISTAT 35W (STAPLE) ×3 IMPLANT
SUT BRALON NAB BRD #1 30IN (SUTURE) ×4 IMPLANT
SUT MNCRL 0 VIOLET CTX 36 (SUTURE) ×1 IMPLANT
SUT MONOCRYL 0 CTX 36 (SUTURE) ×3
SYR 20ML LL LF (SYRINGE) ×9 IMPLANT
SYR 30ML LL (SYRINGE) ×3 IMPLANT
SYR BULB IRRIG 60ML STRL (SYRINGE) ×3 IMPLANT
TRAY FOLEY MTR SLVR 16FR STAT (SET/KITS/TRAYS/PACK) ×3 IMPLANT
WASHER FOR 5.0 SCREWS (Washer) ×2 IMPLANT
YANKAUER SUCT BULB TIP 10FT TU (MISCELLANEOUS) ×3 IMPLANT

## 2020-02-17 NOTE — Transfer of Care (Signed)
Immediate Anesthesia Transfer of Care Note  Patient: Victor Shields  Procedure(s) Performed: OPEN TREATMENT INTERNAL FIXATION LEFT HIP (Left Hip)  Patient Location: PACU  Anesthesia Type:Spinal  Level of Consciousness: awake, alert , oriented and patient cooperative  Airway & Oxygen Therapy: Patient Spontanous Breathing  Post-op Assessment: Report given to RN, Post -op Vital signs reviewed and stable and Patient moving all extremities  Post vital signs: Reviewed and stable  Last Vitals:  Vitals Value Taken Time  BP 127/80 02/17/20 1554  Temp    Pulse 87 02/17/20 1558  Resp 10 02/17/20 1558  SpO2 100 % 02/17/20 1558  Vitals shown include unvalidated device data.  Last Pain:  Vitals:   02/17/20 1149  TempSrc: Oral  PainSc: 0-No pain      Patients Stated Pain Goal: 8 (02/17/20 1149)  Complications: No complications documented.

## 2020-02-17 NOTE — Progress Notes (Signed)
From PACU, Alert and oriented x 3.  Dressing to left hip dry and intact with ice in place.  Ted hose placed and scd's reapplied and legs elevated on pillow.   Vitals stable.  Able to swallow pills and drank some but did not want food. Left leg and foot warm with full sensation and pulse.  Wife at bedside.

## 2020-02-17 NOTE — Anesthesia Postprocedure Evaluation (Signed)
Anesthesia Post Note  Patient: Hagop Mccollam  Procedure(s) Performed: OPEN TREATMENT INTERNAL FIXATION LEFT HIP (Left Hip)  Patient location during evaluation: PACU Anesthesia Type: Spinal Level of consciousness: awake, oriented, awake and alert and patient cooperative Pain management: pain level controlled Vital Signs Assessment: post-procedure vital signs reviewed and stable Respiratory status: spontaneous breathing, respiratory function stable and nonlabored ventilation Cardiovascular status: blood pressure returned to baseline and stable Postop Assessment: no headache and no backache Anesthetic complications: no   No complications documented.   Last Vitals:  Vitals:   02/17/20 1200 02/17/20 1215  BP: (!) 121/54 (!) 137/56  Pulse: 83 77  Resp: 12 19  Temp:    SpO2: 94% 94%    Last Pain:  Vitals:   02/17/20 1149  TempSrc: Oral  PainSc: 0-No pain                 Brynda Peon

## 2020-02-17 NOTE — Interval H&P Note (Signed)
History and Physical Interval Note:  02/17/2020 1:40 PM  Victor Shields  has presented today for surgery, with the diagnosis of LEFT HIP FRACTURES.  The various methods of treatment have been discussed with the patient and family. After consideration of risks, benefits and other options for treatment, the patient has consented to  Procedure(s): OPEN TREATMENT INTERNAL FIXATION LEFT HIP (Left) as a surgical intervention.  The patient's history has been reviewed, patient examined, no change in status, stable for surgery.  I have reviewed the patient's chart and labs.  Questions were answered to the patient's satisfaction.    CBC Latest Ref Rng & Units 02/16/2020 02/15/2020 11/15/2017  WBC 4.0 - 10.5 K/uL 7.5 6.2 8.1  Hemoglobin 13.0 - 17.0 g/dL 11.6(L) 12.8(L) 15.6  Hematocrit 39 - 52 % 36.1(L) 38.8(L) 44.8  Platelets 150 - 400 K/uL 203 185 212    [address]

## 2020-02-17 NOTE — Progress Notes (Signed)
Initial Nutrition Assessment  DOCUMENTATION CODES:      INTERVENTION:  -Ensure Enlive po BID, each supplement provides 350 kcal and 20 grams of protein   -1 packet Juven BID, each packet provides 95 calories, 2.5 grams of protein (collagen), and 9.8 grams of carbohydrate (3 grams sugar); to support wound healing   -MVI daily  NUTRITION DIAGNOSIS:   Increased nutrient needs related to  (left hip fracture, s/p surgical repair) as evidenced by estimated needs.  GOAL:  Patient will meet greater than or equal to 90% of their needs   MONITOR:  PO intake, Skin, Weight trends, Labs   REASON FOR ASSESSMENT:   Consult Hip fracture protocol  ASSESSMENT: Patient is a 69 yo male with history of gout, tobacco and ETOH use.  Fall at home resulting in left hip fracture. Scheduled for surgical repair-internal fixation 8/11. Vitamin D deficiency.  Patient receiving a Heart Healthy diet-meal intake not documented. Unable to interview at this time he is out of his room. Will follow meal intake and acceptance/adequacey of oral nutrition supplements.    Medications reviewed and include: Colace, Vitamin D, Nicoderm  Labs: BMP Latest Ref Rng & Units 02/15/2020 11/15/2017 08/07/2014  Glucose 70 - 99 mg/dL 277(A) 90 128(N)  BUN 8 - 23 mg/dL 10 6(L) 10  Creatinine 0.61 - 1.24 mg/dL 8.67(E) 7.20(N) 4.70(J)  BUN/Creat Ratio 10 - 24 - 10 14  Sodium 135 - 145 mmol/L 138 139 140  Potassium 3.5 - 5.1 mmol/L 4.3 3.7 3.7  Chloride 98 - 111 mmol/L 104 99 98  CO2 22 - 32 mmol/L 26 24 25   Calcium 8.9 - 10.3 mg/dL ) 9.0 9.3    Intake/Output Summary (Last 24 hours) at 02/17/2020 1553 Last data filed at 02/17/2020 1539 Gross per 24 hour  Intake 2000 ml  Output 800 ml  Net 1200 ml   Weight: usual range 62-66 kg the past 2 years. Patient at risk for weight loss and malnutrition related to his acute injury- fractures, surgery.  NUTRITION - FOCUSED PHYSICAL EXAM: Unable to complete Nutrition-Focused  physical exam at this time.  Patient not available. Will complete on follow up.  Diet Order:   Diet Order            Diet Heart Room service appropriate? Yes; Fluid consistency: Thin  Diet effective now                EDUCATION NEEDS:  Not appropriate for education at this time   Skin:  Skin Assessment: Skin Integrity Issues: Skin Integrity Issues:: Incisions Incisions: left hip  Last BM:  8/10  Height:   Ht Readings from Last 1 Encounters:  02/15/20 5\' 8"  (1.727 m)    Weight:   Wt Readings from Last 1 Encounters:  02/15/20 61.5 kg    Ideal Body Weight:   70 kg  BMI:  Body mass index is 20.62 kg/m.  Estimated Nutritional Needs:   Kcal:  1800-2000  Protein:  87-92 gr  Fluid:  1.7-1.9 liters daily   MS,RD,CSG,LDN Pager: 04/16/20

## 2020-02-17 NOTE — Anesthesia Preprocedure Evaluation (Signed)
Anesthesia Evaluation  Patient identified by MRN, date of birth, ID band Patient awake    Reviewed: Allergy & Precautions, H&P , NPO status , Patient's Chart, lab work & pertinent test results, reviewed documented beta blocker date and time   Airway Mallampati: II  TM Distance: >3 FB Neck ROM: full    Dental no notable dental hx.    Pulmonary COPD,  COPD inhaler, Current Smoker and Patient abstained from smoking.,    Pulmonary exam normal breath sounds clear to auscultation       Cardiovascular Exercise Tolerance: Good negative cardio ROS   Rhythm:regular Rate:Normal     Neuro/Psych negative neurological ROS  negative psych ROS   GI/Hepatic negative GI ROS, Neg liver ROS,   Endo/Other  negative endocrine ROS  Renal/GU negative Renal ROS  negative genitourinary   Musculoskeletal   Abdominal   Peds  Hematology negative hematology ROS (+)   Anesthesia Other Findings   Reproductive/Obstetrics negative OB ROS                             Anesthesia Physical Anesthesia Plan  ASA: III and emergent  Anesthesia Plan: Spinal   Post-op Pain Management:    Induction:   PONV Risk Score and Plan: 1 and Propofol infusion  Airway Management Planned: Nasal Cannula  Additional Equipment:   Intra-op Plan:   Post-operative Plan:   Informed Consent: I have reviewed the patients History and Physical, chart, labs and discussed the procedure including the risks, benefits and alternatives for the proposed anesthesia with the patient or authorized representative who has indicated his/her understanding and acceptance.     Dental Advisory Given  Plan Discussed with: CRNA  Anesthesia Plan Comments: (Patient denies home anticoagulation.  At the time of induction, it will be more than 6 hours since the last dose of SQ heparin.  Therefore, there is no contraindication to neuraxial anesthesia.)         Anesthesia Quick Evaluation

## 2020-02-17 NOTE — Progress Notes (Signed)
PROGRESS NOTE  Victor Shields EYE:[id number] DOB: [birth year] DOA: 02/15/2020 PCP: Junie Spencer, FNP  Brief History:  Victor Shields a 69 y.o.malewith medical history significant ofCOPD, tobacco abuse (ongoing) and gout arthritis; who presented to the hospital after experiencing fall at home and been unable to stand up and bear weight on his left hip. Patient denies LOC, but expressed feeling mildly lightheaded prior to falling. He normally uses a rolling walker for ambulation and expressed having previous fall approx 2 weeks prior to admission after tripping while walking. He continued to ambulate using his walker, but his pain progressed to the point where it was unbearable and he was not able to bear weight.  As a result, he presented for further evaluationPatient denies fever, chills, SOB, nausea, vomiting, melena, dysuria, hematuria, hematochezia and any other complaints.  His pain was 8/10 in intensity localized in his left hip, no radiated and worsen with any movement or attempt to bear weight.  ED Course:COVID negative, no acute ischemic changes on EKG, overall stable blood work for him and with hip images demonstrating closed intertrochanteric left hip fracture. Orthopedic surgery consulted and planning surgical repair. [facility] called to admit patient for further evaluation and management.  Assessment/Plan: closed left hip fracture -Continue as needed analgesics -Low vitamin D level appreciated (19.72)--start repletion. -Orthopedic surgery service on board and planning for surgical repair on 02/17/20 -After surgical repair PT/OT evaluation to determine discharge pathways.  tobacco abuse -Cessation counseling provided -Nicotine patch has been offered; patient said that he will use it as needed only.  COPD -No requiring oxygen supplementation and currently no wheezing -Continue home maintenance/rescue bronchodilator regimen.  hyperglycemia -due to  stress -A1c 4.5 -Follow CBGs with bloodwork and treat with sliding scale insulin as needed if > 200.  history of gout -No acute flare appreciated -Continue as needed analgesics.      Status is: Inpatient  Remains inpatient appropriate because:Inpatient level of care appropriate due to severity of illness Patient requiring surgery for his left hip fracture.  Dispo: The patient is from: Home              Anticipated d/c is to: SNF              Anticipated d/c date is: 2 days              Patient currently is not medically stable to d/c.        Family Communication:   Spouse updated at bedside 8/11  Consultants:  Ortho--[place]  Code Status:  FULL   DVT Prophylaxis:  [place] Heparin    Procedures: As Listed in Progress Note Above  Antibiotics: None       Subjective: Patient denies fevers, chills, headache, chest pain, dyspnea, nausea, vomiting, diarrhea, abdominal pain, dysuria, hematuria, hematochezia, and melena.   Objective: Vitals:   02/16/20 2024 02/16/20 2129 02/17/20 0514 02/17/20 0847  BP:  (!) 121/55 130/60   Pulse:  (!) 109 94   Resp:  20 18   Temp:  99.3 F (37.4 C) 98.6 F (37 C)   TempSrc:   Oral   SpO2: 95% 97% 96% 95%  Weight:      Height:        Intake/Output Summary (Last 24 hours) at 02/17/2020 1128 Last data filed at 02/17/2020 0500 Gross per 24 hour  Intake --  Output 200 ml  Net -200 ml   Weight change:  Exam:  General:  Pt is alert, follows commands appropriately, not in acute distress  HEENT: No icterus, No thrush, No neck mass, [place]/AT  Cardiovascular: RRR, S1/S2, no rubs, no gallops  Respiratory: diminished BS.  Bibasilar crackles. No wheeze  Abdomen: Soft/+BS, non tender, non distended, no guarding  Extremities: No edema, No lymphangitis, No petechiae, No rashes, no synovitis   Data Reviewed: I have personally reviewed following labs and imaging studies Basic Metabolic Panel: Recent Labs  Lab  02/15/20 1024  NA 138  K 4.3  CL 104  CO2 26  GLUCOSE 148*  BUN 10  CREATININE 0.57*  CALCIUM 8.4*   Liver Function Tests: No results for input(s): AST, ALT, ALKPHOS, BILITOT, PROT, ALBUMIN in the last 168 hours. No results for input(s): LIPASE, AMYLASE in the last 168 hours. No results for input(s): AMMONIA in the last 168 hours. Coagulation Profile: Recent Labs  Lab 02/15/20 1024  INR 1.2   CBC: Recent Labs  Lab 02/15/20 1024 02/16/20 0420  WBC 6.2 7.5  NEUTROABS 4.4  --   HGB 12.8* 11.6*  HCT 38.8* 36.1*  MCV 97.5 98.6  PLT 185 203   Cardiac Enzymes: No results for input(s): CKTOTAL, CKMB, CKMBINDEX, TROPONINI in the last 168 hours. BNP: Invalid input(s): POCBNP CBG: Recent Labs  Lab 02/16/20 0345  GLUCAP 122*   HbA1C: Recent Labs    02/15/20 1024  HGBA1C 4.5*   Urine analysis: No results found for: COLORURINE, APPEARANCEUR, LABSPEC, PHURINE, GLUCOSEU, HGBUR, BILIRUBINUR, KETONESUR, PROTEINUR, UROBILINOGEN, NITRITE, LEUKOCYTESUR Sepsis Labs: @LABRCNTIP (procalcitonin:4,lacticidven:4) ) Recent Results (from the past 240 hour(s))  SARS Coronavirus 2 by RT PCR (hospital order, performed in [facility] hospital lab) Nasopharyngeal Nasopharyngeal Swab     Status: None   Collection Time: 02/15/20  1:16 PM   Specimen: Nasopharyngeal Swab  Result Value Ref Range Status   SARS Coronavirus 2 NEGATIVE NEGATIVE Final    Comment: (NOTE) SARS-CoV-2 target nucleic acids are NOT DETECTED.  The SARS-CoV-2 RNA is generally detectable in upper and lower respiratory specimens during the acute phase of infection. The lowest concentration of SARS-CoV-2 viral copies this assay can detect is 250 copies / mL. A negative result does not preclude SARS-CoV-2 infection and should not be used as the sole basis for treatment or other patient management decisions.  A negative result may occur with improper specimen collection / handling, submission of specimen other than  nasopharyngeal swab, presence of viral mutation(s) within the areas targeted by this assay, and inadequate number of viral copies (<250 copies / mL). A negative result must be combined with clinical observations, patient history, and epidemiological information.  Fact Sheet for Patients:   04/16/20  Fact Sheet for Healthcare Providers: BoilerBrush.com.cy  This test is not yet approved or  cleared by the https://pope.com/ FDA and has been authorized for detection and/or diagnosis of SARS-CoV-2 by FDA under an Emergency Use Authorization (EUA).  This EUA will remain in effect (meaning this test can be used) for the duration of the COVID-19 declaration under Section 564(b)(1) of the Act, 21 U.S.C. section 360bbb-3(b)(1), unless the authorization is terminated or revoked sooner.  Performed at [facility], [address] [place], [place]   Surgical PCR screen     Status: None   Collection Time: 02/16/20 12:01 PM   Specimen: Nasal Mucosa; Nasal Swab  Result Value Ref Range Status   MRSA, PCR NEGATIVE NEGATIVE Final   Staphylococcus aureus NEGATIVE NEGATIVE Final    Comment: (NOTE) The Xpert SA Assay (  FDA approved for NASAL specimens in patients 57 years of age and older), is one component of a comprehensive surveillance program. It is not intended to diagnose infection nor to guide or monitor treatment. Performed at [facility], [address], [place], [place] [address]      Scheduled Meds: . docusate sodium  100 mg Oral BID  . heparin  5,000 Units Subcutaneous Q8H  . mometasone-formoterol  2 puff Inhalation BID  . mupirocin ointment  1 application Nasal BID  . nicotine  21 mg Transdermal Daily  . povidone-iodine  2 application Topical Once  . Vitamin D (Ergocalciferol)  50,000 Units Oral Q7 days   Continuous Infusions: . methocarbamol (ROBAXIN) IV    . vancomycin      Procedures/Studies: DG Chest Portable 1  View  Result Date: 02/15/2020 CLINICAL DATA:  Preop hip fracture. EXAM: PORTABLE CHEST 1 VIEW COMPARISON:  None. FINDINGS: Trachea is midline. Heart size normal. Thoracic aorta is calcified. Lungs are hyperinflated. Small right pleural effusion or pleural thickening. Lungs are otherwise grossly clear. IMPRESSION: 1. Small right pleural effusion or pleural thickening. 2. Hyperinflation. 3.  Aortic atherosclerosis (ICD10-I70.0). Electronically Signed   By: Leanna Battles M.D.   On: 02/15/2020 10:41   DG Hip Unilat W or Wo Pelvis 2-3 Views Left  Result Date: 02/15/2020 CLINICAL DATA:  Larey Seat 3 weeks ago and again today. Left hip pain. EXAM: DG HIP (WITH OR WITHOUT PELVIS) 2-3V LEFT COMPARISON:  None. FINDINGS: There is an impacted and possibly subacute intertrochanteric fracture of the left hip. The right hip is intact. The pubic symphysis and SI joints are intact. No pelvic fractures. Extensive vascular calcifications. IMPRESSION: Impacted acute or subacute intertrochanteric fracture of the left hip. Electronically Signed   By: Rudie Meyer M.D.   On: 02/15/2020 10:06    Catarina Hartshorn, DO  [facility]  If 7PM-7AM, please contact night-coverage [email or web address] Password [email or web address] 02/17/2020, 11:28 AM   LOS: 2 days

## 2020-02-17 NOTE — Brief Op Note (Signed)
02/17/2020  4:02 PM  PATIENT:  Victor Shields  69 y.o. male  PRE-OPERATIVE DIAGNOSIS:  LEFT HIP FRACTURES  POST-OPERATIVE DIAGNOSIS:  LEFT HIP FRACTURES  PROCEDURE:  Procedure(s): OPEN TREATMENT INTERNAL FIXATION LEFT HIP (Left)  SURGEON:  Surgeon(s) and Role:    Victor Hearing, MD - Primary  PHYSICIAN ASSISTANT:   ASSISTANTS: none   ANESTHESIA:   spinal  EBL:  100 mL   BLOOD ADMINISTERED:none  DRAINS: none   LOCAL MEDICATIONS USED:  MARCAINE     SPECIMEN:  No Specimen  DISPOSITION OF SPECIMEN:  N/A  COUNTS:  YES  TOURNIQUET:  * No tourniquets in log *  DICTATION: .Dragon Dictation  PLAN OF CARE: Admit to inpatient   PATIENT DISPOSITION:  PACU - hemodynamically stable.   Delay start of Pharmacological VTE agent (>24hrs) due to surgical blood loss or risk of bleeding: yes  History 69 year old male fractured his hip we believe June 25 finally came in 2 or 3 days ago because he was having hip pain on the left side.  X-rays revealed a left hip intertrochanteric fracture with possible second fracture in the femoral neck and head area.  To improve his ambulation status and ability and decrease his pain he agreed to proceed with internal fixation and stabilization of his left hip  Preop diagnosis left hip fracture  Postop diagnosis same  Procedure open treatment internal fixation left hip  Implant Richards hip screw with Synthes 6 5 cannulated screw with washer  He is 135 degree 4-hole plate with a compression screw and one 6.5-32 mm Synthes screw with washer  Operative findings the fracture was oblique in the coronal plane best seen on the lateral x-ray and there was mild motion at the fracture site the fracture was not completely healed  I did not see another fracture in the femoral head area.  Anesthetic was spinal  Assisted by Victor Shields  Blood loss 100 cc estimated  Sponge and needle count correct  Complications  Patient to PACU in  good condition  This is was done:  The patient was seen in the preop area the left it was confirmed as the surgical site along with patient identity and the surgeon's initials were applied to the left hip.  Implants were checked patient was brought to the OR.  After spinal anesthetic a Foley catheter was inserted the patient was placed on the fracture table with a padded perineal post padding for his well leg which was flexed at the hip abducted and externally rotated and padded  The left leg was placed in traction  C-arm was brought in and I manipulated the fracture and there was minimal motion at the fracture site it essentially had partially healed and external rotation.  Manipulated it further to try to improve the rotation and with manual pressure and internal rotation slight internal rotation was achieved  Leg was prepped and draped sterilely timeout was completed  I made the incision over the greater trochanter extended distally and further proximally than normal to allow clamping of the femoral neck area  After subcutaneous tissue was divided the fascia was split L-shaped incision was made in the vastus lateralis subperiosteal dissection exposed the proximal femur.  Then with the extended part of the incision I palpated over the femoral neck placed a pointed reduction clamp and a Cobb elevator over the anterior neck and force the leg into more internal rotation.  I then placed a pin with 135 degree guide into the  inferior portion of the neck on the AP view and center center portion on the lateral view  I did this with the clamp in place and proceeded to measure the pin and place a second pin above it.  Then placed a triple reamer over the pin which initially measured 110 mm we set the reamer 105 mm I overdrilled the pin and then placed the 135 degree plate with the 503 mm lag screw.  This was followed by removal of the second pin and placement of a 6.5 cannulated screw over a Synthes  guide pin which was threaded.  I placed a washer with the 6.5, 32 mm threaded screw  X-rays confirmed reduction and pin and screw and plate placement.  We continued fixation with 4.5 screws in the plate using 3.5 drill bit  We placed a compression screw as well  I took final x-rays I was satisfied with the reduction and placement of the hardware  The wound was copiously irrigated and closed with 0 Monocryl to repair the vastus lateralis followed by #1 Victor Shields to repair the fascia and 0 Monocryl to close the skin followed by staples.  We did inject 30 cc of Marcaine into the skin and subfascial layer  Sterile dressing was applied patient was taken recovery in stable condition  Patient will be on a protected weightbearing status for 6 weeks with a walker followed by 6 weeks of monitoring until 12 weeks to ensure fracture healing

## 2020-02-17 NOTE — Anesthesia Procedure Notes (Signed)
Spinal  Patient location during procedure: OR Start time: 02/17/2020 1:48 PM End time: 02/17/2020 1:51 PM Staffing Performed: resident/CRNA  Resident/CRNA: Pearson Grippe, CRNA Preanesthetic Checklist Completed: patient identified, IV checked, site marked, risks and benefits discussed, surgical consent, monitors and equipment checked, pre-op evaluation and timeout performed Spinal Block Patient position: sitting Prep: DuraPrep Patient monitoring: heart rate, cardiac monitor, continuous pulse ox and blood pressure Approach: midline Location: L3-4 Injection technique: single-shot Needle Needle type: Sprotte  Needle gauge: 24 G Needle length: 9 cm Assessment Sensory level: T4

## 2020-02-17 NOTE — Op Note (Signed)
02/17/2020 3:56 PM  History 69 year old male fractured his hip we believe June 25 finally came in 2 or 3 days ago because he was having hip pain on the left side.  X-rays revealed a left hip intertrochanteric fracture with possible second fracture in the femoral neck and head area.  To improve his ambulation status and ability and decrease his pain he agreed to proceed with internal fixation and stabilization of his left hip  Preop diagnosis left hip fracture  Postop diagnosis same  Procedure open treatment internal fixation left hip  Implant Richards hip screw with Synthes 6 5 cannulated screw with washer  He is 135 degree 4-hole plate with a compression screw and one 6.5-32 mm Synthes screw with washer  Operative findings the fracture was oblique in the coronal plane best seen on the lateral x-ray and there was mild motion at the fracture site the fracture was not completely healed  I did not see another fracture in the femoral head area.  Anesthetic was spinal  Assisted by [name]  Blood loss 100 cc estimated  Sponge and needle count correct  Complications  Patient to PACU in good condition  This is was done:  The patient was seen in the preop area the left it was confirmed as the surgical site along with patient identity and the surgeon's initials were applied to the left hip.  Implants were checked patient was brought to the OR.  After spinal anesthetic a Foley catheter was inserted the patient was placed on the fracture table with a padded perineal post padding for his well leg which was flexed at the hip abducted and externally rotated and padded  The left leg was placed in traction  C-arm was brought in and I manipulated the fracture and there was minimal motion at the fracture site it essentially had partially healed and external rotation.  Manipulated it further to try to improve the rotation and with manual pressure and internal rotation slight internal  rotation was achieved  Leg was prepped and draped sterilely timeout was completed  I made the incision over the greater trochanter extended distally and further proximally than normal to allow clamping of the femoral neck area  After subcutaneous tissue was divided the fascia was split L-shaped incision was made in the vastus lateralis subperiosteal dissection exposed the proximal femur.  Then with the extended part of the incision I palpated over the femoral neck placed a pointed reduction clamp and a Cobb elevator over the anterior neck and force the leg into more internal rotation.  I then placed a pin with 135 degree guide into the inferior portion of the neck on the AP view and center center portion on the lateral view  I did this with the clamp in place and proceeded to measure the pin and place a second pin above it.  Then placed a triple reamer over the pin which initially measured 110 mm we set the reamer 105 mm I overdrilled the pin and then placed the 135 degree plate with the 408 mm lag screw.  This was followed by removal of the second pin and placement of a 6.5 cannulated screw over a Synthes guide pin which was threaded.  I placed a washer with the 6.5, 32 mm threaded screw  X-rays confirmed reduction and pin and screw and plate placement.  We continued fixation with 4.5 screws in the plate using 3.5 drill bit  We placed a compression screw as well  I took final  x-rays I was satisfied with the reduction and placement of the hardware  The wound was copiously irrigated and closed with 0 Monocryl to repair the vastus lateralis followed by #1 [name] to repair the fascia and 0 Monocryl to close the skin followed by staples.  We did inject 30 cc of Marcaine into the skin and subfascial layer  Sterile dressing was applied patient was taken recovery in stable condition  Patient will be on a protected weightbearing status for 6 weeks with a walker followed by 6 weeks of monitoring  until 12 weeks to ensure fracture healing

## 2020-02-18 ENCOUNTER — Encounter (HOSPITAL_COMMUNITY): Payer: Self-pay | Admitting: Orthopedic Surgery

## 2020-02-18 DIAGNOSIS — Z720 Tobacco use: Secondary | ICD-10-CM | POA: Diagnosis not present

## 2020-02-18 DIAGNOSIS — S72002G Fracture of unspecified part of neck of left femur, subsequent encounter for closed fracture with delayed healing: Secondary | ICD-10-CM | POA: Diagnosis not present

## 2020-02-18 LAB — CBC
HCT: 30.3 % — ABNORMAL LOW (ref 39.0–52.0)
Hemoglobin: 10.1 g/dL — ABNORMAL LOW (ref 13.0–17.0)
MCH: 31.8 pg (ref 26.0–34.0)
MCHC: 33.3 g/dL (ref 30.0–36.0)
MCV: 95.3 fL (ref 80.0–100.0)
Platelets: 164 10*3/uL (ref 150–400)
RBC: 3.18 MIL/uL — ABNORMAL LOW (ref 4.22–5.81)
RDW: 12.3 % (ref 11.5–15.5)
WBC: 7.3 10*3/uL (ref 4.0–10.5)
nRBC: 0 % (ref 0.0–0.2)

## 2020-02-18 LAB — BASIC METABOLIC PANEL
Anion gap: 6 (ref 5–15)
BUN: 9 mg/dL (ref 8–23)
CO2: 26 mmol/L (ref 22–32)
Calcium: 7.9 mg/dL — ABNORMAL LOW (ref 8.9–10.3)
Chloride: 102 mmol/L (ref 98–111)
Creatinine, Ser: 0.37 mg/dL — ABNORMAL LOW (ref 0.61–1.24)
GFR calc Af Amer: >60 mL/min (ref >60)
GFR calc non Af Amer: >60 mL/min (ref >60)
Glucose, Bld: 86 mg/dL (ref 70–99)
Potassium: 3.7 mmol/L (ref 3.5–5.1)
Sodium: 134 mmol/L — ABNORMAL LOW (ref 135–145)

## 2020-02-18 LAB — MAGNESIUM: Magnesium: 1.4 mg/dL — ABNORMAL LOW (ref 1.7–2.4)

## 2020-02-18 MED ORDER — MAGNESIUM SULFATE 2 GM/50ML IV SOLN
2.0000 g | Freq: Once | INTRAVENOUS | Status: AC
  Administered 2020-02-18: 2 g via INTRAVENOUS
  Filled 2020-02-18: qty 50

## 2020-02-18 NOTE — Progress Notes (Signed)
Has voided today.  Dressing to left hip in place with scant amount of drainage. Ate well for breakfast but did not want lunch. Wife has been at bedside all day

## 2020-02-18 NOTE — Plan of Care (Signed)
  Problem: Acute Rehab PT Goals(only PT should resolve) Goal: Pt Will Go Supine/Side To Sit Outcome: Progressing Flowsheets (Taken 02/18/2020 1122) Pt will go Supine/Side to Sit:  with min guard assist  with minimal assist Goal: Patient Will Transfer Sit To/From Stand Outcome: Progressing Flowsheets (Taken 02/18/2020 1122) Patient will transfer sit to/from stand:  with min guard assist  with minimal assist Goal: Pt Will Transfer Bed To Chair/Chair To Bed Outcome: Progressing Flowsheets (Taken 02/18/2020 1122) Pt will Transfer Bed to Chair/Chair to Bed: with min assist Goal: Pt Will Ambulate Outcome: Progressing Flowsheets (Taken 02/18/2020 1122) Pt will Ambulate:  50 feet  with minimal assist  with rolling walker   11:23 AM, 02/18/20 Ocie Bob, MPT Physical Therapist with [facility] [phone] office [phone] mobile phone

## 2020-02-18 NOTE — Progress Notes (Addendum)
PROGRESS NOTE  Victor Shields BWG:[id number] DOB: May 23, 1951 DOA: 02/15/2020 PCP: Junie Spencer, FNP  Brief History:  Victor Shields a 69 y.o.malewith medical history significant ofCOPD, tobacco abuse (ongoing) and gout arthritis; who presented to the hospital after experiencing fall at home and been unable to stand up and bear weight on his left hip. Patient denies LOC, but expressed feeling mildly lightheaded prior to falling. He normally uses a rolling walker for ambulation and expressed having previous fall approx 2 weeks prior to admission after tripping while walking. He continued to ambulate using his walker, but his pain progressed to the point where it was unbearable and he was not able to bear weight.  As a result, he presented for further evaluationPatient denies fever, chills, SOB, nausea, vomiting, melena, dysuria, hematuria, hematochezia and any other complaints.  His pain was 8/10 in intensity localized in his left hip, no radiated and worsen with any movement or attempt to bear weight.  ED Course:COVID negative, no acute ischemic changes on EKG, overall stable blood work for him and with hip images demonstrating closed intertrochanteric left hip fracture. Orthopedic surgery consulted and planning surgical repair. [facility] called to admit patient for further evaluation and management.  Assessment/Plan: closed left hip fracture -Continue as needed analgesics -Low vitamin D level appreciated (19.72)--start repletion. -Orthopedic surgery service on board  -02/17/20--s/p ORIF -After surgical repair PT/OT-->SNF -DVT prophylaxis/pain control per ortho  tobacco abuse -Cessation counseling provided -Nicotine patch has been offered; patient said that he will use it as needed only.  COPD -No requiring oxygen supplementation and currently no wheezing -Continue home maintenance/rescuebronchodilator regimen.  hyperglycemia -due to stress -A1c  4.5 -Follow CBGswith bloodworkand treat with sliding scale insulin as neededif > 200.  history ofgout -No acute flare appreciated -Continue as needed analgesics.  Upper extremity edema -venous duplex  Hypomagnesemia -replete      Status is: Inpatient  Remains inpatient appropriate because:Inpatient level of care appropriate due to severity of illness Patient requiring surgery for his left hip fracture.  Dispo: The patient is from: Home  Anticipated d/c is to: SNF  Anticipated d/c date is: 8/13 if insurance auth obtained  Patient currently is not medically stable to d/c.        Family Communication:   Spouse updated at bedside 8/12  Consultants:  Ortho--[place]  Code Status:  FULL   DVT Prophylaxis:  [place] Heparin    Procedures: As Listed in Progress Note Above  Antibiotics: None   Subjective: Patient denies fevers, chills, headache, chest pain, dyspnea, nausea, vomiting, diarrhea, abdominal pain, dysuria, hematuria, hematochezia, and melena.   Objective: Vitals:   02/17/20 2359 02/18/20 0620 02/18/20 0830 02/18/20 1700  BP: 125/63 136/67  (!) 107/54  Pulse: 94 81  84  Resp: 16 18  17   Temp: 99.4 F (37.4 C) 98.6 F (37 C)  98.1 F (36.7 C)  TempSrc: Oral Oral  Oral  SpO2: 94% 95% 95% 95%  Weight:      Height:        Intake/Output Summary (Last 24 hours) at 02/18/2020 1850 Last data filed at 02/18/2020 1800 Gross per 24 hour  Intake 1328.74 ml  Output 600 ml  Net 728.74 ml   Weight change:  Exam:   General:  Pt is alert, follows commands appropriately, not in acute distress  HEENT: No icterus, No thrush, No neck mass, [place]/AT  Cardiovascular: RRR, S1/S2, no rubs, no gallops  Respiratory:  poor inspiratory effort;  Bibasilar crackles. No wheeze  Abdomen: Soft/+BS, non tender, non distended, no guarding  Extremities: No edema, No lymphangitis, No petechiae, No rashes, no  synovitis   Data Reviewed: I have personally reviewed following labs and imaging studies Basic Metabolic Panel: Recent Labs  Lab 02/15/20 1024 02/18/20 0440  NA 138 134*  K 4.3 3.7  CL 104 102  CO2 26 26  GLUCOSE 148* 86  BUN 10 9  CREATININE 0.57* 0.37*  CALCIUM 8.4* 7.9*  MG  --  1.4*   Liver Function Tests: No results for input(s): AST, ALT, ALKPHOS, BILITOT, PROT, ALBUMIN in the last 168 hours. No results for input(s): LIPASE, AMYLASE in the last 168 hours. No results for input(s): AMMONIA in the last 168 hours. Coagulation Profile: Recent Labs  Lab 02/15/20 1024  INR 1.2   CBC: Recent Labs  Lab 02/15/20 1024 02/16/20 0420 02/18/20 0440  WBC 6.2 7.5 7.3  NEUTROABS 4.4  --   --   HGB 12.8* 11.6* 10.1*  HCT 38.8* 36.1* 30.3*  MCV 97.5 98.6 95.3  PLT 185 203 164   Cardiac Enzymes: No results for input(s): CKTOTAL, CKMB, CKMBINDEX, TROPONINI in the last 168 hours. BNP: Invalid input(s): POCBNP CBG: Recent Labs  Lab 02/16/20 0345  GLUCAP 122*   HbA1C: No results for input(s): HGBA1C in the last 72 hours. Urine analysis: No results found for: COLORURINE, APPEARANCEUR, LABSPEC, PHURINE, GLUCOSEU, HGBUR, BILIRUBINUR, KETONESUR, PROTEINUR, UROBILINOGEN, NITRITE, LEUKOCYTESUR Sepsis Labs: @LABRCNTIP (procalcitonin:4,lacticidven:4) ) Recent Results (from the past 240 hour(s))  SARS Coronavirus 2 by RT PCR (hospital order, performed in [facility] hospital lab) Nasopharyngeal Nasopharyngeal Swab     Status: None   Collection Time: 02/15/20  1:16 PM   Specimen: Nasopharyngeal Swab  Result Value Ref Range Status   SARS Coronavirus 2 NEGATIVE NEGATIVE Final    Comment: (NOTE) SARS-CoV-2 target nucleic acids are NOT DETECTED.  The SARS-CoV-2 RNA is generally detectable in upper and lower respiratory specimens during the acute phase of infection. The lowest concentration of SARS-CoV-2 viral copies this assay can detect is 250 copies / mL. A negative result  does not preclude SARS-CoV-2 infection and should not be used as the sole basis for treatment or other patient management decisions.  A negative result may occur with improper specimen collection / handling, submission of specimen other than nasopharyngeal swab, presence of viral mutation(s) within the areas targeted by this assay, and inadequate number of viral copies (<250 copies / mL). A negative result must be combined with clinical observations, patient history, and epidemiological information.  Fact Sheet for Patients:   04/16/20  Fact Sheet for Healthcare Providers: BoilerBrush.com.cy  This test is not yet approved or  cleared by the https://pope.com/ FDA and has been authorized for detection and/or diagnosis of SARS-CoV-2 by FDA under an Emergency Use Authorization (EUA).  This EUA will remain in effect (meaning this test can be used) for the duration of the COVID-19 declaration under Section 564(b)(1) of the Act, 21 U.S.C. section 360bbb-3(b)(1), unless the authorization is terminated or revoked sooner.  Performed at [facility], [address], [place], [place]   Surgical PCR screen     Status: None   Collection Time: 02/16/20 12:01 PM   Specimen: Nasal Mucosa; Nasal Swab  Result Value Ref Range Status   MRSA, PCR NEGATIVE NEGATIVE Final   Staphylococcus aureus NEGATIVE NEGATIVE Final    Comment: (NOTE) The Xpert SA Assay (FDA approved for NASAL specimens in patients  12 years of age and older), is one component of a comprehensive surveillance program. It is not intended to diagnose infection nor to guide or monitor treatment. Performed at [facility], [address] [place], [place] [address]      Scheduled Meds: . aspirin EC  325 mg Oral Q breakfast  . celecoxib  200 mg Oral BID  . Chlorhexidine Gluconate Cloth  6 each Topical Daily  . docusate sodium  100 mg Oral BID  . docusate sodium  100 mg Oral  BID  . feeding supplement (ENSURE ENLIVE)  237 mL Oral BID BM  . gabapentin  300 mg Oral TID  . mometasone-formoterol  2 puff Inhalation BID  . multivitamin with minerals  1 tablet Oral Daily  . mupirocin ointment  1 application Nasal BID  . nicotine  21 mg Transdermal Daily  . nutrition supplement (JUVEN)  1 packet Oral BID BM  . traMADol  50 mg Oral Q6H  . Vitamin D (Ergocalciferol)  50,000 Units Oral Q7 days   Continuous Infusions: . sodium chloride 100 mL/hr at 02/17/20 1810  . methocarbamol (ROBAXIN) IV      Procedures/Studies: DG Chest Portable 1 View  Result Date: 02/15/2020 CLINICAL DATA:  Preop hip fracture. EXAM: PORTABLE CHEST 1 VIEW COMPARISON:  None. FINDINGS: Trachea is midline. Heart size normal. Thoracic aorta is calcified. Lungs are hyperinflated. Small right pleural effusion or pleural thickening. Lungs are otherwise grossly clear. IMPRESSION: 1. Small right pleural effusion or pleural thickening. 2. Hyperinflation. 3.  Aortic atherosclerosis (ICD10-I70.0). Electronically Signed   By: Leanna Battles M.D.   On: 02/15/2020 10:41   DG HIP OPERATIVE UNILAT W OR W/O PELVIS LEFT  Result Date: 02/17/2020 CLINICAL DATA:  Left hip fracture, intraoperative examination EXAM: OPERATIVE LEFT HIP (WITH PELVIS IF PERFORMED)  VIEWS TECHNIQUE: Fluoroscopic spot image(s) were submitted for interpretation post-operatively. COMPARISON:  02/15/2020 FINDINGS: Eleven fluoroscopic intraoperative radiographs demonstrate open reduction internal fixation of a a transcervical left femoral neck fracture with a dynamic hip screw and D rotational screw with fracture fragments in anatomic alignment on this limited examination. No unexpected fracture or dislocation. FLUOROSCOPY TIME:  4 minutes 32 seconds. Fluoroscopic dose: 91.58 mGy IMPRESSION: Intraoperative radiographs as described above. Electronically Signed   By: Helyn Numbers MD   On: 02/17/2020 17:47   DG Hip Unilat W or Wo Pelvis 2-3 Views  Left  Result Date: 02/15/2020 CLINICAL DATA:  Larey Seat 3 weeks ago and again today. Left hip pain. EXAM: DG HIP (WITH OR WITHOUT PELVIS) 2-3V LEFT COMPARISON:  None. FINDINGS: There is an impacted and possibly subacute intertrochanteric fracture of the left hip. The right hip is intact. The pubic symphysis and SI joints are intact. No pelvic fractures. Extensive vascular calcifications. IMPRESSION: Impacted acute or subacute intertrochanteric fracture of the left hip. Electronically Signed   By: Rudie Meyer M.D.   On: 02/15/2020 10:06    Catarina Hartshorn, DO  [facility]  If 7PM-7AM, please contact night-coverage [email or web address] Password [email or web address] 02/18/2020, 6:50 PM   LOS: 3 days

## 2020-02-18 NOTE — Evaluation (Signed)
Physical Therapy Evaluation Patient Details Name: Victor Shields MRN: [id number] DOB: 12-05-1950 Today's Date: 02/18/2020   History of Present Illness  Victor Shields is a 69 y.o. male s/p ORIF left hip on 02/17/20 with medical history significant of COPD, tobacco abuse (ongoing) and gout arthritis; who presented to the hospital after experiencing fall at home and been unable to stand up and bear weight on his left hip. Patient denies LOC, but expressed feeling mildly lightheaded prior to falling. He normally uses a rolling walker for ambulation and expressed having previous fall approx 1 week prior to this one after tripping while walking. Patient denies fever, chills, SOB, nausea, vomiting, melena, dysuria, hematuria, hematochezia and any other complaints.    Clinical Impression  Patient has difficulty sitting up at bedside, completing sit to stands and taking steps due to c/o severe pain left hip, very unsteady on feet with near fall and limited to a few side steps mostly dragging LLE due to pain.  Patient tolerated sitting up in chair with his spouse present after therapy.  Patient will benefit from continued physical therapy in hospital and recommended venue below to increase strength, balance, endurance for safe ADLs and gait.     Follow Up Recommendations SNF    Equipment Recommendations  None recommended by PT    Recommendations for Other Services       Precautions / Restrictions Precautions Precautions: Fall Restrictions Weight Bearing Restrictions: Yes LLE Weight Bearing: Weight bearing as tolerated      Mobility  Bed Mobility Overal bed mobility: Needs Assistance Bed Mobility: Supine to Sit     Supine to sit: Mod assist     General bed mobility comments: increased time, labored movement, had diffiuclty scooting self forward to EOB  Transfers Overall transfer level: Needs assistance Equipment used: Rolling walker (2 wheeled) Transfers: Sit to/from  [employer] Sit to Stand: Mod assist Stand pivot transfers: Mod assist       General transfer comment: very unsteady with c/o severe pain left hip when standing  Ambulation/Gait Ambulation/Gait assistance: Mod assist;Max assist Gait Distance (Feet): 4 Feet Assistive device: Rolling walker (2 wheeled) Gait Pattern/deviations: Decreased step length - right;Decreased step length - left;Decreased stance time - left;Decreased stride length;Antalgic Gait velocity: slow   General Gait Details: limited to 3-4 very unsteady labored steps with mostly dragging of LLE due to increased hip pain  Stairs            Wheelchair Mobility    Modified Rankin (Stroke Patients Only)       Balance Overall balance assessment: Needs assistance Sitting-balance support: Feet supported;No upper extremity supported Sitting balance-Leahy Scale: Fair Sitting balance - Comments: fair/good seated at EOB   Standing balance support: During functional activity;Bilateral upper extremity supported Standing balance-Leahy Scale: Poor Standing balance comment: using RW                             Pertinent Vitals/Pain Pain Assessment: Faces Faces Pain Scale: Hurts whole lot Pain Location: left hip 8/10 with movement, 5/10 at rest Pain Descriptors / Indicators: Sore;Grimacing;Guarding Pain Intervention(s): Limited activity within patient's tolerance;Monitored during session;Repositioned;Premedicated before session    Home Living Family/patient expects to be discharged to:: Private residence Living Arrangements: Spouse/significant other Available Help at Discharge: Family;Available PRN/intermittently Type of Home: House Home Access: Stairs to enter   Entrance Stairs-Number of Steps: 2 with bilateral posts he can use Home Layout: One level Home  Equipment: Dan Humphreys - 4 wheels;Shower seat;Bedside commode      Prior Function Level of Independence: Independent with assistive  device(s)         Comments: household ambulator using RW since fall 3 weeks ago, 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, occasional driving     Higher education careers adviser        Extremity/Trunk Assessment   Upper Extremity Assessment Upper Extremity Assessment: Generalized weakness    Lower Extremity Assessment Lower Extremity Assessment: Generalized weakness;LLE deficits/detail LLE Deficits / Details: grossly -3/5 LLE: Unable to fully assess due to pain LLE Sensation: WNL LLE Coordination: WNL    Cervical / Trunk Assessment Cervical / Trunk Assessment: Normal  Communication   Communication: No difficulties  Cognition Arousal/Alertness: Awake/alert Behavior During Therapy: WFL for tasks assessed/performed Overall Cognitive Status: Within Functional Limits for tasks assessed                                        General Comments      Exercises     Assessment/Plan    PT Assessment Patient needs continued PT services  PT Problem List Decreased strength;Decreased activity tolerance;Decreased balance;Decreased mobility       PT Treatment Interventions DME instruction;Gait training;Stair training;Functional mobility training;Therapeutic activities;Therapeutic exercise;Patient/family education    PT Goals (Current goals can be found in the Care Plan section)  Acute Rehab PT Goals Patient Stated Goal: return home with family to assist PT Goal Formulation: With patient/family Time For Goal Achievement: 03/03/20 Potential to Achieve Goals: Good    Frequency Min 4X/week   Barriers to discharge        Co-evaluation               AM-PAC PT "6 Clicks" Mobility  Outcome Measure Help needed turning from your back to your side while in a flat bed without using bedrails?: A Lot Help needed moving from lying on your back to sitting on the side of a flat bed without using bedrails?: A Lot Help needed moving to and from a bed to a chair (including a  wheelchair)?: A Lot Help needed standing up from a chair using your arms (e.g., wheelchair or bedside chair)?: A Lot Help needed to walk in hospital room?: A Lot Help needed climbing 3-5 steps with a railing? : Total 6 Click Score: 11    End of Session   Activity Tolerance: Patient tolerated treatment well;Patient limited by lethargy Patient left: in chair;with call bell/phone within reach;with family/visitor present Nurse Communication: Mobility status PT Visit Diagnosis: Unsteadiness on feet (R26.81);Other abnormalities of gait and mobility (R26.89);Muscle weakness (generalized) (M62.81)    Time: 3212-2482 PT Time Calculation (min) (ACUTE ONLY): 28 min   Charges:   PT Evaluation $PT Eval Moderate Complexity: 1 Mod PT Treatments $Therapeutic Activity: 23-37 mins        11:18 AM, 02/18/20 Ocie Bob, MPT Physical Therapist with [facility] [phone] office [phone] mobile phone

## 2020-02-19 ENCOUNTER — Inpatient Hospital Stay (HOSPITAL_COMMUNITY): Payer: Medicare HMO

## 2020-02-19 DIAGNOSIS — W19XXXD Unspecified fall, subsequent encounter: Secondary | ICD-10-CM

## 2020-02-19 DIAGNOSIS — S72002G Fracture of unspecified part of neck of left femur, subsequent encounter for closed fracture with delayed healing: Secondary | ICD-10-CM | POA: Diagnosis not present

## 2020-02-19 DIAGNOSIS — Z720 Tobacco use: Secondary | ICD-10-CM | POA: Diagnosis not present

## 2020-02-19 LAB — CBC
HCT: 31.5 % — ABNORMAL LOW (ref 39.0–52.0)
Hemoglobin: 10.3 g/dL — ABNORMAL LOW (ref 13.0–17.0)
MCH: 31 pg (ref 26.0–34.0)
MCHC: 32.7 g/dL (ref 30.0–36.0)
MCV: 94.9 fL (ref 80.0–100.0)
Platelets: 200 10*3/uL (ref 150–400)
RBC: 3.32 MIL/uL — ABNORMAL LOW (ref 4.22–5.81)
RDW: 12.5 % (ref 11.5–15.5)
WBC: 8.6 10*3/uL (ref 4.0–10.5)
nRBC: 0 % (ref 0.0–0.2)

## 2020-02-19 LAB — BASIC METABOLIC PANEL
Anion gap: 7 (ref 5–15)
BUN: 9 mg/dL (ref 8–23)
CO2: 25 mmol/L (ref 22–32)
Calcium: 7.9 mg/dL — ABNORMAL LOW (ref 8.9–10.3)
Chloride: 101 mmol/L (ref 98–111)
Creatinine, Ser: 0.43 mg/dL — ABNORMAL LOW (ref 0.61–1.24)
GFR calc Af Amer: >60 mL/min (ref >60)
GFR calc non Af Amer: >60 mL/min (ref >60)
Glucose, Bld: 87 mg/dL (ref 70–99)
Potassium: 3.3 mmol/L — ABNORMAL LOW (ref 3.5–5.1)
Sodium: 133 mmol/L — ABNORMAL LOW (ref 135–145)

## 2020-02-19 LAB — MAGNESIUM: Magnesium: 1.7 mg/dL (ref 1.7–2.4)

## 2020-02-19 MED ORDER — POTASSIUM CHLORIDE CRYS ER 20 MEQ PO TBCR
20.0000 meq | EXTENDED_RELEASE_TABLET | Freq: Once | ORAL | Status: AC
  Administered 2020-02-19: 20 meq via ORAL
  Filled 2020-02-19: qty 1

## 2020-02-19 MED ORDER — NICOTINE 21 MG/24HR TD PT24: 21.0000 mg | MEDICATED_PATCH | Freq: Every day | TRANSDERMAL | 0 refills | Status: DC

## 2020-02-19 MED ORDER — JUVEN PO PACK: 1.0000 | PACK | Freq: Two times a day (BID) | ORAL | 0 refills | Status: DC

## 2020-02-19 MED ORDER — MAGNESIUM OXIDE 400 (241.3 MG) MG PO TABS
400.0000 mg | ORAL_TABLET | Freq: Every day | ORAL | Status: DC
  Administered 2020-02-19 – 2020-02-22 (×4): 400 mg via ORAL
  Filled 2020-02-19 (×4): qty 1

## 2020-02-19 MED ORDER — ADULT MULTIVITAMIN W/MINERALS CH: 1.0000 | ORAL_TABLET | Freq: Every day | ORAL | Status: DC

## 2020-02-19 MED ORDER — DICLOFENAC SODIUM 1 % EX GEL
2.0000 g | Freq: Four times a day (QID) | CUTANEOUS | Status: DC
  Administered 2020-02-19 – 2020-02-22 (×8): 2 g via TOPICAL
  Filled 2020-02-19: qty 100

## 2020-02-19 MED ORDER — DOCUSATE SODIUM 100 MG PO CAPS: 100.0000 mg | ORAL_CAPSULE | Freq: Two times a day (BID) | ORAL | 0 refills | Status: DC

## 2020-02-19 MED ORDER — GABAPENTIN 300 MG PO CAPS: 300.0000 mg | ORAL_CAPSULE | Freq: Three times a day (TID) | ORAL | Status: DC

## 2020-02-19 MED ORDER — TRAMADOL HCL 50 MG PO TABS: 50.0000 mg | ORAL_TABLET | Freq: Four times a day (QID) | ORAL | 0 refills | Status: DC | PRN

## 2020-02-19 MED ORDER — ENSURE ENLIVE PO LIQD: 237.0000 mL | Freq: Two times a day (BID) | ORAL | 12 refills | Status: DC

## 2020-02-19 MED ORDER — MAGNESIUM OXIDE 400 (241.3 MG) MG PO TABS: 400.0000 mg | ORAL_TABLET | Freq: Every day | ORAL | 0 refills | Status: DC

## 2020-02-19 MED ORDER — CELECOXIB 200 MG PO CAPS: 200.0000 mg | ORAL_CAPSULE | Freq: Two times a day (BID) | ORAL | Status: DC

## 2020-02-19 MED ORDER — ASPIRIN 325 MG PO TBEC: 325.0000 mg | DELAYED_RELEASE_TABLET | Freq: Every day | ORAL | 0 refills | Status: DC

## 2020-02-19 MED ORDER — VITAMIN D (ERGOCALCIFEROL) 1.25 MG (50000 UNIT) PO CAPS: 50000.0000 [IU] | ORAL_CAPSULE | ORAL | Status: DC

## 2020-02-19 NOTE — Progress Notes (Signed)
Patient ID: Victor Shields, male   DOB: Mar 10, 1951, 69 y.o.   MRN: [id number]  Postop day 2 status post Richards hip screw left hip for delayed presentation of left hip fracture  Mr. Vaughan Sine says he did okay with therapy took a few steps pain seems to be controlled  CBC Latest Ref Rng & Units 02/19/2020 02/18/2020 02/16/2020  WBC 4.0 - 10.5 K/uL 8.6 7.3 7.5  Hemoglobin 13.0 - 17.0 g/dL 10.3(L) 10.1(L) 11.6(L)  Hematocrit 39 - 52 % 31.5(L) 30.3(L) 36.1(L)  Platelets 150 - 400 K/uL 200 164 203    Hemoglobin is stable continue physical therapy  Postop plan  DVT prevention 30 days from surgery  Staples out in 2 weeks  First postop visit in 4 weeks  Patient will be on a protected weightbearing status for 6 weeks with a walker followed by 6 weeks of monitoring until 12 weeks to ensure fracture healing

## 2020-02-19 NOTE — Progress Notes (Addendum)
Physical Therapy Treatment Patient Details Name: Victor Shields MRN: [id number] DOB: 09-15-1950 Today's Date: 02/19/2020    History of Present Illness Victor Shields is a 69 y.o. male s/p ORIF left hip on 02/17/20 with medical history significant of COPD, tobacco abuse (ongoing) and gout arthritis; who presented to the hospital after experiencing fall at home and been unable to stand up and bear weight on his left hip. Patient denies LOC, but expressed feeling mildly lightheaded prior to falling. He normally uses a rolling walker for ambulation and expressed having previous fall approx 1 week prior to this one after tripping while walking. Patient denies fever, chills, SOB, nausea, vomiting, melena, dysuria, hematuria, hematochezia and any other complaints.    PT Comments    The patient required excessive VC's in order to properly sit EOB from supine position today. When brought into standing position the patient required excessive VC's in order to bring legs further under body to promote proper weight bearing position. Patient requires frequent instruction/cueing throughout session with limited carry over.  Once in a standing position the patient was able to take a few steps with max assist in order to transfer to the chair. Patient tolerated sitting up in chair with wife present at bedside after therapy. PLAN: The patient will continue to benefit from skilled physical therapy services in hospital at recommended venue below in order to improve balance, gait, and ADL's to promote independence in functional activities.     Follow Up Recommendations  SNF     Equipment Recommendations  None recommended by PT    Recommendations for Other Services  None recommended by PT.      Precautions / Restrictions Precautions Precautions: Fall;Posterior Hip Precaution Booklet Issued: No Precaution Comments: s/p L hip ORIF DVT proflaxis; WBAT Restrictions Weight Bearing Restrictions: No LLE  Weight Bearing: Weight bearing as tolerated    Mobility  Bed Mobility Overal bed mobility: Needs Assistance Bed Mobility: Supine to Sit     Supine to sit: Min assist     General bed mobility comments: increased time, labored movement, had diffiuclty scooting self forward to EOB  Transfers Overall transfer level: Needs assistance Equipment used: Rolling walker (2 wheeled) Transfers: Sit to/from [employer] Sit to Stand: Mod assist Stand pivot transfers: Max assist       General transfer comment: very unsteady; maintained legs in front of body; struggled with cues to reposition BLE's for proper standing mechanics  Ambulation/Gait Ambulation/Gait assistance: Max assist Gait Distance (Feet): 4 Feet Assistive device: Rolling walker (2 wheeled) Gait Pattern/deviations: Decreased step length - right;Decreased step length - left;Decreased stance time - left;Decreased stride length;Antalgic Gait velocity: slow, labored steps   General Gait Details: limited to 3-4 very unsteady labored steps with decreased B coordination   Stairs             Wheelchair Mobility    Modified Rankin (Stroke Patients Only)       Balance Overall balance assessment: Needs assistance Sitting-balance support: Feet supported;Bilateral upper extremity supported Sitting balance-Leahy Scale: Fair Sitting balance - Comments: fair/good seated at EOB   Standing balance support: During functional activity;Bilateral upper extremity supported Standing balance-Leahy Scale: Poor Standing balance comment: using RW                            Cognition Arousal/Alertness: Awake/alert Behavior During Therapy: WFL for tasks assessed/performed Overall Cognitive Status: Within Functional Limits for tasks assessed  Exercises General Exercises - Lower Extremity Ankle Circles/Pumps: AROM;Supine;10 reps;Both    General  Comments        Pertinent Vitals/Pain Pain Score: 6  Pain Location: 6/10 pain L hip taken at rest Pain Descriptors / Indicators: Sore;Grimacing;Guarding Pain Intervention(s): Limited activity within patient's tolerance;Monitored during session    Home Living                      Prior Function            PT Goals (current goals can now be found in the care plan section) Acute Rehab PT Goals Patient Stated Goal: return home with family to assist PT Goal Formulation: With patient/family Time For Goal Achievement: 03/03/20 Potential to Achieve Goals: Good Progress towards PT goals: Progressing toward goals    Frequency    Min 4X/week      PT Plan Current plan remains appropriate    Co-evaluation              AM-PAC PT "6 Clicks" Mobility   Outcome Measure  Help needed turning from your back to your side while in a flat bed without using bedrails?: A Little Help needed moving from lying on your back to sitting on the side of a flat bed without using bedrails?: A Lot Help needed moving to and from a bed to a chair (including a wheelchair)?: A Lot Help needed standing up from a chair using your arms (e.g., wheelchair or bedside chair)?: A Lot Help needed to walk in hospital room?: A Lot Help needed climbing 3-5 steps with a railing? : Total 6 Click Score: 12    End of Session Equipment Utilized During Treatment: Gait belt Activity Tolerance: Patient tolerated treatment well;Patient limited by fatigue;Patient limited by pain Patient left: in chair;with call bell/phone within reach;with family/visitor present Nurse Communication: Mobility status PT Visit Diagnosis: Unsteadiness on feet (R26.81);Other abnormalities of gait and mobility (R26.89);Muscle weakness (generalized) (M62.81)     Time: 1607-3710 PT Time Calculation (min) (ACUTE ONLY): 17 min  Charges:  $Therapeutic Activity: 8-22 mins                     1:22 PM , 02/19/20 Lorin Picket,  SPT Physical Therapy with [place]  [facility] [phone] office      This qualified practitioner was present in the room guiding the student in service delivery. Therapy student was participating in the provision of services, and the practitioner was not engaged in treating another patient or doing other tasks at the same time. 1:22 PM, 02/19/20 Wyman Songster PT, DPT Physical Therapist at [facility]

## 2020-02-19 NOTE — Plan of Care (Signed)

## 2020-02-19 NOTE — Discharge Summary (Signed)
Physician Discharge Summary  Victor Shields [id number] DOB: 1951-06-26 DOA: 02/15/2020  PCP: Junie Spencer, FNP  Admit date: 02/15/2020 Discharge date: 02/19/2020  Admitted From: Home Disposition:  SNF  Recommendations for Outpatient Follow-up:  1. Follow up with PCP in 1-2 weeks 2. Please obtain BMP/CBC in one week    Discharge Condition: Stable CODE STATUS: FULL Diet recommendation: Regular   Brief/Interim Summary: Victor Shields a 69 y.o.malewith medical history significant ofCOPD, tobacco abuse (ongoing) and gout arthritis; who presented to the hospital after experiencing fall at home and been unable to stand up and bear weight on his left hip. Patient denies LOC, but expressed feeling mildly lightheaded prior to falling. He normally uses a rolling walker for ambulation and expressed having previous fall approx2weeksprior to admissionafter tripping while walking.He continued to ambulate using his walker, but his pain progressed to the point where it was unbearable and he was not able to bear weight. As a result, he presented for further evaluationPatient denies fever, chills, SOB, nausea, vomiting, melena, dysuria, hematuria, hematochezia and any other complaints.  His pain was 8/10 in intensity localized in his left hip, no radiated and worsen with any movement or attempt to bear weight.  ED Course:COVID negative, no acute ischemic changes on EKG, overall stable blood work for him and with hip images demonstrating closed intertrochanteric left hip fracture. Orthopedic surgery consulted and planning surgical repair. [facility] called to admit patient for further evaluation and management.  Discharge Diagnoses:  closed left hip fracture -Continue as needed analgesics -Low vitamin D level appreciated (19.72)--start repletion. -Orthopedic surgery service on board  -02/17/20--s/p ORIF -After surgical repair PT/OT-->SNF -DVT prophylaxis/pain control per  ortho-->tramadol prn pain and ASA 325 mg for DVT prophylaxis  tobacco abuse -Cessation counseling provided -Nicotine patch has been offered; patient said that he will use it as needed only.  COPD -No requiring oxygen supplementation and currently no wheezing -Continue home maintenance/rescuebronchodilator regimen. -stable on RA  hyperglycemia -due to stress -A1c 4.5 -Follow CBGswith bloodworkand treat with sliding scale insulin as neededif > 200.  history ofgout -No acute flare appreciated -Continue as needed analgesics.  Upper extremity edema -venous duplex--neg  Hypomagnesemia/Hypokalemia -replete -start mag ox daily   Discharge Instructions   Allergies as of 02/19/2020      Reactions   Penicillins Swelling      Medication List    STOP taking these medications   fluconazole 150 MG tablet Commonly known as: DIFLUCAN   naproxen sodium 220 MG tablet Commonly known as: ALEVE     TAKE these medications   albuterol 108 (90 Base) MCG/ACT inhaler Commonly known as: VENTOLIN HFA TAKE 2 PUFFS BY MOUTH EVERY 6 HOURS AS NEEDED FOR WHEEZE OR SHORTNESS OF BREATH   aspirin 325 MG EC tablet Take 1 tablet (325 mg total) by mouth daily with breakfast. X 28 days Start taking on: February 20, 2020   budesonide-formoterol 80-4.5 MCG/ACT inhaler Commonly known as: SYMBICORT Inhale 2 puffs into the lungs 2 (two) times daily.   celecoxib 200 MG capsule Commonly known as: CELEBREX Take 1 capsule (200 mg total) by mouth 2 (two) times daily. X 28 days   clotrimazole 1 % cream Commonly known as: LOTRIMIN Apply 1 application topically 2 (two) times daily. What changed:   when to take this  reasons to take this   diclofenac Sodium 1 % Gel Commonly known as: Voltaren Apply 2 g topically 4 (four) times daily.   docusate sodium 100 MG capsule Commonly  known as: COLACE Take 1 capsule (100 mg total) by mouth 2 (two) times daily.   feeding supplement (ENSURE  ENLIVE) Liqd Take 237 mLs by mouth 2 (two) times daily between meals.   nutrition supplement (JUVEN) Pack Take 1 packet by mouth 2 (two) times daily between meals.   gabapentin 300 MG capsule Commonly known as: NEURONTIN Take 1 capsule (300 mg total) by mouth 3 (three) times daily. X 28 days   magnesium oxide 400 (241.3 Mg) MG tablet Commonly known as: MAG-OX Take 1 tablet (400 mg total) by mouth daily.   multivitamin with minerals Tabs tablet Take 1 tablet by mouth daily. Start taking on: February 20, 2020   nicotine 21 mg/24hr patch Commonly known as: NICODERM CQ - dosed in mg/24 hours Place 1 patch (21 mg total) onto the skin daily. Start taking on: February 20, 2020   nystatin powder Commonly known as: MYCOSTATIN/NYSTOP Apply 1 application topically 3 (three) times daily.   traMADol 50 MG tablet Commonly known as: ULTRAM Take 1 tablet (50 mg total) by mouth every 6 (six) hours as needed for moderate pain.   trolamine salicylate 10 % cream Commonly known as: ASPERCREME Apply 1 application topically as needed for muscle pain.   Vitamin D (Ergocalciferol) 1.25 MG (50000 UNIT) Caps capsule Commonly known as: DRISDOL Take 1 capsule (50,000 Units total) by mouth every 7 (seven) days. Start taking on: February 23, 2020       Contact information for after-discharge care    Destination    [facility] Preferred SNF .   Service: Skilled Nursing Contact information: [address] [place] [address] [phone]                 Allergies  Allergen Reactions  . Penicillins Swelling    Consultations:  ortho   Procedures/Studies: DG Chest Portable 1 View  Result Date: 02/15/2020 CLINICAL DATA:  Preop hip fracture. EXAM: PORTABLE CHEST 1 VIEW COMPARISON:  None. FINDINGS: Trachea is midline. Heart size normal. Thoracic aorta is calcified. Lungs are hyperinflated. Small right pleural effusion or pleural  thickening. Lungs are otherwise grossly clear. IMPRESSION: 1. Small right pleural effusion or pleural thickening. 2. Hyperinflation. 3.  Aortic atherosclerosis (ICD10-I70.0). Electronically Signed   By: Leanna Battles M.D.   On: 02/15/2020 10:41   DG HIP OPERATIVE UNILAT W OR W/O PELVIS LEFT  Result Date: 02/17/2020 CLINICAL DATA:  Left hip fracture, intraoperative examination EXAM: OPERATIVE LEFT HIP (WITH PELVIS IF PERFORMED)  VIEWS TECHNIQUE: Fluoroscopic spot image(s) were submitted for interpretation post-operatively. COMPARISON:  02/15/2020 FINDINGS: Eleven fluoroscopic intraoperative radiographs demonstrate open reduction internal fixation of a a transcervical left femoral neck fracture with a dynamic hip screw and D rotational screw with fracture fragments in anatomic alignment on this limited examination. No unexpected fracture or dislocation. FLUOROSCOPY TIME:  4 minutes 32 seconds. Fluoroscopic dose: 91.58 mGy IMPRESSION: Intraoperative radiographs as described above. Electronically Signed   By: Helyn Numbers MD   On: 02/17/2020 17:47   DG Hip Unilat W or Wo Pelvis 2-3 Views Left  Result Date: 02/15/2020 CLINICAL DATA:  Larey Seat 3 weeks ago and again today. Left hip pain. EXAM: DG HIP (WITH OR WITHOUT PELVIS) 2-3V LEFT COMPARISON:  None. FINDINGS: There is an impacted and possibly subacute intertrochanteric fracture of the left hip. The right hip is intact. The pubic symphysis and SI joints are intact. No pelvic fractures. Extensive vascular calcifications. IMPRESSION: Impacted acute or subacute intertrochanteric  fracture of the left hip. Electronically Signed   By: Rudie MeyerP.  Gallerani M.D.   On: 02/15/2020 10:06        Discharge Exam: Vitals:   02/19/20 0424 02/19/20 1021  BP: (!) 141/58   Pulse: 88   Resp: 16   Temp: (!) 97.5 F (36.4 C)   SpO2: 95% 96%   Vitals:   02/18/20 2053 02/18/20 2355 02/19/20 0424 02/19/20 1021  BP:  131/69 (!) 141/58   Pulse:  90 88   Resp: 18 17 16      Temp:  98.8 F (37.1 C) (!) 97.5 F (36.4 C)   TempSrc:      SpO2: 94% 95% 95% 96%  Weight:      Height:        General: Pt is alert, awake, not in acute distress Cardiovascular: RRR, S1/S2 +, no rubs, no gallops Respiratory: diminshed BS but CTA Abdominal: Soft, NT, ND, bowel sounds + Extremities: no edema, no cyanosis   The results of significant diagnostics from this hospitalization (including imaging, microbiology, ancillary and laboratory) are listed below for reference.    Significant Diagnostic Studies: DG Chest Portable 1 View  Result Date: 02/15/2020 CLINICAL DATA:  Preop hip fracture. EXAM: PORTABLE CHEST 1 VIEW COMPARISON:  None. FINDINGS: Trachea is midline. Heart size normal. Thoracic aorta is calcified. Lungs are hyperinflated. Small right pleural effusion or pleural thickening. Lungs are otherwise grossly clear. IMPRESSION: 1. Small right pleural effusion or pleural thickening. 2. Hyperinflation. 3.  Aortic atherosclerosis (ICD10-I70.0). Electronically Signed   By: Leanna BattlesMelinda  Blietz M.D.   On: 02/15/2020 10:41   DG HIP OPERATIVE UNILAT W OR W/O PELVIS LEFT  Result Date: 02/17/2020 CLINICAL DATA:  Left hip fracture, intraoperative examination EXAM: OPERATIVE LEFT HIP (WITH PELVIS IF PERFORMED)  VIEWS TECHNIQUE: Fluoroscopic spot image(s) were submitted for interpretation post-operatively. COMPARISON:  02/15/2020 FINDINGS: Eleven fluoroscopic intraoperative radiographs demonstrate open reduction internal fixation of a a transcervical left femoral neck fracture with a dynamic hip screw and D rotational screw with fracture fragments in anatomic alignment on this limited examination. No unexpected fracture or dislocation. FLUOROSCOPY TIME:  4 minutes 32 seconds. Fluoroscopic dose: 91.58 mGy IMPRESSION: Intraoperative radiographs as described above. Electronically Signed   By: Helyn NumbersAshesh  Parikh MD   On: 02/17/2020 17:47   DG Hip Unilat W or Wo Pelvis 2-3 Views Left  Result Date:  02/15/2020 CLINICAL DATA:  Larey SeatFell 3 weeks ago and again today. Left hip pain. EXAM: DG HIP (WITH OR WITHOUT PELVIS) 2-3V LEFT COMPARISON:  None. FINDINGS: There is an impacted and possibly subacute intertrochanteric fracture of the left hip. The right hip is intact. The pubic symphysis and SI joints are intact. No pelvic fractures. Extensive vascular calcifications. IMPRESSION: Impacted acute or subacute intertrochanteric fracture of the left hip. Electronically Signed   By: Rudie MeyerP.  Gallerani M.D.   On: 02/15/2020 10:06     Microbiology: Recent Results (from the past 240 hour(s))  SARS Coronavirus 2 by RT PCR (hospital order[facility]) Nasopharyngeal Nasopharyngeal Swab     Status: None   Collection Time: 02/15/20  1:16 PM   Specimen: Nasopharyngeal Swab  Result Value Ref Range Status   SARS Coronavirus 2 NEGATIVE NEGATIVE Final    Comment: (NOTE) SARS-CoV-2 target nucleic acids are NOT DETECTED.  The SARS-CoV-2 RNA is generally detectable in upper and lower respiratory specimens during the acute phase of infection. The lowest concentration of SARS-CoV-2 viral copies this assay can detect is 250 copies /  mL. A negative result does not preclude SARS-CoV-2 infection and should not be used as the sole basis for treatment or other patient management decisions.  A negative result may occur with improper specimen collection / handling, submission of specimen other than nasopharyngeal swab, presence of viral mutation(s) within the areas targeted by this assay, and inadequate number of viral copies (<250 copies / mL). A negative result must be combined with clinical observations, patient history, and epidemiological information.  Fact Sheet for Patients:   BoilerBrush.com.cy  Fact Sheet for Healthcare Providers: https://pope.com/  This test is not yet approved or  cleared by the [place] FDA and has been authorized for  detection and/or diagnosis of SARS-CoV-2 by FDA under an Emergency Use Authorization (EUA).  This EUA will remain in effect (meaning this test can be used) for the duration of the COVID-19 declaration under Section 564(b)(1) of the Act, 21 U.S.C. section 360bbb-3(b)(1), unless the authorization is terminated or revoked sooner.  Performed at [facility], [address], [place], [place] [address]   Surgical PCR screen     Status: None   Collection Time: 02/16/20 12:01 PM   Specimen: Nasal Mucosa; Nasal Swab  Result Value Ref Range Status   MRSA, PCR NEGATIVE NEGATIVE Final   Staphylococcus aureus NEGATIVE NEGATIVE Final    Comment: (NOTE) The Xpert SA Assay (FDA approved for NASAL specimens in patients 47 years of age and older), is one component of a comprehensive surveillance program. It is not intended to diagnose infection nor to guide or monitor treatment. Performed at [facility], [address] [place], [place] [address]      Labs: Basic Metabolic Panel: Recent Labs  Lab 02/15/20 1024 02/15/20 1024 02/18/20 0440 02/19/20 0553  NA 138  --  134* 133*  K 4.3   < > 3.7 3.3*  CL 104  --  102 101  CO2 26  --  26 25  GLUCOSE 148*  --  86 87  BUN 10  --  9 9  CREATININE 0.57*  --  0.37* 0.43*  CALCIUM 8.4*  --  7.9* 7.9*  MG  --   --  1.4* 1.7   < > = values in this interval not displayed.   Liver Function Tests: No results for input(s): AST, ALT, ALKPHOS, BILITOT, PROT, ALBUMIN in the last 168 hours. No results for input(s): LIPASE, AMYLASE in the last 168 hours. No results for input(s): AMMONIA in the last 168 hours. CBC: Recent Labs  Lab 02/15/20 1024 02/16/20 0420 02/18/20 0440 02/19/20 0553  WBC 6.2 7.5 7.3 8.6  NEUTROABS 4.4  --   --   --   HGB 12.8* 11.6* 10.1* 10.3*  HCT 38.8* 36.1* 30.3* 31.5*  MCV 97.5 98.6 95.3 94.9  PLT 185 203 164 200   Cardiac Enzymes: No results for input(s): CKTOTAL, CKMB, CKMBINDEX, TROPONINI in the last 168  hours. BNP: Invalid input(s): POCBNP CBG: Recent Labs  Lab 02/16/20 0345  GLUCAP 122*    Time coordinating discharge:  36 minutes  Signed:  Catarina Hartshorn, DO [facility] Pager: [phone] 02/19/2020, 1:53 PM

## 2020-02-19 NOTE — TOC Progression Note (Signed)
Transition of Care [facility]) - Progression Note    Patient Details  Name: Victor Shields MRN: [id number] Date of Birth: [birth year]  [facility] CM/SW Contact  Barry Brunner, LCSW Phone Number: 02/19/2020, 4:36 PM  Clinical Narrative:    CSW contacted [facility] to confirm patient's bed with facility. [facility] agreeable to take patient, but reported that auth may not have been started on 08/13 due to the staff member conducting the auth having to leave early. CSW requested a follow up for updated status of insurance auth. CSW did not receive follow up. CSW contacted facility 2 additional times to inquire about ins auth. Elease Hashimoto with [facility] not available to provide CSW with updated ins auth status. TOC to follow.    Expected Discharge Plan: Home w Home Health Services Barriers to Discharge: Continued Medical Work up  Expected Discharge Plan and Services Expected Discharge Plan: Home w Home Health Services       Living arrangements for the past 2 months: Single Family Home Expected Discharge Date: 02/19/20                                     Social Determinants of Health (SDOH) Interventions    Readmission Risk Interventions No flowsheet data found.

## 2020-02-20 DIAGNOSIS — S72002G Fracture of unspecified part of neck of left femur, subsequent encounter for closed fracture with delayed healing: Secondary | ICD-10-CM | POA: Diagnosis not present

## 2020-02-20 DIAGNOSIS — Z720 Tobacco use: Secondary | ICD-10-CM | POA: Diagnosis not present

## 2020-02-20 DIAGNOSIS — W19XXXD Unspecified fall, subsequent encounter: Secondary | ICD-10-CM | POA: Diagnosis not present

## 2020-02-20 LAB — BASIC METABOLIC PANEL
Anion gap: 7 (ref 5–15)
BUN: 9 mg/dL (ref 8–23)
CO2: 28 mmol/L (ref 22–32)
Calcium: 8 mg/dL — ABNORMAL LOW (ref 8.9–10.3)
Chloride: 100 mmol/L (ref 98–111)
Creatinine, Ser: 0.39 mg/dL — ABNORMAL LOW (ref 0.61–1.24)
GFR calc Af Amer: >60 mL/min (ref >60)
GFR calc non Af Amer: >60 mL/min (ref >60)
Glucose, Bld: 104 mg/dL — ABNORMAL HIGH (ref 70–99)
Potassium: 3.8 mmol/L (ref 3.5–5.1)
Sodium: 135 mmol/L (ref 135–145)

## 2020-02-20 LAB — CBC
HCT: 32 % — ABNORMAL LOW (ref 39.0–52.0)
Hemoglobin: 10.4 g/dL — ABNORMAL LOW (ref 13.0–17.0)
MCH: 30.9 pg (ref 26.0–34.0)
MCHC: 32.5 g/dL (ref 30.0–36.0)
MCV: 95 fL (ref 80.0–100.0)
Platelets: 227 10*3/uL (ref 150–400)
RBC: 3.37 MIL/uL — ABNORMAL LOW (ref 4.22–5.81)
RDW: 12.7 % (ref 11.5–15.5)
WBC: 8.6 10*3/uL (ref 4.0–10.5)
nRBC: 0 % (ref 0.0–0.2)

## 2020-02-20 LAB — MAGNESIUM: Magnesium: 1.7 mg/dL (ref 1.7–2.4)

## 2020-02-20 MED ORDER — BISACODYL 10 MG RE SUPP: 10.0000 mg | Freq: Once | RECTAL | Status: DC

## 2020-02-20 NOTE — Discharge Summary (Addendum)
Physician Discharge Summary  Victor Shields VZC:[id number] DOB: 1951-06-02 DOA: 02/15/2020  PCP: Victor Spencer, FNP  Admit date: 02/15/2020 Discharge date: 02/20/2020  Admitted From: Home Disposition: SNF  Recommendations for Outpatient Follow-up:  1. Follow up with PCP in 1-2 weeks 2. Please obtain BMP/CBC in one week  Staples out in 2 weeks  First postop visit in 4 weeks  Patient will be on a protected weightbearing status for 6 weeks with a walker followed by 6 weeks of monitoring until 12 weeks to ensure fracture healing    Discharge Condition: Stable CODE STATUS: FULL Diet recommendation: regular   Brief/Interim Summary: Victor Shields a 69 y.o.malewith medical history significant ofCOPD, tobacco abuse (ongoing) and gout arthritis; who presented to the hospital after experiencing fall at home and been unable to stand up and bear weight on his left hip. Patient denies LOC, but expressed feeling mildly lightheaded prior to falling. He normally uses a rolling walker for ambulation and expressed having previous fall approx2weeksprior to admissionafter tripping while walking.He continued to ambulate using his walker, but his pain progressed to the point where it was unbearable and he was not able to bear weight. As a result, he presented for further evaluationPatient denies fever, chills, SOB, nausea, vomiting, melena, dysuria, hematuria, hematochezia and any other complaints.  His pain was 8/10 in intensity localized in his left hip, no radiated and worsen with any movement or attempt to bear weight.  ED Course:COVID negative, no acute ischemic changes on EKG, overall stable blood work for him and with hip images demonstrating closed intertrochanteric left hip fracture. Orthopedic surgery consulted and planning surgical repair. [facility] called to admit patient for further evaluation and management.  Discharge Diagnoses:  closed left hip fracture -Continue  as needed analgesics -Low vitamin D level appreciated (19.72)--start repletion. -Orthopedic surgery service on board -02/17/20--s/p ORIF -After surgical repair PT/OT-->SNF -DVT prophylaxis/pain control per ortho-->tramadol prn pain and ASA 325 mg for DVT prophylaxis  tobacco abuse -Cessation counseling provided -Nicotine patch has been offered; patient said that he will use it as needed only.  COPD -No requiring oxygen supplementation and currently no wheezing -Continue home maintenance/rescuebronchodilator regimen. -stable on RA  hyperglycemia -due to stress -A1c 4.5 -Follow CBGswith bloodworkand treat with sliding scale insulin as neededif > 200.  history ofgout -No acute flare appreciated -Continue as needed analgesics.  Upper extremity edema -venous duplex--neg  Hypomagnesemia/Hypokalemia -replete -started mag ox daily  Discharge Instructions   Allergies as of 02/20/2020      Reactions   Penicillins Swelling      Medication List    STOP taking these medications   fluconazole 150 MG tablet Commonly known as: DIFLUCAN   naproxen sodium 220 MG tablet Commonly known as: ALEVE     TAKE these medications   albuterol 108 (90 Base) MCG/ACT inhaler Commonly known as: VENTOLIN HFA TAKE 2 PUFFS BY MOUTH EVERY 6 HOURS AS NEEDED FOR WHEEZE OR SHORTNESS OF BREATH   aspirin 325 MG EC tablet Take 1 tablet (325 mg total) by mouth daily with breakfast. X 28 days   budesonide-formoterol 80-4.5 MCG/ACT inhaler Commonly known as: SYMBICORT Inhale 2 puffs into the lungs 2 (two) times daily.   celecoxib 200 MG capsule Commonly known as: CELEBREX Take 1 capsule (200 mg total) by mouth 2 (two) times daily. X 28 days   clotrimazole 1 % cream Commonly known as: LOTRIMIN Apply 1 application topically 2 (two) times daily. What changed:   when to take this  reasons to take this   diclofenac Sodium 1 % Gel Commonly known as: Voltaren Apply 2 g topically 4  (four) times daily.   docusate sodium 100 MG capsule Commonly known as: COLACE Take 1 capsule (100 mg total) by mouth 2 (two) times daily.   feeding supplement (ENSURE ENLIVE) Liqd Take 237 mLs by mouth 2 (two) times daily between meals.   nutrition supplement (JUVEN) Pack Take 1 packet by mouth 2 (two) times daily between meals.   gabapentin 300 MG capsule Commonly known as: NEURONTIN Take 1 capsule (300 mg total) by mouth 3 (three) times daily. X 28 days   magnesium oxide 400 (241.3 Mg) MG tablet Commonly known as: MAG-OX Take 1 tablet (400 mg total) by mouth daily.   multivitamin with minerals Tabs tablet Take 1 tablet by mouth daily.   nicotine 21 mg/24hr patch Commonly known as: NICODERM CQ - dosed in mg/24 hours Place 1 patch (21 mg total) onto the skin daily.   nystatin powder Commonly known as: MYCOSTATIN/NYSTOP Apply 1 application topically 3 (three) times daily.   traMADol 50 MG tablet Commonly known as: ULTRAM Take 1 tablet (50 mg total) by mouth every 6 (six) hours as needed for moderate pain.   trolamine salicylate 10 % cream Commonly known as: ASPERCREME Apply 1 application topically as needed for muscle pain.   Vitamin D (Ergocalciferol) 1.25 MG (50000 UNIT) Caps capsule Commonly known as: DRISDOL Take 1 capsule (50,000 Units total) by mouth every 7 (seven) days. Start taking on: February 23, 2020       Contact information for after-discharge care    Dest[facility] .   Service: Skilled Nursing Contact information: [address] [place] [id number] [phone]                 Allergies  Allergen Reactions  . Penicillins Swelling    Consultations:  ortho   Procedures/Studies: US Venous Img Upper Bilat (DVT)  Result Date: 02/19/2020 CLINICAL DATA:  Left upper extremity edema EXAM: BILATERAL UPPER EXTREMITY VENOUS DOPPLER ULTRASOUND TECHNIQUE:  Gray-scale sonography with graded compression, as well as color Doppler and duplex ultrasound were performed to evaluate the bilateral upper extremity deep venous systems from the level of the subclavian vein and including the jugular, axillary, basilic, radial, ulnar and upper cephalic vein. Spectral Doppler was utilized to evaluate flow at rest and with distal augmentation maneuvers. COMPARISON:  None. FINDINGS: RIGHT UPPER EXTREMITY Internal Jugular Vein: No evidence of thrombus. Normal compressibility, respiratory phasicity and response to augmentation. Subclavian Vein: No evidence of thrombus. Normal compressibility, respiratory phasicity and response to augmentation. Axillary Vein: No evidence of thrombus. Normal compressibility, respiratory phasicity and response to augmentation. Cephalic Vein: No evidence of thrombus. Normal compressibility, respiratory phasicity and response to augmentation. Basilic Vein: No evidence of thrombus. Normal compressibility, respiratory phasicity and response to augmentation. Brachial Veins: No evidence of thrombus. Normal compressibility, respiratory phasicity and response to augmentation. Radial Veins: No evidence of thrombus. Normal compressibility, respiratory phasicity and response to augmentation. Ulnar Veins: No evidence of thrombus. Normal compressibility, respiratory phasicity and response to augmentation. Venous Reflux:  None. Other Findings:  None. LEFT UPPER EXTREMITY Internal Jugular Vein: No evidence of thrombus. Normal compressibility, respiratory phasicity and response to augmentation. Subclavian Vein: No evidence of thrombus. Normal compressibility, respiratory phasicity and response to augmentation. Axillary Vein: No evidence of thrombus. Normal compressibility, respiratory phasicity and response to augmentation. Cephalic Vein:  No evidence of thrombus. Normal compressibility, respiratory phasicity and response to augmentation. Basilic Vein: No evidence of  thrombus. Normal compressibility, respiratory phasicity and response to augmentation. Brachial Veins: No evidence of thrombus. Normal compressibility, respiratory phasicity and response to augmentation. Radial Veins: No evidence of thrombus. Normal compressibility, respiratory phasicity and response to augmentation. Ulnar Veins: No evidence of thrombus. Normal compressibility, respiratory phasicity and response to augmentation. Venous Reflux:  None. Other Findings:  Subcutaneous edema is noted. IMPRESSION: No evidence of DVT within either upper extremity. Electronically Signed   By: Alcide Clever M.D.   On: 02/19/2020 14:24   DG Chest Portable 1 View  Result Date: 02/15/2020 CLINICAL DATA:  Preop hip fracture. EXAM: PORTABLE CHEST 1 VIEW COMPARISON:  None. FINDINGS: Trachea is midline. Heart size normal. Thoracic aorta is calcified. Lungs are hyperinflated. Small right pleural effusion or pleural thickening. Lungs are otherwise grossly clear. IMPRESSION: 1. Small right pleural effusion or pleural thickening. 2. Hyperinflation. 3.  Aortic atherosclerosis (ICD10-I70.0). Electronically Signed   By: Leanna Battles M.D.   On: 02/15/2020 10:41   DG HIP OPERATIVE UNILAT W OR W/O PELVIS LEFT  Result Date: 02/17/2020 CLINICAL DATA:  Left hip fracture, intraoperative examination EXAM: OPERATIVE LEFT HIP (WITH PELVIS IF PERFORMED)  VIEWS TECHNIQUE: Fluoroscopic spot image(s) were submitted for interpretation post-operatively. COMPARISON:  02/15/2020 FINDINGS: Eleven fluoroscopic intraoperative radiographs demonstrate open reduction internal fixation of a a transcervical left femoral neck fracture with a dynamic hip screw and D rotational screw with fracture fragments in anatomic alignment on this limited examination. No unexpected fracture or dislocation. FLUOROSCOPY TIME:  4 minutes 32 seconds. Fluoroscopic dose: 91.58 mGy IMPRESSION: Intraoperative radiographs as described above. Electronically Signed   By: Helyn Numbers MD   On: 02/17/2020 17:47   DG Hip Unilat W or Wo Pelvis 2-3 Views Left  Result Date: 02/15/2020 CLINICAL DATA:  Larey Seat 3 weeks ago and again today. Left hip pain. EXAM: DG HIP (WITH OR WITHOUT PELVIS) 2-3V LEFT COMPARISON:  None. FINDINGS: There is an impacted and possibly subacute intertrochanteric fracture of the left hip. The right hip is intact. The pubic symphysis and SI joints are intact. No pelvic fractures. Extensive vascular calcifications. IMPRESSION: Impacted acute or subacute intertrochanteric fracture of the left hip. Electronically Signed   By: Rudie Meyer M.D.   On: 02/15/2020 10:06         Discharge Exam: Vitals:   02/20/20 0458 02/20/20 0829  BP: (!) 132/54   Pulse: 94   Resp: 14   Temp: 97.6 F (36.4 C)   SpO2: 99% 97%   Vitals:   02/19/20 2119 02/19/20 2206 02/20/20 0458 02/20/20 0829  BP:  108/79 (!) 132/54   Pulse: 84 89 94   Resp: 18 16 14    Temp:  98.4 F (36.9 C) 97.6 F (36.4 C)   TempSrc:  Oral Oral   SpO2: 95% 97% 99% 97%  Weight:      Height:        General: Pt is alert, awake, not in acute distress Cardiovascular: RRR, S1/S2 +, no rubs, no gallops Respiratory: bibasilar crackles. No wheeze Abdominal: Soft, NT, ND, bowel sounds + Extremities: no edema, no cyanosis   The results of significant diagnostics from this hospitalization (including imaging, microbiology, ancillary and laboratory) are listed below for reference.    Significant Diagnostic Studies: Venous Img Upper Bilat (DVT)  Result Date: 02/19/2020 CLINICAL DATA:  Left upper extremity edema EXAM: BILATERAL UPPER EXTREMITY VENOUS DOPPLER ULTRASOUND TECHNIQUE:  Gray-scale sonography with graded compression, as well as color Doppler and duplex ultrasound were performed to evaluate the bilateral upper extremity deep venous systems from the level of the subclavian vein and including the jugular, axillary, basilic, radial, ulnar and upper cephalic vein. Spectral Doppler was  utilized to evaluate flow at rest and with distal augmentation maneuvers. COMPARISON:  None. FINDINGS: RIGHT UPPER EXTREMITY Internal Jugular Vein: No evidence of thrombus. Normal compressibility, respiratory phasicity and response to augmentation. Subclavian Vein: No evidence of thrombus. Normal compressibility, respiratory phasicity and response to augmentation. Axillary Vein: No evidence of thrombus. Normal compressibility, respiratory phasicity and response to augmentation. Cephalic Vein: No evidence of thrombus. Normal compressibility, respiratory phasicity and response to augmentation. Basilic Vein: No evidence of thrombus. Normal compressibility, respiratory phasicity and response to augmentation. Brachial Veins: No evidence of thrombus. Normal compressibility, respiratory phasicity and response to augmentation. Radial Veins: No evidence of thrombus. Normal compressibility, respiratory phasicity and response to augmentation. Ulnar Veins: No evidence of thrombus. Normal compressibility, respiratory phasicity and response to augmentation. Venous Reflux:  None. Other Findings:  None. LEFT UPPER EXTREMITY Internal Jugular Vein: No evidence of thrombus. Normal compressibility, respiratory phasicity and response to augmentation. Subclavian Vein: No evidence of thrombus. Normal compressibility, respiratory phasicity and response to augmentation. Axillary Vein: No evidence of thrombus. Normal compressibility, respiratory phasicity and response to augmentation. Cephalic Vein: No evidence of thrombus. Normal compressibility, respiratory phasicity and response to augmentation. Basilic Vein: No evidence of thrombus. Normal compressibility, respiratory phasicity and response to augmentation. Brachial Veins: No evidence of thrombus. Normal compressibility, respiratory phasicity and response to augmentation. Radial Veins: No evidence of thrombus. Normal compressibility, respiratory phasicity and response to augmentation.  Ulnar Veins: No evidence of thrombus. Normal compressibility, respiratory phasicity and response to augmentation. Venous Reflux:  None. Other Findings:  Subcutaneous edema is noted. IMPRESSION: No evidence of DVT within either upper extremity. Electronically Signed   By: Alcide Clever M.D.   On: 02/19/2020 14:24   DG Chest Portable 1 View  Result Date: 02/15/2020 CLINICAL DATA:  Preop hip fracture. EXAM: PORTABLE CHEST 1 VIEW COMPARISON:  None. FINDINGS: Trachea is midline. Heart size normal. Thoracic aorta is calcified. Lungs are hyperinflated. Small right pleural effusion or pleural thickening. Lungs are otherwise grossly clear. IMPRESSION: 1. Small right pleural effusion or pleural thickening. 2. Hyperinflation. 3.  Aortic atherosclerosis (ICD10-I70.0). Electronically Signed   By: Leanna Battles M.D.   On: 02/15/2020 10:41   DG HIP OPERATIVE UNILAT W OR W/O PELVIS LEFT  Result Date: 02/17/2020 CLINICAL DATA:  Left hip fracture, intraoperative examination EXAM: OPERATIVE LEFT HIP (WITH PELVIS IF PERFORMED)  VIEWS TECHNIQUE: Fluoroscopic spot image(s) were submitted for interpretation post-operatively. COMPARISON:  02/15/2020 FINDINGS: Eleven fluoroscopic intraoperative radiographs demonstrate open reduction internal fixation of a a transcervical left femoral neck fracture with a dynamic hip screw and D rotational screw with fracture fragments in anatomic alignment on this limited examination. No unexpected fracture or dislocation. FLUOROSCOPY TIME:  4 minutes 32 seconds. Fluoroscopic dose: 91.58 mGy IMPRESSION: Intraoperative radiographs as described above. Electronically Signed   By: Helyn Numbers MD   On: 02/17/2020 17:47   DG Hip Unilat W or Wo Pelvis 2-3 Views Left  Result Date: 02/15/2020 CLINICAL DATA:  Larey Seat 3 weeks ago and again today. Left hip pain. EXAM: DG HIP (WITH OR WITHOUT PELVIS) 2-3V LEFT COMPARISON:  None. FINDINGS: There is an impacted and possibly subacute intertrochanteric fracture  of the left hip. The right hip is intact. The pubic symphysis  and SI joints are intact. No pelvic fractures. Extensive vascular calcifications. IMPRESSION: Impacted acute or subacute intertrochanteric fracture of the left hip. Electronically Signed   By: Rudie Meyer M.D.   On: 02/15/2020 10:06     Microbiology: Recent Results (from the past 240 hour(s))  SARS Coronavirus 2 by RT PCR (hospital order, performed in [facility] hospital lab) Nasopharyngeal Nasopharyngeal Swab     Status: None   Collection Time: 02/15/20  1:16 PM   Specimen: Nasopharyngeal Swab  Result Value Ref Range Status   SARS Coronavirus 2 NEGATIVE NEGATIVE Final    Comment: (NOTE) SARS-CoV-2 target nucleic acids are NOT DETECTED.  The SARS-CoV-2 RNA is generally detectable in upper and lower respiratory specimens during the acute phase of infection. The lowest concentration of SARS-CoV-2 viral copies this assay can detect is 250 copies / mL. A negative result does not preclude SARS-CoV-2 infection and should not be used as the sole basis for treatment or other patient management decisions.  A negative result may occur with improper specimen collection / handling, submission of specimen other than nasopharyngeal swab, presence of viral mutation(s) within the areas targeted by this assay, and inadequate number of viral copies (<250 copies / mL). A negative result must be combined with clinical observations, patient history, and epidemiological information.  Fact Sheet for Patients:   BoilerBrush.com.cy  Fact Sheet for Healthcare Providers: https://pope.com/  This test is not yet approved or  cleared by the [place] FDA and has been authorized for detection and/or diagnosis of SARS-CoV-2 by FDA under an Emergency Use Authorization (EUA).  This EUA will remain in effect (meaning this test can be used) for the duration of the COVID-19 declaration under Section  564(b)(1) of the Act, 21 U.S.C. section 360bbb-3(b)(1), unless the authorization is terminated or revoked sooner.  Performed at [facility], [address] [place], [place] [address]   Surgical PCR screen     Status: None   Collection Time: 02/16/20 12:01 PM   Specimen: Nasal Mucosa; Nasal Swab  Result Value Ref Range Status   MRSA, PCR NEGATIVE NEGATIVE Final   Staphylococcus aureus NEGATIVE NEGATIVE Final    Comment: (NOTE) The Xpert SA Assay (FDA approved for NASAL specimens in patients 63 years of age and older), is one component of a comprehensive surveillance program. It is not intended to diagnose infection nor to guide or monitor treatment. Performed at [facility], [address] [place], [place] [address]      Labs: Basic Metabolic Panel: Recent Labs  Lab 02/15/20 1024 02/15/20 1024 02/18/20 0440 02/18/20 0440 02/19/20 0553 02/20/20 0616  NA 138  --  134*  --  133* 135  K 4.3   < > 3.7   < > 3.3* 3.8  CL 104  --  102  --  101 100  CO2 26  --  26  --  25 28  GLUCOSE 148*  --  86  --  87 104*  BUN 10  --  9  --  9 9  CREATININE 0.57*  --  0.37*  --  0.43* 0.39*  CALCIUM 8.4*  --  7.9*  --  7.9* 8.0*  MG  --   --  1.4*  --  1.7 1.7   < > = values in this interval not displayed.   Liver Function Tests: No results for input(s): AST, ALT, ALKPHOS, BILITOT, PROT, ALBUMIN in the last 168 hours. No results for input(s): LIPASE, AMYLASE in the last 168  hours. No results for input(s): AMMONIA in the last 168 hours. CBC: Recent Labs  Lab 02/15/20 1024 02/16/20 0420 02/18/20 0440 02/19/20 0553 02/20/20 0616  WBC 6.2 7.5 7.3 8.6 8.6  NEUTROABS 4.4  --   --   --   --   HGB 12.8* 11.6* 10.1* 10.3* 10.4*  HCT 38.8* 36.1* 30.3* 31.5* 32.0*  MCV 97.5 98.6 95.3 94.9 95.0  PLT 185 203 164 200 227   Cardiac Enzymes: No results for input(s): CKTOTAL, CKMB, CKMBINDEX, TROPONINI in the last 168 hours. BNP: Invalid input(s): POCBNP CBG: Recent Labs  Lab  02/16/20 0345  GLUCAP 122*    Time coordinating discharge:  36 minutes  Signed:  Catarina Hartshorn, DO [facility] Pager: [phone] 02/20/2020, 10:48 AM

## 2020-02-21 DIAGNOSIS — Z720 Tobacco use: Secondary | ICD-10-CM | POA: Diagnosis not present

## 2020-02-21 DIAGNOSIS — S72002K Fracture of unspecified part of neck of left femur, subsequent encounter for closed fracture with nonunion: Secondary | ICD-10-CM

## 2020-02-21 MED ORDER — HYDROCODONE-ACETAMINOPHEN 5-325 MG PO TABS: 1.0000 | ORAL_TABLET | Freq: Four times a day (QID) | ORAL | 0 refills | Status: DC | PRN

## 2020-02-21 NOTE — Discharge Summary (Signed)
Physician Discharge Summary  Victor Shields ZOX:[id number] DOB: 05-13-1951 DOA: 02/15/2020  PCP: Junie Spencer, FNP  Admit date: 02/15/2020 Discharge date: 02/22/2020  Admitted From: Home Disposition:  SNF  Recommendations for Outpatient Follow-up:  1. Follow up with PCP in 1-2 weeks 2. Please obtain BMP/CBC in one week    Discharge Condition: Stable CODE STATUS: FULL Diet recommendation:  Regular   Brief/Interim Summary: Victor Shields a 69 y.o.malewith medical history significant ofCOPD, tobacco abuse (ongoing) and gout arthritis; who presented to the hospital after experiencing fall at home and been unable to stand up and bear weight on his left hip. Patient denies LOC, but expressed feeling mildly lightheaded prior to falling. He normally uses a rolling walker for ambulation and expressed having previous fall approx2weeksprior to admissionafter tripping while walking.He continued to ambulate using his walker, but his pain progressed to the point where it was unbearable and he was not able to bear weight. As a result, he presented for further evaluationPatient denies fever, chills, SOB, nausea, vomiting, melena, dysuria, hematuria, hematochezia and any other complaints.  His pain was 8/10 in intensity localized in his left hip, no radiated and worsen with any movement or attempt to bear weight.  ED Course:COVID negative, no acute ischemic changes on EKG, overall stable blood work for him and with hip images demonstrating closed intertrochanteric left hip fracture. Orthopedic surgery consulted and planning surgical repair. [facility] called to admit patient for further evaluation and management. Ortho was consulted.  He was taken to surgery for ORIF on 02/17/20.   Discharge Diagnoses:  closed left hip fracture -Continue as needed analgesics -Low vitamin D level appreciated (19.72)--start repletion. -Orthopedic surgery service on board -02/17/20--s/p  ORIF -After surgical repair PT/OT-->SNF -DVT prophylaxis/pain control per ortho-->norco prn pain and ASA 325 mg for DVT prophylaxis -Per Ortho-->Staples out in 2 weeks; First postop visit in 4 weeks and Patient will be on a protected weightbearing status for 6 weeks with a walker followed by 6 weeks of monitoring until 12 weeks to ensure fracture healing  tobacco abuse -Cessation counseling provided -Nicotine patch has been offered; patient said that he will use it as needed only.  COPD -No requiring oxygen supplementation and currently no wheezing -Continue home maintenance/rescuebronchodilator regimen. -stable on RA  hyperglycemia -due to stress -A1c 4.5 -Follow CBGswith bloodworkand treat with sliding scale insulin as neededif > 200.  history ofgout -No acute flare appreciated -Continue as needed analgesics.  Upper extremity edema -venous duplex--neg  Hypomagnesemia/Hypokalemia -replete -started mag ox daily   Discharge Instructions   Allergies as of 02/21/2020      Reactions   Penicillins Swelling      Medication List    STOP taking these medications   fluconazole 150 MG tablet Commonly known as: DIFLUCAN   naproxen sodium 220 MG tablet Commonly known as: ALEVE     TAKE these medications   albuterol 108 (90 Base) MCG/ACT inhaler Commonly known as: VENTOLIN HFA TAKE 2 PUFFS BY MOUTH EVERY 6 HOURS AS NEEDED FOR WHEEZE OR SHORTNESS OF BREATH   aspirin 325 MG EC tablet Take 1 tablet (325 mg total) by mouth daily with breakfast. X 28 days   budesonide-formoterol 80-4.5 MCG/ACT inhaler Commonly known as: SYMBICORT Inhale 2 puffs into the lungs 2 (two) times daily.   celecoxib 200 MG capsule Commonly known as: CELEBREX Take 1 capsule (200 mg total) by mouth 2 (two) times daily. X 28 days   clotrimazole 1 % cream Commonly known as: LOTRIMIN  Apply 1 application topically 2 (two) times daily. What changed:   when to take this  reasons to  take this   diclofenac Sodium 1 % Gel Commonly known as: Voltaren Apply 2 g topically 4 (four) times daily.   docusate sodium 100 MG capsule Commonly known as: COLACE Take 1 capsule (100 mg total) by mouth 2 (two) times daily.   feeding supplement (ENSURE ENLIVE) Liqd Take 237 mLs by mouth 2 (two) times daily between meals.   nutrition supplement (JUVEN) Pack Take 1 packet by mouth 2 (two) times daily between meals.   gabapentin 300 MG capsule Commonly known as: NEURONTIN Take 1 capsule (300 mg total) by mouth 3 (three) times daily. X 28 days   HYDROcodone-acetaminophen 5-325 MG tablet Commonly known as: NORCO/VICODIN Take 1-2 tablets by mouth every 6 (six) hours as needed for moderate pain.   magnesium oxide 400 (241.3 Mg) MG tablet Commonly known as: MAG-OX Take 1 tablet (400 mg total) by mouth daily.   multivitamin with minerals Tabs tablet Take 1 tablet by mouth daily.   nicotine 21 mg/24hr patch Commonly known as: NICODERM CQ - dosed in mg/24 hours Place 1 patch (21 mg total) onto the skin daily.   nystatin powder Commonly known as: MYCOSTATIN/NYSTOP Apply 1 application topically 3 (three) times daily.   trolamine salicylate 10 % cream Commonly known as: ASPERCREME Apply 1 application topically as needed for muscle pain.   Vitamin D (Ergocalciferol) 1.25 MG (50000 UNIT) Caps capsule Commonly known as: DRISDOL Take 1 capsule (50,000 Units total) by mouth every 7 (seven) days. Start taking on: February 23, 2020       Contact information for after-discharge care    Destination    [facility] Preferred SNF .   Service: Skilled Nursing Contact information: [address] [place] [address] [phone]                 Allergies  Allergen Reactions  . Penicillins Swelling    Consultations:  Ortho--[facility]   Procedures/Studies: US Venous Img Upper Bilat (DVT)  Result Date:  02/19/2020 CLINICAL DATA:  Left upper extremity edema EXAM: BILATERAL UPPER EXTREMITY VENOUS DOPPLER ULTRASOUND TECHNIQUE: Gray-scale sonography with graded compression, as well as color Doppler and duplex ultrasound were performed to evaluate the bilateral upper extremity deep venous systems from the level of the subclavian vein and including the jugular, axillary, basilic, radial, ulnar and upper cephalic vein. Spectral Doppler was utilized to evaluate flow at rest and with distal augmentation maneuvers. COMPARISON:  None. FINDINGS: RIGHT UPPER EXTREMITY Internal Jugular Vein: No evidence of thrombus. Normal compressibility, respiratory phasicity and response to augmentation. Subclavian Vein: No evidence of thrombus. Normal compressibility, respiratory phasicity and response to augmentation. Axillary Vein: No evidence of thrombus. Normal compressibility, respiratory phasicity and response to augmentation. Cephalic Vein: No evidence of thrombus. Normal compressibility, respiratory phasicity and response to augmentation. Basilic Vein: No evidence of thrombus. Normal compressibility, respiratory phasicity and response to augmentation. Brachial Veins: No evidence of thrombus. Normal compressibility, respiratory phasicity and response to augmentation. Radial Veins: No evidence of thrombus. Normal compressibility, respiratory phasicity and response to augmentation. Ulnar Veins: No evidence of thrombus. Normal compressibility, respiratory phasicity and response to augmentation. Venous Reflux:  None. Other Findings:  None. LEFT UPPER EXTREMITY Internal Jugular Vein: No evidence of thrombus. Normal compressibility, respiratory phasicity and response to augmentation. Subclavian Vein: No evidence of thrombus. Normal compressibility, respiratory phasicity and response to augmentation. Axillary Vein:  No evidence of thrombus. Normal compressibility, respiratory phasicity and response to augmentation. Cephalic Vein: No evidence  of thrombus. Normal compressibility, respiratory phasicity and response to augmentation. Basilic Vein: No evidence of thrombus. Normal compressibility, respiratory phasicity and response to augmentation. Brachial Veins: No evidence of thrombus. Normal compressibility, respiratory phasicity and response to augmentation. Radial Veins: No evidence of thrombus. Normal compressibility, respiratory phasicity and response to augmentation. Ulnar Veins: No evidence of thrombus. Normal compressibility, respiratory phasicity and response to augmentation. Venous Reflux:  None. Other Findings:  Subcutaneous edema is noted. IMPRESSION: No evidence of DVT within either upper extremity. Electronically Signed   By: Alcide Clever M.D.   On: 02/19/2020 14:24   DG Chest Portable 1 View  Result Date: 02/15/2020 CLINICAL DATA:  Preop hip fracture. EXAM: PORTABLE CHEST 1 VIEW COMPARISON:  None. FINDINGS: Trachea is midline. Heart size normal. Thoracic aorta is calcified. Lungs are hyperinflated. Small right pleural effusion or pleural thickening. Lungs are otherwise grossly clear. IMPRESSION: 1. Small right pleural effusion or pleural thickening. 2. Hyperinflation. 3.  Aortic atherosclerosis (ICD10-I70.0). Electronically Signed   By: Leanna Battles M.D.   On: 02/15/2020 10:41   DG HIP OPERATIVE UNILAT W OR W/O PELVIS LEFT  Result Date: 02/17/2020 CLINICAL DATA:  Left hip fracture, intraoperative examination EXAM: OPERATIVE LEFT HIP (WITH PELVIS IF PERFORMED)  VIEWS TECHNIQUE: Fluoroscopic spot image(s) were submitted for interpretation post-operatively. COMPARISON:  02/15/2020 FINDINGS: Eleven fluoroscopic intraoperative radiographs demonstrate open reduction internal fixation of a a transcervical left femoral neck fracture with a dynamic hip screw and D rotational screw with fracture fragments in anatomic alignment on this limited examination. No unexpected fracture or dislocation. FLUOROSCOPY TIME:  4 minutes 32 seconds.  Fluoroscopic dose: 91.58 mGy IMPRESSION: Intraoperative radiographs as described above. Electronically Signed   By: Helyn Numbers MD   On: 02/17/2020 17:47   DG Hip Unilat W or Wo Pelvis 2-3 Views Left  Result Date: 02/15/2020 CLINICAL DATA:  Larey Seat 3 weeks ago and again today. Left hip pain. EXAM: DG HIP (WITH OR WITHOUT PELVIS) 2-3V LEFT COMPARISON:  None. FINDINGS: There is an impacted and possibly subacute intertrochanteric fracture of the left hip. The right hip is intact. The pubic symphysis and SI joints are intact. No pelvic fractures. Extensive vascular calcifications. IMPRESSION: Impacted acute or subacute intertrochanteric fracture of the left hip. Electronically Signed   By: Rudie Meyer M.D.   On: 02/15/2020 10:06         Discharge Exam: Vitals:   02/21/20 0712 02/21/20 1400  BP:  (!) 153/63  Pulse:  92  Resp:  18  Temp:  98 F (36.7 C)  SpO2: 97% 100%   Vitals:   02/20/20 2046 02/21/20 0412 02/21/20 0712 02/21/20 1400  BP: (!) 115/59 (!) 123/57  (!) 153/63  Pulse: 95 88  92  Resp: 16 16  18   Temp: 98.8 F (37.1 C) 98 F (36.7 C)  98 F (36.7 C)  TempSrc: Oral Oral  Oral  SpO2: 98% 97% 97% 100%  Weight:      Height:        General: Pt is alert, awake, not in acute distress Cardiovascular: RRR, S1/S2 +, no rubs, no gallops Respiratory: CTA bilaterally, no wheezing, no rhonchi Abdominal: Soft, NT, ND, bowel sounds + Extremities: no edema, no cyanosis   The results of significant diagnostics from this hospitalization (including imaging, microbiology, ancillary and laboratory) are listed below for reference.    Significant Diagnostic Studies: Venous Img Upper  Bilat (DVT)  Result Date: 02/19/2020 CLINICAL DATA:  Left upper extremity edema EXAM: BILATERAL UPPER EXTREMITY VENOUS DOPPLER ULTRASOUND TECHNIQUE: Gray-scale sonography with graded compression, as well as color Doppler and duplex ultrasound were performed to evaluate the bilateral upper extremity  deep venous systems from the level of the subclavian vein and including the jugular, axillary, basilic, radial, ulnar and upper cephalic vein. Spectral Doppler was utilized to evaluate flow at rest and with distal augmentation maneuvers. COMPARISON:  None. FINDINGS: RIGHT UPPER EXTREMITY Internal Jugular Vein: No evidence of thrombus. Normal compressibility, respiratory phasicity and response to augmentation. Subclavian Vein: No evidence of thrombus. Normal compressibility, respiratory phasicity and response to augmentation. Axillary Vein: No evidence of thrombus. Normal compressibility, respiratory phasicity and response to augmentation. Cephalic Vein: No evidence of thrombus. Normal compressibility, respiratory phasicity and response to augmentation. Basilic Vein: No evidence of thrombus. Normal compressibility, respiratory phasicity and response to augmentation. Brachial Veins: No evidence of thrombus. Normal compressibility, respiratory phasicity and response to augmentation. Radial Veins: No evidence of thrombus. Normal compressibility, respiratory phasicity and response to augmentation. Ulnar Veins: No evidence of thrombus. Normal compressibility, respiratory phasicity and response to augmentation. Venous Reflux:  None. Other Findings:  None. LEFT UPPER EXTREMITY Internal Jugular Vein: No evidence of thrombus. Normal compressibility, respiratory phasicity and response to augmentation. Subclavian Vein: No evidence of thrombus. Normal compressibility, respiratory phasicity and response to augmentation. Axillary Vein: No evidence of thrombus. Normal compressibility, respiratory phasicity and response to augmentation. Cephalic Vein: No evidence of thrombus. Normal compressibility, respiratory phasicity and response to augmentation. Basilic Vein: No evidence of thrombus. Normal compressibility, respiratory phasicity and response to augmentation. Brachial Veins: No evidence of thrombus. Normal compressibility,  respiratory phasicity and response to augmentation. Radial Veins: No evidence of thrombus. Normal compressibility, respiratory phasicity and response to augmentation. Ulnar Veins: No evidence of thrombus. Normal compressibility, respiratory phasicity and response to augmentation. Venous Reflux:  None. Other Findings:  Subcutaneous edema is noted. IMPRESSION: No evidence of DVT within either upper extremity. Electronically Signed   By: Alcide CleverMark  Lukens M.D.   On: 02/19/2020 14:24   DG Chest Portable 1 View  Result Date: 02/15/2020 CLINICAL DATA:  Preop hip fracture. EXAM: PORTABLE CHEST 1 VIEW COMPARISON:  None. FINDINGS: Trachea is midline. Heart size normal. Thoracic aorta is calcified. Lungs are hyperinflated. Small right pleural effusion or pleural thickening. Lungs are otherwise grossly clear. IMPRESSION: 1. Small right pleural effusion or pleural thickening. 2. Hyperinflation. 3.  Aortic atherosclerosis (ICD10-I70.0). Electronically Signed   By: Leanna BattlesMelinda  Blietz M.D.   On: 02/15/2020 10:41   DG HIP OPERATIVE UNILAT W OR W/O PELVIS LEFT  Result Date: 02/17/2020 CLINICAL DATA:  Left hip fracture, intraoperative examination EXAM: OPERATIVE LEFT HIP (WITH PELVIS IF PERFORMED)  VIEWS TECHNIQUE: Fluoroscopic spot image(s) were submitted for interpretation post-operatively. COMPARISON:  02/15/2020 FINDINGS: Eleven fluoroscopic intraoperative radiographs demonstrate open reduction internal fixation of a a transcervical left femoral neck fracture with a dynamic hip screw and D rotational screw with fracture fragments in anatomic alignment on this limited examination. No unexpected fracture or dislocation. FLUOROSCOPY TIME:  4 minutes 32 seconds. Fluoroscopic dose: 91.58 mGy IMPRESSION: Intraoperative radiographs as described above. Electronically Signed   By: Helyn NumbersAshesh  Parikh MD   On: 02/17/2020 17:47   DG Hip Unilat W or Wo Pelvis 2-3 Views Left  Result Date: 02/15/2020 CLINICAL DATA:  Larey SeatFell 3 weeks ago and again  today. Left hip pain. EXAM: DG HIP (WITH OR WITHOUT PELVIS) 2-3V LEFT COMPARISON:  None. FINDINGS:  There is an impacted and possibly subacute intertrochanteric fracture of the left hip. The right hip is intact. The pubic symphysis and SI joints are intact. No pelvic fractures. Extensive vascular calcifications. IMPRESSION: Impacted acute or subacute intertrochanteric fracture of the left hip. Electronically Signed   By: Rudie Meyer M.D.   On: 02/15/2020 10:06     Microbiology: Recent Results (from the past 240 hour(s))  SARS Coronavirus 2 by RT PCR (hospital order, performed in [facility] hospital lab) Nasopharyngeal Nasopharyngeal Swab     Status: None   Collection Time: 02/15/20  1:16 PM   Specimen: Nasopharyngeal Swab  Result Value Ref Range Status   SARS Coronavirus 2 NEGATIVE NEGATIVE Final    Comment: (NOTE) SARS-CoV-2 target nucleic acids are NOT DETECTED.  The SARS-CoV-2 RNA is generally detectable in upper and lower respiratory specimens during the acute phase of infection. The lowest concentration of SARS-CoV-2 viral copies this assay can detect is 250 copies / mL. A negative result does not preclude SARS-CoV-2 infection and should not be used as the sole basis for treatment or other patient management decisions.  A negative result may occur with improper specimen collection / handling, submission of specimen other than nasopharyngeal swab, presence of viral mutation(s) within the areas targeted by this assay, and inadequate number of viral copies (<250 copies / mL). A negative result must be combined with clinical observations, patient history, and epidemiological information.  Fact Sheet for Patients:   BoilerBrush.com.cy  Fact Sheet for Healthcare Providers: https://pope.com/  This test is not yet approved or  cleared by the [place] FDA and has been authorized for detection and/or diagnosis of SARS-CoV-2 by FDA  under an Emergency Use Authorization (EUA).  This EUA will remain in effect (meaning this test can be used) for the duration of the COVID-19 declaration under Section 564(b)(1) of the Act, 21 U.S.C. section 360bbb-3(b)(1), unless the authorization is terminated or revoked sooner.  Performed at [facility], [address] [place], [place] [address]   Surgical PCR screen     Status: None   Collection Time: 02/16/20 12:01 PM   Specimen: Nasal Mucosa; Nasal Swab  Result Value Ref Range Status   MRSA, PCR NEGATIVE NEGATIVE Final   Staphylococcus aureus NEGATIVE NEGATIVE Final    Comment: (NOTE) The Xpert SA Assay (FDA approved for NASAL specimens in patients 25 years of age and older), is one component of a comprehensive surveillance program. It is not intended to diagnose infection nor to guide or monitor treatment. Performed at [facility], [address] [place], [place] [address]      Labs: Basic Metabolic Panel: Recent Labs  Lab 02/15/20 1024 02/15/20 1024 02/18/20 0440 02/18/20 0440 02/19/20 0553 02/20/20 0616  NA 138  --  134*  --  133* 135  K 4.3   < > 3.7   < > 3.3* 3.8  CL 104  --  102  --  101 100  CO2 26  --  26  --  25 28  GLUCOSE 148*  --  86  --  87 104*  BUN 10  --  9  --  9 9  CREATININE 0.57*  --  0.37*  --  0.43* 0.39*  CALCIUM 8.4*  --  7.9*  --  7.9* 8.0*  MG  --   --  1.4*  --  1.7 1.7   < > = values in this interval not displayed.   Liver Function Tests: No results for input(s):  AST, ALT, ALKPHOS, BILITOT, PROT, ALBUMIN in the last 168 hours. No results for input(s): LIPASE, AMYLASE in the last 168 hours. No results for input(s): AMMONIA in the last 168 hours. CBC: Recent Labs  Lab 02/15/20 1024 02/16/20 0420 02/18/20 0440 02/19/20 0553 02/20/20 0616  WBC 6.2 7.5 7.3 8.6 8.6  NEUTROABS 4.4  --   --   --   --   HGB 12.8* 11.6* 10.1* 10.3* 10.4*  HCT 38.8* 36.1* 30.3* 31.5* 32.0*  MCV 97.5 98.6 95.3 94.9 95.0  PLT 185 203 164 200 227    Cardiac Enzymes: No results for input(s): CKTOTAL, CKMB, CKMBINDEX, TROPONINI in the last 168 hours. BNP: Invalid input(s): POCBNP CBG: Recent Labs  Lab 02/16/20 0345  GLUCAP 122*    Time coordinating discharge:  36 minutes  Signed:  Catarina Hartshorn, DO [facility] Pager: [phone] 02/21/2020, 4:49 PM

## 2020-02-22 DIAGNOSIS — R2233 Localized swelling, mass and lump, upper limb, bilateral: Secondary | ICD-10-CM | POA: Diagnosis not present

## 2020-02-22 DIAGNOSIS — D649 Anemia, unspecified: Secondary | ICD-10-CM | POA: Diagnosis not present

## 2020-02-22 DIAGNOSIS — W19XXXA Unspecified fall, initial encounter: Secondary | ICD-10-CM | POA: Diagnosis not present

## 2020-02-22 DIAGNOSIS — M1009 Idiopathic gout, multiple sites: Secondary | ICD-10-CM | POA: Diagnosis not present

## 2020-02-22 DIAGNOSIS — M150 Primary generalized (osteo)arthritis: Secondary | ICD-10-CM | POA: Diagnosis not present

## 2020-02-22 DIAGNOSIS — R2689 Other abnormalities of gait and mobility: Secondary | ICD-10-CM | POA: Diagnosis not present

## 2020-02-22 DIAGNOSIS — S72002K Fracture of unspecified part of neck of left femur, subsequent encounter for closed fracture with nonunion: Secondary | ICD-10-CM | POA: Diagnosis not present

## 2020-02-22 DIAGNOSIS — R41841 Cognitive communication deficit: Secondary | ICD-10-CM | POA: Diagnosis not present

## 2020-02-22 DIAGNOSIS — M1990 Unspecified osteoarthritis, unspecified site: Secondary | ICD-10-CM | POA: Diagnosis not present

## 2020-02-22 DIAGNOSIS — J438 Other emphysema: Secondary | ICD-10-CM | POA: Diagnosis not present

## 2020-02-22 DIAGNOSIS — S7292XA Unspecified fracture of left femur, initial encounter for closed fracture: Secondary | ICD-10-CM | POA: Diagnosis not present

## 2020-02-22 DIAGNOSIS — W19XXXD Unspecified fall, subsequent encounter: Secondary | ICD-10-CM | POA: Diagnosis not present

## 2020-02-22 DIAGNOSIS — M6281 Muscle weakness (generalized): Secondary | ICD-10-CM | POA: Diagnosis not present

## 2020-02-22 DIAGNOSIS — S72002D Fracture of unspecified part of neck of left femur, subsequent encounter for closed fracture with routine healing: Secondary | ICD-10-CM | POA: Diagnosis not present

## 2020-02-22 DIAGNOSIS — M792 Neuralgia and neuritis, unspecified: Secondary | ICD-10-CM | POA: Diagnosis not present

## 2020-02-22 DIAGNOSIS — R609 Edema, unspecified: Secondary | ICD-10-CM | POA: Diagnosis not present

## 2020-02-22 DIAGNOSIS — S72002G Fracture of unspecified part of neck of left femur, subsequent encounter for closed fracture with delayed healing: Secondary | ICD-10-CM | POA: Diagnosis not present

## 2020-02-22 DIAGNOSIS — Z9181 History of falling: Secondary | ICD-10-CM | POA: Diagnosis not present

## 2020-02-22 DIAGNOSIS — R739 Hyperglycemia, unspecified: Secondary | ICD-10-CM | POA: Diagnosis not present

## 2020-02-22 DIAGNOSIS — Z720 Tobacco use: Secondary | ICD-10-CM | POA: Diagnosis not present

## 2020-02-22 DIAGNOSIS — J449 Chronic obstructive pulmonary disease, unspecified: Secondary | ICD-10-CM | POA: Diagnosis not present

## 2020-02-22 LAB — BASIC METABOLIC PANEL
Anion gap: 7 (ref 5–15)
BUN: 7 mg/dL — ABNORMAL LOW (ref 8–23)
CO2: 27 mmol/L (ref 22–32)
Calcium: 8 mg/dL — ABNORMAL LOW (ref 8.9–10.3)
Chloride: 100 mmol/L (ref 98–111)
Creatinine, Ser: 0.43 mg/dL — ABNORMAL LOW (ref 0.61–1.24)
GFR calc Af Amer: >60 mL/min (ref >60)
GFR calc non Af Amer: >60 mL/min (ref >60)
Glucose, Bld: 112 mg/dL — ABNORMAL HIGH (ref 70–99)
Potassium: 4.2 mmol/L (ref 3.5–5.1)
Sodium: 134 mmol/L — ABNORMAL LOW (ref 135–145)

## 2020-02-22 LAB — CBC
HCT: 32.4 % — ABNORMAL LOW (ref 39.0–52.0)
Hemoglobin: 10.4 g/dL — ABNORMAL LOW (ref 13.0–17.0)
MCH: 31.3 pg (ref 26.0–34.0)
MCHC: 32.1 g/dL (ref 30.0–36.0)
MCV: 97.6 fL (ref 80.0–100.0)
Platelets: 293 10*3/uL (ref 150–400)
RBC: 3.32 MIL/uL — ABNORMAL LOW (ref 4.22–5.81)
RDW: 13.2 % (ref 11.5–15.5)
WBC: 8.6 10*3/uL (ref 4.0–10.5)
nRBC: 0 % (ref 0.0–0.2)

## 2020-02-22 LAB — SARS CORONAVIRUS 2 BY RT PCR (HOSPITAL ORDER, PERFORMED IN ~~LOC~~ HOSPITAL LAB): SARS Coronavirus 2: NEGATIVE

## 2020-02-22 LAB — MAGNESIUM: Magnesium: 1.9 mg/dL (ref 1.7–2.4)

## 2020-02-22 NOTE — TOC Transition Note (Signed)
Transition of Care [facility]) - CM/SW Discharge Note   Patient Details  Name: Victor Shields MRN: [id number] Date of Birth: Jan 22, 1951  Transition of [facility] CM/SW Contact:  Elliot Gault, LCSW Phone Number: 02/22/2020, 11:06 AM   Clinical Narrative:     Pt stable for dc per MD. Ruthe Mannan received. [facility] can take pt today pending updated negative covid test result. Spoke with pt's wife and pt to update and they remain in agreement with dc plan.  Once negative test is returned, will update RN on number to call report. EMS form printed to the floor.   There are no other TOC needs for dc.  Final next level of care: Skilled Nursing Facility Barriers to Discharge: Barriers Resolved   Patient Goals and CMS Choice Patient states their goals for this hospitalization and ongoing recovery are:: return home      Discharge Placement PASRR number recieved: 02/18/20            Patient chooses bed at: Other - please specify in the comment section below: [facility]) Patient to be transferred to facility by: EMS Name of family member notified: Avanish Cerullo Patient and family notified of of transfer: 02/22/20  Discharge Plan and Services                                     Social Determinants of Health (SDOH) Interventions     Readmission Risk Interventions No flowsheet data found.

## 2020-02-22 NOTE — [place] FL2 (Signed)
[place] MEDICAID FL2 LEVEL OF CARE SCREENING TOOL     IDENTIFICATION  Patient Name: Victor Shields Birthdate: 02-Sep-1950 Sex: male Admission Date (Current Location): 02/15/2020  [facility] and [place] Number:  [employer] and Address:  [facility],  [address] [place] [address]      Provider Number: [id number]  Attending Physician Name and Address:  Catarina Hartshorn, MD  Relative Name and Phone Number:       Current Level of Care: Hospital Recommended Level of Care: Skilled Nursing Facility Prior Approval Number:    Date Approved/Denied:   PASRR Number: [id number]  Discharge Plan: SNF    Current Diagnoses: Patient Active Problem List   Diagnosis Date Noted  . Fall   . Closed left hip fracture (HCC) 02/15/2020  . COPD (chronic obstructive pulmonary disease) (HCC) 02/15/2020  . Hyperglycemia 02/15/2020  . Chronic bronchitis (HCC) 05/28/2019  . Tobacco abuse 05/28/2019  . Bilateral wheezing 03/20/2018  . Closed fracture of surgical neck of humerus 03/11/2018    Orientation RESPIRATION BLADDER Height & Weight     Self, Time, Situation, Place  Normal Continent Weight: 135 lb 9.3 oz (61.5 kg) Height:  5\' 8"  (172.7 cm)  BEHAVIORAL SYMPTOMS/MOOD NEUROLOGICAL BOWEL NUTRITION STATUS      Continent Diet (see dc summary)  AMBULATORY STATUS COMMUNICATION OF NEEDS Skin   Extensive Assist Verbally Surgical wounds                       Personal Care Assistance Level of Assistance  Bathing, Feeding, Dressing Bathing Assistance: Limited assistance Feeding assistance: Independent Dressing Assistance: Limited assistance     Functional Limitations Info  Sight, Hearing, Speech Sight Info: Adequate Hearing Info: Adequate Speech Info: Adequate    SPECIAL CARE FACTORS FREQUENCY  PT (By licensed PT), OT (By licensed OT)     PT Frequency: 5x week OT Frequency: 3x week            Contractures Contractures Info: Not present     Additional Factors Info  Code Status, Allergies Code Status Info: Full Allergies Info: Penicillins           Current Medications (02/22/2020):  This is the current hospital active medication list Current Facility-Administered Medications  Medication Dose Route Frequency Provider Last Rate Last Admin  . 0.9 %  sodium chloride infusion   Intravenous Continuous 02/24/2020, MD 100 mL/hr at 02/17/20 1810 New Bag at 02/17/20 1810  . aspirin EC tablet 325 mg  325 mg Oral Q breakfast 04/18/20, MD   325 mg at 02/22/20 02/24/20  . bisacodyl (DULCOLAX) suppository 10 mg  10 mg Rectal Once Tat, David, MD      . celecoxib (CELEBREX) capsule 200 mg  200 mg Oral BID 8841, MD   200 mg at 02/22/20 02/24/20  . Chlorhexidine Gluconate Cloth 2 % PADS 6 each  6 each Topical Daily Tat, David, MD   6 each at 02/21/20 0840  . diclofenac Sodium (VOLTAREN) 1 % topical gel 2 g  2 g Topical QID 02/23/20, MD   2 g at 02/22/20 02/24/20  . docusate sodium (COLACE) capsule 100 mg  100 mg Oral BID 3016, MD   100 mg at 02/20/20 1051  . docusate sodium (COLACE) capsule 100 mg  100 mg Oral BID 02/22/20, MD   100 mg at 02/22/20 02/24/20  . feeding supplement (ENSURE ENLIVE) (ENSURE ENLIVE) liquid  237 mL  237 mL Oral BID BM Vickki Hearing, MD   237 mL at 02/22/20 0920  . gabapentin (NEURONTIN) capsule 300 mg  300 mg Oral TID Vickki Hearing, MD   300 mg at 02/22/20 0919  . HYDROcodone-acetaminophen (NORCO/VICODIN) 5-325 MG per tablet 1-2 tablet  1-2 tablet Oral Q6H PRN Vickki Hearing, MD   1 tablet at 02/21/20 1854  . ipratropium-albuterol (DUONEB) 0.5-2.5 (3) MG/3ML nebulizer solution 3 mL  3 mL Nebulization Q6H PRN Vickki Hearing, MD      . magnesium oxide (MAG-OX) tablet 400 mg  400 mg Oral Daily Tat, David, MD   400 mg at 02/22/20 0919  . menthol-cetylpyridinium (CEPACOL) lozenge 3 mg  1 lozenge Oral PRN Vickki Hearing, MD       Or  . phenol  (CHLORASEPTIC) mouth spray 1 spray  1 spray Mouth/Throat PRN Vickki Hearing, MD      . methocarbamol (ROBAXIN) tablet 500 mg  500 mg Oral Q6H PRN Vickki Hearing, MD   500 mg at 02/20/20 2155   Or  . methocarbamol (ROBAXIN) 500 mg in dextrose 5 % 50 mL IVPB  500 mg Intravenous Q6H PRN Vickki Hearing, MD      . metoCLOPramide (REGLAN) tablet 5-10 mg  5-10 mg Oral Q8H PRN Vickki Hearing, MD       Or  . metoCLOPramide (REGLAN) injection 5-10 mg  5-10 mg Intravenous Q8H PRN Vickki Hearing, MD   10 mg at 02/18/20 1222  . mometasone-formoterol (DULERA) 100-5 MCG/ACT inhaler 2 puff  2 puff Inhalation BID Vickki Hearing, MD   2 puff at 02/22/20 0756  . morphine 2 MG/ML injection 2 mg  2 mg Intravenous Q3H PRN Vickki Hearing, MD   2 mg at 02/21/20 2159  . multivitamin with minerals tablet 1 tablet  1 tablet Oral Daily Vickki Hearing, MD   1 tablet at 02/22/20 0919  . nicotine (NICODERM CQ - dosed in mg/24 hours) patch 21 mg  21 mg Transdermal Daily Vickki Hearing, MD   21 mg at 02/21/20 0839  . nutrition supplement (JUVEN) (JUVEN) powder packet 1 packet  1 packet Oral BID BM Vickki Hearing, MD   1 packet at 02/20/20 1048  . ondansetron (ZOFRAN) tablet 4 mg  4 mg Oral Q6H PRN Vickki Hearing, MD       Or  . ondansetron [facility]) injection 4 mg  4 mg Intravenous Q6H PRN Vickki Hearing, MD      . polyethylene glycol (MIRALAX / GLYCOLAX) packet 17 g  17 g Oral Daily PRN Vickki Hearing, MD      . traMADol Janean Sark) tablet 50 mg  50 mg Oral Q6H Vickki Hearing, MD   50 mg at 02/22/20 0647  . Vitamin D (Ergocalciferol) (DRISDOL) capsule 50,000 Units  50,000 Units Oral Q7 days Vickki Hearing, MD   50,000 Units at 02/16/20 1703     Discharge Medications: Please see discharge summary for a list of discharge medications.  Relevant Imaging Results:  Relevant Lab Results:   Additional Information SSN: [id number]  [place]

## 2020-02-22 NOTE — Progress Notes (Signed)
PROGRESS NOTE  Victor Shields HYW:[id number] DOB: August 06, 1950 DOA: 02/15/2020 PCP: Junie Spencer, FNP  Brief History:  69 y.o.malewith medical history significant ofCOPD, tobacco abuse (ongoing) and gout arthritis; who presented to the hospital after experiencing fall at home and been unable to stand up and bear weight on his left hip. Patient denies LOC, but expressed feeling mildly lightheaded prior to falling. He normally uses a rolling walker for ambulation and expressed having previous fall approx2weeksprior to admissionafter tripping while walking.He continued to ambulate using his walker, but his pain progressed to the point where it was unbearable and he was not able to bear weight. As a result, he presented for further evaluationPatient denies fever, chills, SOB, nausea, vomiting, melena, dysuria, hematuria, hematochezia and any other complaints.  His pain was 8/10 in intensity localized in his left hip, no radiated and worsen with any movement or attempt to bear weight.  ED Course:COVID negative, no acute ischemic changes on EKG, overall stable blood work for him and with hip images demonstrating closed intertrochanteric left hip fracture. Orthopedic surgery consulted and planning surgical repair. [facility] called to admit patient for further evaluation and management. Ortho was consulted.  He was taken to surgery for ORIF on 02/17/20.  Assessment/Plan:  closed left hip fracture -Continue as needed analgesics -Low vitamin D level appreciated (19.72)--start repletion. -Orthopedic surgery service on board -02/17/20--s/p ORIF -After surgical repair PT/OT-->SNF -DVT prophylaxis/pain control per ortho-->norco prn pain and ASA 325 mg for DVT prophylaxis -Per Ortho-->Staples out in 2 weeks; First postop visit in 4 weeks and Patient will be on a protected weightbearing status for 6 weeks with a walker followed by 6 weeks of monitoring until 12 weeks to ensure fracture  healing  tobacco abuse -Cessation counseling provided -Nicotine patch has been offered; patient said that he will use it as needed only.  COPD -No requiring oxygen supplementation and currently no wheezing -Continue home maintenance/rescuebronchodilator regimen. -stable on RA  hyperglycemia -due to stress -A1c 4.5 -Follow CBGswith bloodworkand treat with sliding scale insulin as neededif > 200.  history ofgout -No acute flare appreciated -Continue as needed analgesics.  Upper extremity edema -venous duplex--neg  Hypomagnesemia/Hypokalemia -replete -startedmag ox daily      Status is: Inpatient  Remains inpatient appropriate because:IV treatments appropriate due to intensity of illness or inability to take PO   Dispo: The patient is from: Home              Anticipated d/c is to: SNF              Anticipated d/c date is: 02/22/2020              Patient currently is medically stable to d/c.        Family Communication:   spouse at bedside 8/16  Consultants:  ortho  Code Status:  FULL      Procedures: As Listed in Progress Note Above  Antibiotics: None       Subjective: Patient denies fevers, chills, headache, chest pain, dyspnea, nausea, vomiting, diarrhea, abdominal pain, dysuria, hematuria, hematochezia, and melena. He complains of pain in left leg with weight bearing  Objective: Vitals:   02/21/20 2111 02/21/20 2151 02/22/20 0535 02/22/20 0758  BP:  (!) 113/46 (!) 130/56   Pulse: 91 89 94   Resp: 20 18 16    Temp:  (!) 97.5 F (36.4 C) 97.8 F (36.6 C)   TempSrc:  Oral  SpO2: 100% 98% 100% 97%  Weight:      Height:        Intake/Output Summary (Last 24 hours) at 02/22/2020 1037 Last data filed at 02/22/2020 0541 Gross per 24 hour  Intake 720 ml  Output 700 ml  Net 20 ml   Weight change:  Exam:   General:  Pt is alert, follows commands appropriately, not in acute distress  HEENT: No icterus, No thrush, No  neck mass, [place]/AT  Cardiovascular: RRR, S1/S2, no rubs, no gallops  Respiratory: bibasilar crackles. No wheeze  Abdomen: Soft/+BS, non tender, non distended, no guarding  Extremities: No edema, No lymphangitis, No petechiae, No rashes, no synovitis   Data Reviewed: I have personally reviewed following labs and imaging studies Basic Metabolic Panel: Recent Labs  Lab 02/18/20 0440 02/19/20 0553 02/20/20 0616 02/22/20 0559  NA 134* 133* 135 134*  K 3.7 3.3* 3.8 4.2  CL 102 101 100 100  CO2 26 25 28 27   GLUCOSE 86 87 104* 112*  BUN 9 9 9  7*  CREATININE 0.37* 0.43* 0.39* 0.43*  CALCIUM 7.9* 7.9* 8.0* 8.0*  MG 1.4* 1.7 1.7 1.9   Liver Function Tests: No results for input(s): AST, ALT, ALKPHOS, BILITOT, PROT, ALBUMIN in the last 168 hours. No results for input(s): LIPASE, AMYLASE in the last 168 hours. No results for input(s): AMMONIA in the last 168 hours. Coagulation Profile: No results for input(s): INR, PROTIME in the last 168 hours. CBC: Recent Labs  Lab 02/16/20 0420 02/18/20 0440 02/19/20 0553 02/20/20 0616 02/22/20 0559  WBC 7.5 7.3 8.6 8.6 8.6  HGB 11.6* 10.1* 10.3* 10.4* 10.4*  HCT 36.1* 30.3* 31.5* 32.0* 32.4*  MCV 98.6 95.3 94.9 95.0 97.6  PLT 203 164 200 227 293   Cardiac Enzymes: No results for input(s): CKTOTAL, CKMB, CKMBINDEX, TROPONINI in the last 168 hours. BNP: Invalid input(s): POCBNP CBG: Recent Labs  Lab 02/16/20 0345  GLUCAP 122*   HbA1C: No results for input(s): HGBA1C in the last 72 hours. Urine analysis: No results found for: COLORURINE, APPEARANCEUR, LABSPEC, PHURINE, GLUCOSEU, HGBUR, BILIRUBINUR, KETONESUR, PROTEINUR, UROBILINOGEN, NITRITE, LEUKOCYTESUR Sepsis Labs: @LABRCNTIP (procalcitonin:4,lacticidven:4) ) Recent Results (from the past 240 hour(s))  SARS Coronavirus 2 by RT PCR (hospital order, performed in [facility] lab) Nasopharyngeal Nasopharyngeal Swab     Status: None   Collection Time: 02/15/20  1:16 PM    Specimen: Nasopharyngeal Swab  Result Value Ref Range Status   SARS Coronavirus 2 NEGATIVE NEGATIVE Final    Comment: (NOTE) SARS-CoV-2 target nucleic acids are NOT DETECTED.  The SARS-CoV-2 RNA is generally detectable in upper and lower respiratory specimens during the acute phase of infection. The lowest concentration of SARS-CoV-2 viral copies this assay Shields detect is 250 copies / mL. A negative result does not preclude SARS-CoV-2 infection and should not be used as the sole basis for treatment or other patient management decisions.  A negative result may occur with improper specimen collection / handling, submission of specimen other than nasopharyngeal swab, presence of viral mutation(s) within the areas targeted by this assay, and inadequate number of viral copies (<250 copies / mL). A negative result must be combined with clinical observations, patient history, and epidemiological information.  Fact Sheet for Patients:   BoilerBrush.com.cyhttps://www.fda.gov/media/136312/download  Fact Sheet for Healthcare Providers: https://pope.com/https://www.fda.gov/media/136313/download  This test is not yet approved or  cleared by the [place] FDA and has been authorized for detection and/or diagnosis of SARS-CoV-2 by FDA under an Emergency Use Authorization (EUA).  This EUA will remain in effect (meaning this test Shields be used) for the duration of the COVID-19 declaration under Section 564(b)(1) of the Act, 21 U.S.C. section 360bbb-3(b)(1), unless the authorization is terminated or revoked sooner.  Performed at [facility], [address] [place], [place] [address]   Surgical PCR screen     Status: None   Collection Time: 02/16/20 12:01 PM   Specimen: Nasal Mucosa; Nasal Swab  Result Value Ref Range Status   MRSA, PCR NEGATIVE NEGATIVE Final   Staphylococcus aureus NEGATIVE NEGATIVE Final    Comment: (NOTE) The Xpert SA Assay (FDA approved for NASAL specimens in patients 76 years of age and older), is one  component of a comprehensive surveillance program. It is not intended to diagnose infection nor to guide or monitor treatment. Performed at [facility], [address], [place], [place] [address]      Scheduled Meds: . aspirin EC  325 mg Oral Q breakfast  . bisacodyl  10 mg Rectal Once  . celecoxib  200 mg Oral BID  . Chlorhexidine Gluconate Cloth  6 each Topical Daily  . diclofenac Sodium  2 g Topical QID  . docusate sodium  100 mg Oral BID  . docusate sodium  100 mg Oral BID  . feeding supplement (ENSURE ENLIVE)  237 mL Oral BID BM  . gabapentin  300 mg Oral TID  . magnesium oxide  400 mg Oral Daily  . mometasone-formoterol  2 puff Inhalation BID  . multivitamin with minerals  1 tablet Oral Daily  . nicotine  21 mg Transdermal Daily  . nutrition supplement (JUVEN)  1 packet Oral BID BM  . traMADol  50 mg Oral Q6H  . Vitamin D (Ergocalciferol)  50,000 Units Oral Q7 days   Continuous Infusions: . sodium chloride 100 mL/hr at 02/17/20 1810  . methocarbamol (ROBAXIN) IV      Procedures/Studies: US Venous Img Upper Bilat (DVT)  Result Date: 02/19/2020 CLINICAL DATA:  Left upper extremity edema EXAM: BILATERAL UPPER EXTREMITY VENOUS DOPPLER ULTRASOUND TECHNIQUE: Gray-scale sonography with graded compression, as well as color Doppler and duplex ultrasound were performed to evaluate the bilateral upper extremity deep venous systems from the level of the subclavian vein and including the jugular, axillary, basilic, radial, ulnar and upper cephalic vein. Spectral Doppler was utilized to evaluate flow at rest and with distal augmentation maneuvers. COMPARISON:  None. FINDINGS: RIGHT UPPER EXTREMITY Internal Jugular Vein: No evidence of thrombus. Normal compressibility, respiratory phasicity and response to augmentation. Subclavian Vein: No evidence of thrombus. Normal compressibility, respiratory phasicity and response to augmentation. Axillary Vein: No evidence of thrombus. Normal  compressibility, respiratory phasicity and response to augmentation. Cephalic Vein: No evidence of thrombus. Normal compressibility, respiratory phasicity and response to augmentation. Basilic Vein: No evidence of thrombus. Normal compressibility, respiratory phasicity and response to augmentation. Brachial Veins: No evidence of thrombus. Normal compressibility, respiratory phasicity and response to augmentation. Radial Veins: No evidence of thrombus. Normal compressibility, respiratory phasicity and response to augmentation. Ulnar Veins: No evidence of thrombus. Normal compressibility, respiratory phasicity and response to augmentation. Venous Reflux:  None. Other Findings:  None. LEFT UPPER EXTREMITY Internal Jugular Vein: No evidence of thrombus. Normal compressibility, respiratory phasicity and response to augmentation. Subclavian Vein: No evidence of thrombus. Normal compressibility, respiratory phasicity and response to augmentation. Axillary Vein: No evidence of thrombus. Normal compressibility, respiratory phasicity and response to augmentation. Cephalic Vein: No evidence of thrombus. Normal compressibility, respiratory phasicity and response to augmentation. Basilic Vein: No  evidence of thrombus. Normal compressibility, respiratory phasicity and response to augmentation. Brachial Veins: No evidence of thrombus. Normal compressibility, respiratory phasicity and response to augmentation. Radial Veins: No evidence of thrombus. Normal compressibility, respiratory phasicity and response to augmentation. Ulnar Veins: No evidence of thrombus. Normal compressibility, respiratory phasicity and response to augmentation. Venous Reflux:  None. Other Findings:  Subcutaneous edema is noted. IMPRESSION: No evidence of DVT within either upper extremity. Electronically Signed   By: Alcide Clever M.D.   On: 02/19/2020 14:24   DG Chest Portable 1 View  Result Date: 02/15/2020 CLINICAL DATA:  Preop hip fracture. EXAM:  PORTABLE CHEST 1 VIEW COMPARISON:  None. FINDINGS: Trachea is midline. Heart size normal. Thoracic aorta is calcified. Lungs are hyperinflated. Small right pleural effusion or pleural thickening. Lungs are otherwise grossly clear. IMPRESSION: 1. Small right pleural effusion or pleural thickening. 2. Hyperinflation. 3.  Aortic atherosclerosis (ICD10-I70.0). Electronically Signed   By: Leanna Battles M.D.   On: 02/15/2020 10:41   DG HIP OPERATIVE UNILAT W OR W/O PELVIS LEFT  Result Date: 02/17/2020 CLINICAL DATA:  Left hip fracture, intraoperative examination EXAM: OPERATIVE LEFT HIP (WITH PELVIS IF PERFORMED)  VIEWS TECHNIQUE: Fluoroscopic spot image(s) were submitted for interpretation post-operatively. COMPARISON:  02/15/2020 FINDINGS: Eleven fluoroscopic intraoperative radiographs demonstrate open reduction internal fixation of a a transcervical left femoral neck fracture with a dynamic hip screw and D rotational screw with fracture fragments in anatomic alignment on this limited examination. No unexpected fracture or dislocation. FLUOROSCOPY TIME:  4 minutes 32 seconds. Fluoroscopic dose: 91.58 mGy IMPRESSION: Intraoperative radiographs as described above. Electronically Signed   By: Helyn Numbers MD   On: 02/17/2020 17:47   DG Hip Unilat W or Wo Pelvis 2-3 Views Left  Result Date: 02/15/2020 CLINICAL DATA:  Larey Seat 3 weeks ago and again today. Left hip pain. EXAM: DG HIP (WITH OR WITHOUT PELVIS) 2-3V LEFT COMPARISON:  None. FINDINGS: There is an impacted and possibly subacute intertrochanteric fracture of the left hip. The right hip is intact. The pubic symphysis and SI joints are intact. No pelvic fractures. Extensive vascular calcifications. IMPRESSION: Impacted acute or subacute intertrochanteric fracture of the left hip. Electronically Signed   By: Rudie Meyer M.D.   On: 02/15/2020 10:06    Catarina Hartshorn, DO  [facility]  If 7PM-7AM, please contact night-coverage [email or web address] Password  [facility] 02/22/2020, 10:37 AM   LOS: 7 days

## 2020-02-23 DIAGNOSIS — M150 Primary generalized (osteo)arthritis: Secondary | ICD-10-CM | POA: Diagnosis not present

## 2020-02-23 DIAGNOSIS — S7292XA Unspecified fracture of left femur, initial encounter for closed fracture: Secondary | ICD-10-CM | POA: Diagnosis not present

## 2020-02-23 DIAGNOSIS — M1009 Idiopathic gout, multiple sites: Secondary | ICD-10-CM | POA: Diagnosis not present

## 2020-02-23 DIAGNOSIS — J449 Chronic obstructive pulmonary disease, unspecified: Secondary | ICD-10-CM | POA: Diagnosis not present

## 2020-03-15 ENCOUNTER — Telehealth: Payer: Self-pay | Admitting: *Deleted

## 2020-03-15 ENCOUNTER — Telehealth: Payer: Self-pay | Admitting: Family

## 2020-03-15 ENCOUNTER — Other Ambulatory Visit: Payer: Self-pay

## 2020-03-15 NOTE — Telephone Encounter (Signed)
TRANSITIONAL CARE MANAGEMENT TELEPHONE OUTREACH NOTE   Contact Date: 03/15/2020 Contacted By: Adella Hare   DISCHARGE INFORMATION Date of Discharge: 03/12/20 Discharge Facility: [facility] rehab Principal Discharge Diagnosis: Left hip fracture  Outpatient Follow Up Recommendations (copied from discharge summary) D/C summary unavailable.   Victor Shields is a male primary care patient of Victor Spencer, FNP. An outgoing telephone call was made today and I spoke with Victor Shields.  Victor Shields condition(s) and treatment(s) were discussed. An opportunity to ask questions was provided and all were answered or forwarded as appropriate.    ACTIVITIES OF DAILY LIVING  Victor Shields lives with his spouse and he cannot perform ADLs independently. His primary caregiver is his wife. He is able to depend on his primary caregiver(s) for consistent help. Transportation to appointments, to pick up medications, and to run errands is not a problem.  (Consider referral to [facility] if transportation or a consistent caregiver is a problem)   Fall Risk Fall Risk  11/24/2019 11/13/2018  Falls in the past year? 0 0  Number falls in past yr: - -  Injury with Fall? - -    High Fall Risk- fall resulted in hip fracture  Home Modifications/Assistive Devices Wheelchair: No Cane: Yes Ramp: No Bedside Toilet: Yes Hospital Bed:  No Other: [facility] He is receiving home health Nursing and Physical therapy services.   MEDICATION RECONCILIATION  Mr. Victor Shields has been able to pick-up all prescribed discharge medications from the pharmacy.   A post discharge medication reconciliation was performed and the complete medication list was reviewed with the patient/caregiver and is current as of 03/15/2020. Changes highlighted below.  Discontinued Medications None per wife.   Current Medication List Allergies as of 03/15/2020      Reactions   Penicillins Swelling      Medication List         Accurate as of March 15, 2020  3:30 PM. If you have any questions, ask your nurse or doctor.        albuterol 108 (90 Base) MCG/ACT inhaler Commonly known as: VENTOLIN HFA TAKE 2 PUFFS BY MOUTH EVERY 6 HOURS AS NEEDED FOR WHEEZE OR SHORTNESS OF BREATH   aspirin 325 MG EC tablet Take 1 tablet (325 mg total) by mouth daily with breakfast. X 28 days   budesonide-formoterol 80-4.5 MCG/ACT inhaler Commonly known as: SYMBICORT Inhale 2 puffs into the lungs 2 (two) times daily.   celecoxib 200 MG capsule Commonly known as: CELEBREX Take 1 capsule (200 mg total) by mouth 2 (two) times daily. X 28 days   clotrimazole 1 % cream Commonly known as: LOTRIMIN Apply 1 application topically 2 (two) times daily. What changed:   when to take this  reasons to take this   diclofenac Sodium 1 % Gel Commonly known as: Voltaren Apply 2 g topically 4 (four) times daily.   docusate sodium 100 MG capsule Commonly known as: COLACE Take 1 capsule (100 mg total) by mouth 2 (two) times daily.   feeding supplement (ENSURE ENLIVE) Liqd Take 237 mLs by mouth 2 (two) times daily between meals.   nutrition supplement (JUVEN) Pack Take 1 packet by mouth 2 (two) times daily between meals.   gabapentin 300 MG capsule Commonly known as: NEURONTIN Take 1 capsule (300 mg total) by mouth 3 (three) times daily. X 28 days   HYDROcodone-acetaminophen 5-325 MG tablet Commonly known as: NORCO/VICODIN Take 1-2 tablets by mouth every 6 (  six) hours as needed for moderate pain.   magnesium oxide 400 (241.3 Mg) MG tablet Commonly known as: MAG-OX Take 1 tablet (400 mg total) by mouth daily.   multivitamin with minerals Tabs tablet Take 1 tablet by mouth daily.   nicotine 21 mg/24hr patch Commonly known as: NICODERM CQ - dosed in mg/24 hours Place 1 patch (21 mg total) onto the skin daily.   nystatin powder Commonly known as: MYCOSTATIN/NYSTOP Apply 1 application topically 3 (three) times  daily.   trolamine salicylate 10 % cream Commonly known as: ASPERCREME Apply 1 application topically as needed for muscle pain.   Vitamin D (Ergocalciferol) 1.25 MG (50000 UNIT) Caps capsule Commonly known as: DRISDOL Take 1 capsule (50,000 Units total) by mouth every 7 (seven) days.        PATIENT EDUCATION & FOLLOW-UP PLAN  An appointment for Transitional Care Management is scheduled with Victor Spencer, FNP on 03/17/20 at 4:10 pm. Televisit.   Take all medications as prescribed  Contact our office by calling [phone] if you have any questions or concerns

## 2020-03-15 NOTE — Telephone Encounter (Signed)
Appt changed to televisit

## 2020-03-15 NOTE — Telephone Encounter (Signed)
03/15/2020  Patient discharged from [facility] on 03/12/20. Please reach out to complete TOC call and to schedule a visit with provider. You should be within the "2 business" day window until 03/16/20 because of office closure on Monday.    Forwarding to [facility] Clinical staff for review and action.

## 2020-03-15 NOTE — Patient Outreach (Signed)
[facility]) Care Management  03/15/2020  Victor Shields 21-Oct-1950 [id number]   Referral Date: 03/15/20 Referral Source: [employer] Report Date of Discharge:  03/12/20 Facility:  [facility] Insurance: [facility]   Referral received.  No outreach warranted at this time.  Transition of Care calls being completed via EMMI. RN CM will outreach patient for any red flags received.    Plan: RN CM will close case.    [name], RN, MSN [facility] Care Management Care Management Coordinator Direct Line [phone] Toll Free: [phone]  Fax: [phone]

## 2020-03-15 NOTE — Telephone Encounter (Signed)
TOC called completed.

## 2020-03-17 ENCOUNTER — Ambulatory Visit (INDEPENDENT_AMBULATORY_CARE_PROVIDER_SITE_OTHER): Payer: Medicare HMO | Admitting: Family

## 2020-03-17 ENCOUNTER — Encounter: Payer: Self-pay | Admitting: Family

## 2020-03-17 DIAGNOSIS — Z720 Tobacco use: Secondary | ICD-10-CM

## 2020-03-17 DIAGNOSIS — S72002G Fracture of unspecified part of neck of left femur, subsequent encounter for closed fracture with delayed healing: Secondary | ICD-10-CM

## 2020-03-17 DIAGNOSIS — W19XXXD Unspecified fall, subsequent encounter: Secondary | ICD-10-CM

## 2020-03-17 DIAGNOSIS — Z09 Encounter for follow-up examination after completed treatment for conditions other than malignant neoplasm: Secondary | ICD-10-CM | POA: Diagnosis not present

## 2020-03-17 MED ORDER — HYDROCODONE-ACETAMINOPHEN 5-325 MG PO TABS: 1.0000 | ORAL_TABLET | Freq: Four times a day (QID) | ORAL | 0 refills | Status: DC | PRN

## 2020-03-17 NOTE — Progress Notes (Signed)
Virtual Visit via telephone Note Due to COVID-19 pandemic this visit was conducted virtually. This visit type was conducted due to national recommendations for restrictions regarding the COVID-19 Pandemic (e.g. social distancing, sheltering in place) in an effort to limit this patient's exposure and mitigate transmission in our community. All issues noted in this document were discussed and addressed.  A physical exam was not performed with this format.  I connected with Victor Shields on 03/17/20 at 12:53 pm by telephone  And video and verified that I am speaking with the correct person using two identifiers. Victor Shields is currently located at home and wife is currently with him during visit. The provider, Evelina Dun, FNP is located in their office at time of visit.  I discussed the limitations, risks, security and privacy concerns of performing an evaluation and management service by telephone and the availability of in person appointments. I also discussed with the patient that there may be a patient responsible charge related to this service. The patient expressed understanding and agreed to proceed.   History and Present Illness:  HPI Pt calls the office today for hospital follow up. He went to the ED on 02/15/20 with left hip and leg pain. He has fallen a week prior and found to have a fractured left hip in the ED. He had a ORIF on 02/17/20. He was discharged 02/22/20 to a SNF for PT and OT. He was discharged [facility] on 03/12/20.   He reports his pain is 10 out 10, but comes and goes.   He is taking aspirin 325 mg daily for DVT prophylaxis. He has not had any pain medication since discharge from SNF.   He has a follow up with Ortho 03/24/20. He is suppose to have home PT and OT, but has not come out yet.   Using rolling walker.    Review of Systems  All other systems reviewed and are negative.    Observations/Objective: No SOB or distress noted   Assessment and  Plan: Victor Shields comes in today with chief complaint of No chief complaint on file.   Diagnosis and orders addressed:  1. Closed fracture of left hip with delayed healing, subsequent encounter - HYDROcodone-acetaminophen (NORCO/VICODIN) 5-325 MG tablet; Take 1-2 tablets by mouth every 6 (six) hours as needed for moderate pain.  Dispense: 20 tablet; Refill: 0 - CMP14+EGFR - CBC with Differential/Platelet  2. Fall, subsequent encounter - HYDROcodone-acetaminophen (NORCO/VICODIN) 5-325 MG tablet; Take 1-2 tablets by mouth every 6 (six) hours as needed for moderate pain.  Dispense: 20 tablet; Refill: 0 - CMP14+EGFR - CBC with Differential/Platelet  3. Tobacco abuse - CMP14+EGFR - CBC with Differential/Platelet  4. Hospital discharge follow-up - HYDROcodone-acetaminophen (NORCO/VICODIN) 5-325 MG tablet; Take 1-2 tablets by mouth every 6 (six) hours as needed for moderate pain.  Dispense: 20 tablet; Refill: 0 - CMP14+EGFR - CBC with Differential/Platelet   Labs pending Will give Norco as needed Keep Ortho appt and PT and OT Health Maintenance reviewed Diet and exercise encouraged    I discussed the assessment and treatment plan with the patient. The patient was provided an opportunity to ask questions and all were answered. The patient agreed with the plan and demonstrated an understanding of the instructions.   The patient was advised to call back or seek an in-person evaluation if the symptoms worsen or if the condition fails to improve as anticipated.  The above assessment and management plan was discussed with the patient. The  patient verbalized understanding of and has agreed to the management plan. Patient is aware to call the clinic if symptoms persist or worsen. Patient is aware when to return to the clinic for a follow-up visit. Patient educated on when it is appropriate to go to the emergency department.   Time call ended:  1:15 pm  I provided 22 minutes of  non-face-to-face time during this encounter.    Evelina Dun, FNP

## 2020-03-20 DIAGNOSIS — G629 Polyneuropathy, unspecified: Secondary | ICD-10-CM | POA: Diagnosis not present

## 2020-03-20 DIAGNOSIS — Z9181 History of falling: Secondary | ICD-10-CM | POA: Diagnosis not present

## 2020-03-20 DIAGNOSIS — S72002D Fracture of unspecified part of neck of left femur, subsequent encounter for closed fracture with routine healing: Secondary | ICD-10-CM | POA: Diagnosis not present

## 2020-03-20 DIAGNOSIS — L89623 Pressure ulcer of left heel, stage 3: Secondary | ICD-10-CM | POA: Diagnosis not present

## 2020-03-20 DIAGNOSIS — M109 Gout, unspecified: Secondary | ICD-10-CM | POA: Diagnosis not present

## 2020-03-20 DIAGNOSIS — J449 Chronic obstructive pulmonary disease, unspecified: Secondary | ICD-10-CM | POA: Diagnosis not present

## 2020-03-20 DIAGNOSIS — M1991 Primary osteoarthritis, unspecified site: Secondary | ICD-10-CM | POA: Diagnosis not present

## 2020-03-20 DIAGNOSIS — F17210 Nicotine dependence, cigarettes, uncomplicated: Secondary | ICD-10-CM | POA: Diagnosis not present

## 2020-03-21 ENCOUNTER — Telehealth: Payer: Self-pay | Admitting: Family

## 2020-03-21 ENCOUNTER — Other Ambulatory Visit: Payer: Self-pay

## 2020-03-21 DIAGNOSIS — J42 Unspecified chronic bronchitis: Secondary | ICD-10-CM

## 2020-03-21 MED ORDER — BUDESONIDE-FORMOTEROL FUMARATE 160-4.5 MCG/ACT IN AERO
2.0000 | INHALATION_SPRAY | Freq: Two times a day (BID) | RESPIRATORY_TRACT | 3 refills | Status: DC
Start: 2020-03-21 — End: 2020-07-15

## 2020-03-21 NOTE — Telephone Encounter (Signed)
Patient aware.

## 2020-03-21 NOTE — Telephone Encounter (Signed)
Sent Symbicort at a higher dose for the patient.

## 2020-03-21 NOTE — Patient Outreach (Signed)
[facility]) Care Management  03/21/2020  Victor Shields 25-Nov-1950 [id number]   EMMI- General Discharge RED ON EMMI ALERT Day # 4 Date:  03/19/20 Red Alert Reason: Other questions/problems? yes  Outreach attempt: spoke with patient. He is doing good since being home.  He and wife state they were wondering about home health but the nurse finally came by on yesterday for initial visit.  Patient saw PCP on Thursday for follow up. Discussed [facility] services further follow up. Services declined at this time.    Plan: RN CM will close case.   [name], RN, MSN [facility] Care Management Care Management Coordinator Direct Line [phone] Toll Free: [phone]  Fax: [phone]

## 2020-03-23 DIAGNOSIS — L89623 Pressure ulcer of left heel, stage 3: Secondary | ICD-10-CM | POA: Diagnosis not present

## 2020-03-23 DIAGNOSIS — S72002D Fracture of unspecified part of neck of left femur, subsequent encounter for closed fracture with routine healing: Secondary | ICD-10-CM | POA: Diagnosis not present

## 2020-03-23 DIAGNOSIS — M109 Gout, unspecified: Secondary | ICD-10-CM | POA: Diagnosis not present

## 2020-03-23 DIAGNOSIS — Z9181 History of falling: Secondary | ICD-10-CM | POA: Diagnosis not present

## 2020-03-23 DIAGNOSIS — M1991 Primary osteoarthritis, unspecified site: Secondary | ICD-10-CM | POA: Diagnosis not present

## 2020-03-23 DIAGNOSIS — F17210 Nicotine dependence, cigarettes, uncomplicated: Secondary | ICD-10-CM | POA: Diagnosis not present

## 2020-03-23 DIAGNOSIS — G629 Polyneuropathy, unspecified: Secondary | ICD-10-CM | POA: Diagnosis not present

## 2020-03-23 DIAGNOSIS — J449 Chronic obstructive pulmonary disease, unspecified: Secondary | ICD-10-CM | POA: Diagnosis not present

## 2020-03-24 ENCOUNTER — Ambulatory Visit: Payer: Medicare HMO

## 2020-03-24 ENCOUNTER — Other Ambulatory Visit: Payer: Self-pay

## 2020-03-24 ENCOUNTER — Ambulatory Visit (INDEPENDENT_AMBULATORY_CARE_PROVIDER_SITE_OTHER): Payer: Medicare HMO | Admitting: Orthopedic Surgery

## 2020-03-24 ENCOUNTER — Encounter: Payer: Self-pay | Admitting: Orthopedic Surgery

## 2020-03-24 DIAGNOSIS — S72002D Fracture of unspecified part of neck of left femur, subsequent encounter for closed fracture with routine healing: Secondary | ICD-10-CM | POA: Diagnosis not present

## 2020-03-24 NOTE — Progress Notes (Signed)
POSTOP VISIT  Chief Complaint  Patient presents with  . Routine Post Op    hip left 02/17/20  . Foot Pain    left      POD # 36 Preop diagnosis left hip fracture   Postop diagnosis same   Procedure open treatment internal fixation left hip   Implant Richards hip screw with Synthes 6 5 cannulated screw with washer   135 degree 4-hole plate with a compression screw and one 6.5-32 mm Synthes screw with washer  There were no vitals taken for this visit.  Encounter Diagnosis  Name Primary?  . Closed fracture of left hip with routine healing, subsequent encounter Yes     Chief Complaint  Patient presents with  . Routine Post Op    hip left 02/17/20  . Foot Pain    left     HPI Status post internal fixation left hip with dynamic hip screw doing well except for left heel ulcer grade 1  Staples were extracted wound looks clean except for staple erythema  Patient ambulating weightbearing as tolerated  Recommend x-ray again in 6 weeks continue physical therapy  Keep the heel elevated use Vaseline and Band-Aid until it clears  Call if any problems Postoperative plan (Work, WB, No orders of the defined types were placed in this encounter. ,FU)

## 2020-03-24 NOTE — Patient Instructions (Signed)
THERAPY AT THE HOUSE   KEEP HEEL ELEVATED

## 2020-03-25 DIAGNOSIS — M1991 Primary osteoarthritis, unspecified site: Secondary | ICD-10-CM | POA: Diagnosis not present

## 2020-03-25 DIAGNOSIS — S72002D Fracture of unspecified part of neck of left femur, subsequent encounter for closed fracture with routine healing: Secondary | ICD-10-CM | POA: Diagnosis not present

## 2020-03-25 DIAGNOSIS — Z9181 History of falling: Secondary | ICD-10-CM | POA: Diagnosis not present

## 2020-03-25 DIAGNOSIS — G629 Polyneuropathy, unspecified: Secondary | ICD-10-CM | POA: Diagnosis not present

## 2020-03-25 DIAGNOSIS — M109 Gout, unspecified: Secondary | ICD-10-CM | POA: Diagnosis not present

## 2020-03-25 DIAGNOSIS — F17210 Nicotine dependence, cigarettes, uncomplicated: Secondary | ICD-10-CM | POA: Diagnosis not present

## 2020-03-25 DIAGNOSIS — L89623 Pressure ulcer of left heel, stage 3: Secondary | ICD-10-CM | POA: Diagnosis not present

## 2020-03-25 DIAGNOSIS — J449 Chronic obstructive pulmonary disease, unspecified: Secondary | ICD-10-CM | POA: Diagnosis not present

## 2020-03-28 ENCOUNTER — Telehealth: Payer: Self-pay | Admitting: Orthopedic Surgery

## 2020-03-28 DIAGNOSIS — J449 Chronic obstructive pulmonary disease, unspecified: Secondary | ICD-10-CM | POA: Diagnosis not present

## 2020-03-28 DIAGNOSIS — F17210 Nicotine dependence, cigarettes, uncomplicated: Secondary | ICD-10-CM | POA: Diagnosis not present

## 2020-03-28 DIAGNOSIS — Z9181 History of falling: Secondary | ICD-10-CM | POA: Diagnosis not present

## 2020-03-28 DIAGNOSIS — S72002D Fracture of unspecified part of neck of left femur, subsequent encounter for closed fracture with routine healing: Secondary | ICD-10-CM | POA: Diagnosis not present

## 2020-03-28 DIAGNOSIS — G629 Polyneuropathy, unspecified: Secondary | ICD-10-CM | POA: Diagnosis not present

## 2020-03-28 DIAGNOSIS — L89623 Pressure ulcer of left heel, stage 3: Secondary | ICD-10-CM | POA: Diagnosis not present

## 2020-03-28 DIAGNOSIS — M1991 Primary osteoarthritis, unspecified site: Secondary | ICD-10-CM | POA: Diagnosis not present

## 2020-03-28 DIAGNOSIS — M109 Gout, unspecified: Secondary | ICD-10-CM | POA: Diagnosis not present

## 2020-03-28 NOTE — Telephone Encounter (Signed)
Mallorie from [facility] called to request orders also, for occupational therapy: 1 time per week x 5 weeks. Asking for weight bearing status as well.  Please call her at phone# [phone]; may leave detailed message.

## 2020-03-28 NOTE — Telephone Encounter (Signed)
I called to advise on the orders  WBAT  And OT PT great.

## 2020-03-28 NOTE — Telephone Encounter (Signed)
Victor Shields from [facility] needing verbal orders for Physical therapy needing verbal orders for four times a week for 2 weeks. Then it would be 4 times a week for 1 week. Verbal orders given. FYI.

## 2020-03-29 DIAGNOSIS — F17210 Nicotine dependence, cigarettes, uncomplicated: Secondary | ICD-10-CM | POA: Diagnosis not present

## 2020-03-29 DIAGNOSIS — G629 Polyneuropathy, unspecified: Secondary | ICD-10-CM | POA: Diagnosis not present

## 2020-03-29 DIAGNOSIS — M109 Gout, unspecified: Secondary | ICD-10-CM | POA: Diagnosis not present

## 2020-03-29 DIAGNOSIS — L89623 Pressure ulcer of left heel, stage 3: Secondary | ICD-10-CM | POA: Diagnosis not present

## 2020-03-29 DIAGNOSIS — Z9181 History of falling: Secondary | ICD-10-CM | POA: Diagnosis not present

## 2020-03-29 DIAGNOSIS — J449 Chronic obstructive pulmonary disease, unspecified: Secondary | ICD-10-CM | POA: Diagnosis not present

## 2020-03-29 DIAGNOSIS — M1991 Primary osteoarthritis, unspecified site: Secondary | ICD-10-CM | POA: Diagnosis not present

## 2020-03-29 DIAGNOSIS — S72002D Fracture of unspecified part of neck of left femur, subsequent encounter for closed fracture with routine healing: Secondary | ICD-10-CM | POA: Diagnosis not present

## 2020-03-31 DIAGNOSIS — G629 Polyneuropathy, unspecified: Secondary | ICD-10-CM | POA: Diagnosis not present

## 2020-03-31 DIAGNOSIS — F17210 Nicotine dependence, cigarettes, uncomplicated: Secondary | ICD-10-CM | POA: Diagnosis not present

## 2020-03-31 DIAGNOSIS — M1991 Primary osteoarthritis, unspecified site: Secondary | ICD-10-CM | POA: Diagnosis not present

## 2020-03-31 DIAGNOSIS — L89623 Pressure ulcer of left heel, stage 3: Secondary | ICD-10-CM | POA: Diagnosis not present

## 2020-03-31 DIAGNOSIS — S72002D Fracture of unspecified part of neck of left femur, subsequent encounter for closed fracture with routine healing: Secondary | ICD-10-CM | POA: Diagnosis not present

## 2020-03-31 DIAGNOSIS — Z9181 History of falling: Secondary | ICD-10-CM | POA: Diagnosis not present

## 2020-03-31 DIAGNOSIS — M109 Gout, unspecified: Secondary | ICD-10-CM | POA: Diagnosis not present

## 2020-03-31 DIAGNOSIS — J449 Chronic obstructive pulmonary disease, unspecified: Secondary | ICD-10-CM | POA: Diagnosis not present

## 2020-04-01 DIAGNOSIS — F17210 Nicotine dependence, cigarettes, uncomplicated: Secondary | ICD-10-CM | POA: Diagnosis not present

## 2020-04-01 DIAGNOSIS — J449 Chronic obstructive pulmonary disease, unspecified: Secondary | ICD-10-CM | POA: Diagnosis not present

## 2020-04-01 DIAGNOSIS — M109 Gout, unspecified: Secondary | ICD-10-CM | POA: Diagnosis not present

## 2020-04-01 DIAGNOSIS — G629 Polyneuropathy, unspecified: Secondary | ICD-10-CM | POA: Diagnosis not present

## 2020-04-01 DIAGNOSIS — Z9181 History of falling: Secondary | ICD-10-CM | POA: Diagnosis not present

## 2020-04-01 DIAGNOSIS — M1991 Primary osteoarthritis, unspecified site: Secondary | ICD-10-CM | POA: Diagnosis not present

## 2020-04-01 DIAGNOSIS — L89623 Pressure ulcer of left heel, stage 3: Secondary | ICD-10-CM | POA: Diagnosis not present

## 2020-04-01 DIAGNOSIS — S72002D Fracture of unspecified part of neck of left femur, subsequent encounter for closed fracture with routine healing: Secondary | ICD-10-CM | POA: Diagnosis not present

## 2020-04-04 ENCOUNTER — Telehealth: Payer: Self-pay | Admitting: Family

## 2020-04-04 DIAGNOSIS — S72002D Fracture of unspecified part of neck of left femur, subsequent encounter for closed fracture with routine healing: Secondary | ICD-10-CM | POA: Diagnosis not present

## 2020-04-04 DIAGNOSIS — L89623 Pressure ulcer of left heel, stage 3: Secondary | ICD-10-CM | POA: Diagnosis not present

## 2020-04-04 DIAGNOSIS — G629 Polyneuropathy, unspecified: Secondary | ICD-10-CM | POA: Diagnosis not present

## 2020-04-04 DIAGNOSIS — M109 Gout, unspecified: Secondary | ICD-10-CM | POA: Diagnosis not present

## 2020-04-04 DIAGNOSIS — J449 Chronic obstructive pulmonary disease, unspecified: Secondary | ICD-10-CM | POA: Diagnosis not present

## 2020-04-04 DIAGNOSIS — Z9181 History of falling: Secondary | ICD-10-CM | POA: Diagnosis not present

## 2020-04-04 DIAGNOSIS — Z09 Encounter for follow-up examination after completed treatment for conditions other than malignant neoplasm: Secondary | ICD-10-CM

## 2020-04-04 DIAGNOSIS — W19XXXD Unspecified fall, subsequent encounter: Secondary | ICD-10-CM

## 2020-04-04 DIAGNOSIS — S72002G Fracture of unspecified part of neck of left femur, subsequent encounter for closed fracture with delayed healing: Secondary | ICD-10-CM

## 2020-04-04 DIAGNOSIS — M1991 Primary osteoarthritis, unspecified site: Secondary | ICD-10-CM | POA: Diagnosis not present

## 2020-04-04 DIAGNOSIS — F17210 Nicotine dependence, cigarettes, uncomplicated: Secondary | ICD-10-CM | POA: Diagnosis not present

## 2020-04-04 MED ORDER — HYDROCODONE-ACETAMINOPHEN 5-325 MG PO TABS: 1.0000 | ORAL_TABLET | Freq: Four times a day (QID) | ORAL | 0 refills | Status: DC | PRN

## 2020-04-04 NOTE — Telephone Encounter (Signed)
Prescription sent to pharmacy.

## 2020-04-04 NOTE — Telephone Encounter (Signed)
Left detailed message per DPR  

## 2020-04-04 NOTE — Telephone Encounter (Signed)
°  Prescription Request  04/04/2020  What is the name of the medication or equipment? Pain Med  Have you contacted your pharmacy to request a refill? (if applicable) Yes  Which pharmacy would you like this sent to? [facility]  Pts wife called and said that pt has ran out of his pain Rx he has been taking since having hip surgery and needs refill because he is still experiencing a lot of pain.   Patient notified that their request is being sent to the clinical staff for review and that they should receive a response within 2 business days.

## 2020-04-05 DIAGNOSIS — F17210 Nicotine dependence, cigarettes, uncomplicated: Secondary | ICD-10-CM | POA: Diagnosis not present

## 2020-04-05 DIAGNOSIS — Z9181 History of falling: Secondary | ICD-10-CM | POA: Diagnosis not present

## 2020-04-05 DIAGNOSIS — G629 Polyneuropathy, unspecified: Secondary | ICD-10-CM | POA: Diagnosis not present

## 2020-04-05 DIAGNOSIS — M1991 Primary osteoarthritis, unspecified site: Secondary | ICD-10-CM | POA: Diagnosis not present

## 2020-04-05 DIAGNOSIS — M109 Gout, unspecified: Secondary | ICD-10-CM | POA: Diagnosis not present

## 2020-04-05 DIAGNOSIS — L89623 Pressure ulcer of left heel, stage 3: Secondary | ICD-10-CM | POA: Diagnosis not present

## 2020-04-05 DIAGNOSIS — J449 Chronic obstructive pulmonary disease, unspecified: Secondary | ICD-10-CM | POA: Diagnosis not present

## 2020-04-05 DIAGNOSIS — S72002D Fracture of unspecified part of neck of left femur, subsequent encounter for closed fracture with routine healing: Secondary | ICD-10-CM | POA: Diagnosis not present

## 2020-04-07 DIAGNOSIS — M1991 Primary osteoarthritis, unspecified site: Secondary | ICD-10-CM | POA: Diagnosis not present

## 2020-04-07 DIAGNOSIS — M109 Gout, unspecified: Secondary | ICD-10-CM | POA: Diagnosis not present

## 2020-04-07 DIAGNOSIS — G629 Polyneuropathy, unspecified: Secondary | ICD-10-CM | POA: Diagnosis not present

## 2020-04-07 DIAGNOSIS — L89623 Pressure ulcer of left heel, stage 3: Secondary | ICD-10-CM | POA: Diagnosis not present

## 2020-04-07 DIAGNOSIS — J449 Chronic obstructive pulmonary disease, unspecified: Secondary | ICD-10-CM | POA: Diagnosis not present

## 2020-04-07 DIAGNOSIS — Z9181 History of falling: Secondary | ICD-10-CM | POA: Diagnosis not present

## 2020-04-07 DIAGNOSIS — F17210 Nicotine dependence, cigarettes, uncomplicated: Secondary | ICD-10-CM | POA: Diagnosis not present

## 2020-04-07 DIAGNOSIS — S72002D Fracture of unspecified part of neck of left femur, subsequent encounter for closed fracture with routine healing: Secondary | ICD-10-CM | POA: Diagnosis not present

## 2020-04-08 DIAGNOSIS — F17210 Nicotine dependence, cigarettes, uncomplicated: Secondary | ICD-10-CM | POA: Diagnosis not present

## 2020-04-08 DIAGNOSIS — S72002D Fracture of unspecified part of neck of left femur, subsequent encounter for closed fracture with routine healing: Secondary | ICD-10-CM | POA: Diagnosis not present

## 2020-04-08 DIAGNOSIS — Z9181 History of falling: Secondary | ICD-10-CM | POA: Diagnosis not present

## 2020-04-08 DIAGNOSIS — G629 Polyneuropathy, unspecified: Secondary | ICD-10-CM | POA: Diagnosis not present

## 2020-04-08 DIAGNOSIS — M1991 Primary osteoarthritis, unspecified site: Secondary | ICD-10-CM | POA: Diagnosis not present

## 2020-04-08 DIAGNOSIS — L89623 Pressure ulcer of left heel, stage 3: Secondary | ICD-10-CM | POA: Diagnosis not present

## 2020-04-08 DIAGNOSIS — M109 Gout, unspecified: Secondary | ICD-10-CM | POA: Diagnosis not present

## 2020-04-08 DIAGNOSIS — J449 Chronic obstructive pulmonary disease, unspecified: Secondary | ICD-10-CM | POA: Diagnosis not present

## 2020-04-12 DIAGNOSIS — G629 Polyneuropathy, unspecified: Secondary | ICD-10-CM | POA: Diagnosis not present

## 2020-04-12 DIAGNOSIS — M109 Gout, unspecified: Secondary | ICD-10-CM | POA: Diagnosis not present

## 2020-04-12 DIAGNOSIS — M1991 Primary osteoarthritis, unspecified site: Secondary | ICD-10-CM | POA: Diagnosis not present

## 2020-04-12 DIAGNOSIS — J449 Chronic obstructive pulmonary disease, unspecified: Secondary | ICD-10-CM | POA: Diagnosis not present

## 2020-04-12 DIAGNOSIS — F17210 Nicotine dependence, cigarettes, uncomplicated: Secondary | ICD-10-CM | POA: Diagnosis not present

## 2020-04-12 DIAGNOSIS — L89623 Pressure ulcer of left heel, stage 3: Secondary | ICD-10-CM | POA: Diagnosis not present

## 2020-04-12 DIAGNOSIS — S72002D Fracture of unspecified part of neck of left femur, subsequent encounter for closed fracture with routine healing: Secondary | ICD-10-CM | POA: Diagnosis not present

## 2020-04-12 DIAGNOSIS — Z9181 History of falling: Secondary | ICD-10-CM | POA: Diagnosis not present

## 2020-04-14 DIAGNOSIS — M109 Gout, unspecified: Secondary | ICD-10-CM | POA: Diagnosis not present

## 2020-04-14 DIAGNOSIS — L89623 Pressure ulcer of left heel, stage 3: Secondary | ICD-10-CM | POA: Diagnosis not present

## 2020-04-14 DIAGNOSIS — M1991 Primary osteoarthritis, unspecified site: Secondary | ICD-10-CM | POA: Diagnosis not present

## 2020-04-14 DIAGNOSIS — F17210 Nicotine dependence, cigarettes, uncomplicated: Secondary | ICD-10-CM | POA: Diagnosis not present

## 2020-04-14 DIAGNOSIS — Z9181 History of falling: Secondary | ICD-10-CM | POA: Diagnosis not present

## 2020-04-14 DIAGNOSIS — J449 Chronic obstructive pulmonary disease, unspecified: Secondary | ICD-10-CM | POA: Diagnosis not present

## 2020-04-14 DIAGNOSIS — S72002D Fracture of unspecified part of neck of left femur, subsequent encounter for closed fracture with routine healing: Secondary | ICD-10-CM | POA: Diagnosis not present

## 2020-04-14 DIAGNOSIS — G629 Polyneuropathy, unspecified: Secondary | ICD-10-CM | POA: Diagnosis not present

## 2020-04-15 DIAGNOSIS — M109 Gout, unspecified: Secondary | ICD-10-CM | POA: Diagnosis not present

## 2020-04-15 DIAGNOSIS — S72002D Fracture of unspecified part of neck of left femur, subsequent encounter for closed fracture with routine healing: Secondary | ICD-10-CM | POA: Diagnosis not present

## 2020-04-15 DIAGNOSIS — J449 Chronic obstructive pulmonary disease, unspecified: Secondary | ICD-10-CM | POA: Diagnosis not present

## 2020-04-15 DIAGNOSIS — F17210 Nicotine dependence, cigarettes, uncomplicated: Secondary | ICD-10-CM | POA: Diagnosis not present

## 2020-04-15 DIAGNOSIS — L89623 Pressure ulcer of left heel, stage 3: Secondary | ICD-10-CM | POA: Diagnosis not present

## 2020-04-15 DIAGNOSIS — Z9181 History of falling: Secondary | ICD-10-CM | POA: Diagnosis not present

## 2020-04-15 DIAGNOSIS — G629 Polyneuropathy, unspecified: Secondary | ICD-10-CM | POA: Diagnosis not present

## 2020-04-15 DIAGNOSIS — M1991 Primary osteoarthritis, unspecified site: Secondary | ICD-10-CM | POA: Diagnosis not present

## 2020-04-19 ENCOUNTER — Telehealth: Payer: Self-pay | Admitting: Orthopedic Surgery

## 2020-04-19 DIAGNOSIS — F17210 Nicotine dependence, cigarettes, uncomplicated: Secondary | ICD-10-CM | POA: Diagnosis not present

## 2020-04-19 DIAGNOSIS — Z9181 History of falling: Secondary | ICD-10-CM | POA: Diagnosis not present

## 2020-04-19 DIAGNOSIS — M1991 Primary osteoarthritis, unspecified site: Secondary | ICD-10-CM | POA: Diagnosis not present

## 2020-04-19 DIAGNOSIS — J449 Chronic obstructive pulmonary disease, unspecified: Secondary | ICD-10-CM | POA: Diagnosis not present

## 2020-04-19 DIAGNOSIS — L89623 Pressure ulcer of left heel, stage 3: Secondary | ICD-10-CM | POA: Diagnosis not present

## 2020-04-19 DIAGNOSIS — G629 Polyneuropathy, unspecified: Secondary | ICD-10-CM | POA: Diagnosis not present

## 2020-04-19 DIAGNOSIS — S72002D Fracture of unspecified part of neck of left femur, subsequent encounter for closed fracture with routine healing: Secondary | ICD-10-CM | POA: Diagnosis not present

## 2020-04-19 DIAGNOSIS — M109 Gout, unspecified: Secondary | ICD-10-CM | POA: Diagnosis not present

## 2020-04-19 NOTE — Telephone Encounter (Signed)
Call received from [facility], [facility], ph# [phone], requesting verbal orders for expanding occupational therapy to: 1 time a week for 3 more weeks   -please advise.

## 2020-04-19 NOTE — Telephone Encounter (Signed)
Called in verbal orders.  

## 2020-04-20 DIAGNOSIS — F17210 Nicotine dependence, cigarettes, uncomplicated: Secondary | ICD-10-CM | POA: Diagnosis not present

## 2020-04-20 DIAGNOSIS — G629 Polyneuropathy, unspecified: Secondary | ICD-10-CM | POA: Diagnosis not present

## 2020-04-20 DIAGNOSIS — M1991 Primary osteoarthritis, unspecified site: Secondary | ICD-10-CM | POA: Diagnosis not present

## 2020-04-20 DIAGNOSIS — M109 Gout, unspecified: Secondary | ICD-10-CM | POA: Diagnosis not present

## 2020-04-20 DIAGNOSIS — L89623 Pressure ulcer of left heel, stage 3: Secondary | ICD-10-CM | POA: Diagnosis not present

## 2020-04-20 DIAGNOSIS — J449 Chronic obstructive pulmonary disease, unspecified: Secondary | ICD-10-CM | POA: Diagnosis not present

## 2020-04-20 DIAGNOSIS — S72002D Fracture of unspecified part of neck of left femur, subsequent encounter for closed fracture with routine healing: Secondary | ICD-10-CM | POA: Diagnosis not present

## 2020-04-20 DIAGNOSIS — Z9181 History of falling: Secondary | ICD-10-CM | POA: Diagnosis not present

## 2020-04-21 DIAGNOSIS — L89623 Pressure ulcer of left heel, stage 3: Secondary | ICD-10-CM | POA: Diagnosis not present

## 2020-04-21 DIAGNOSIS — J449 Chronic obstructive pulmonary disease, unspecified: Secondary | ICD-10-CM | POA: Diagnosis not present

## 2020-04-21 DIAGNOSIS — M109 Gout, unspecified: Secondary | ICD-10-CM | POA: Diagnosis not present

## 2020-04-21 DIAGNOSIS — G629 Polyneuropathy, unspecified: Secondary | ICD-10-CM | POA: Diagnosis not present

## 2020-04-21 DIAGNOSIS — Z9181 History of falling: Secondary | ICD-10-CM | POA: Diagnosis not present

## 2020-04-21 DIAGNOSIS — S72002D Fracture of unspecified part of neck of left femur, subsequent encounter for closed fracture with routine healing: Secondary | ICD-10-CM | POA: Diagnosis not present

## 2020-04-21 DIAGNOSIS — M1991 Primary osteoarthritis, unspecified site: Secondary | ICD-10-CM | POA: Diagnosis not present

## 2020-04-21 DIAGNOSIS — F17210 Nicotine dependence, cigarettes, uncomplicated: Secondary | ICD-10-CM | POA: Diagnosis not present

## 2020-04-25 DIAGNOSIS — Z9181 History of falling: Secondary | ICD-10-CM | POA: Diagnosis not present

## 2020-04-25 DIAGNOSIS — G629 Polyneuropathy, unspecified: Secondary | ICD-10-CM | POA: Diagnosis not present

## 2020-04-25 DIAGNOSIS — S72002D Fracture of unspecified part of neck of left femur, subsequent encounter for closed fracture with routine healing: Secondary | ICD-10-CM | POA: Diagnosis not present

## 2020-04-25 DIAGNOSIS — M109 Gout, unspecified: Secondary | ICD-10-CM | POA: Diagnosis not present

## 2020-04-25 DIAGNOSIS — F17210 Nicotine dependence, cigarettes, uncomplicated: Secondary | ICD-10-CM | POA: Diagnosis not present

## 2020-04-25 DIAGNOSIS — L89623 Pressure ulcer of left heel, stage 3: Secondary | ICD-10-CM | POA: Diagnosis not present

## 2020-04-25 DIAGNOSIS — J449 Chronic obstructive pulmonary disease, unspecified: Secondary | ICD-10-CM | POA: Diagnosis not present

## 2020-04-25 DIAGNOSIS — M1991 Primary osteoarthritis, unspecified site: Secondary | ICD-10-CM | POA: Diagnosis not present

## 2020-04-26 DIAGNOSIS — Z9181 History of falling: Secondary | ICD-10-CM | POA: Diagnosis not present

## 2020-04-26 DIAGNOSIS — L89623 Pressure ulcer of left heel, stage 3: Secondary | ICD-10-CM | POA: Diagnosis not present

## 2020-04-26 DIAGNOSIS — M1991 Primary osteoarthritis, unspecified site: Secondary | ICD-10-CM | POA: Diagnosis not present

## 2020-04-26 DIAGNOSIS — S72002D Fracture of unspecified part of neck of left femur, subsequent encounter for closed fracture with routine healing: Secondary | ICD-10-CM | POA: Diagnosis not present

## 2020-04-26 DIAGNOSIS — F17210 Nicotine dependence, cigarettes, uncomplicated: Secondary | ICD-10-CM | POA: Diagnosis not present

## 2020-04-26 DIAGNOSIS — G629 Polyneuropathy, unspecified: Secondary | ICD-10-CM | POA: Diagnosis not present

## 2020-04-26 DIAGNOSIS — M109 Gout, unspecified: Secondary | ICD-10-CM | POA: Diagnosis not present

## 2020-04-26 DIAGNOSIS — J449 Chronic obstructive pulmonary disease, unspecified: Secondary | ICD-10-CM | POA: Diagnosis not present

## 2020-05-02 DIAGNOSIS — S72002D Fracture of unspecified part of neck of left femur, subsequent encounter for closed fracture with routine healing: Secondary | ICD-10-CM | POA: Diagnosis not present

## 2020-05-02 DIAGNOSIS — Z9181 History of falling: Secondary | ICD-10-CM | POA: Diagnosis not present

## 2020-05-02 DIAGNOSIS — M1991 Primary osteoarthritis, unspecified site: Secondary | ICD-10-CM | POA: Diagnosis not present

## 2020-05-02 DIAGNOSIS — L89623 Pressure ulcer of left heel, stage 3: Secondary | ICD-10-CM | POA: Diagnosis not present

## 2020-05-02 DIAGNOSIS — F17210 Nicotine dependence, cigarettes, uncomplicated: Secondary | ICD-10-CM | POA: Diagnosis not present

## 2020-05-02 DIAGNOSIS — M109 Gout, unspecified: Secondary | ICD-10-CM | POA: Diagnosis not present

## 2020-05-02 DIAGNOSIS — J449 Chronic obstructive pulmonary disease, unspecified: Secondary | ICD-10-CM | POA: Diagnosis not present

## 2020-05-02 DIAGNOSIS — G629 Polyneuropathy, unspecified: Secondary | ICD-10-CM | POA: Diagnosis not present

## 2020-05-03 DIAGNOSIS — Z9181 History of falling: Secondary | ICD-10-CM | POA: Diagnosis not present

## 2020-05-03 DIAGNOSIS — F17210 Nicotine dependence, cigarettes, uncomplicated: Secondary | ICD-10-CM | POA: Diagnosis not present

## 2020-05-03 DIAGNOSIS — G629 Polyneuropathy, unspecified: Secondary | ICD-10-CM | POA: Diagnosis not present

## 2020-05-03 DIAGNOSIS — J449 Chronic obstructive pulmonary disease, unspecified: Secondary | ICD-10-CM | POA: Diagnosis not present

## 2020-05-03 DIAGNOSIS — M1991 Primary osteoarthritis, unspecified site: Secondary | ICD-10-CM | POA: Diagnosis not present

## 2020-05-03 DIAGNOSIS — S72002D Fracture of unspecified part of neck of left femur, subsequent encounter for closed fracture with routine healing: Secondary | ICD-10-CM | POA: Diagnosis not present

## 2020-05-03 DIAGNOSIS — L89623 Pressure ulcer of left heel, stage 3: Secondary | ICD-10-CM | POA: Diagnosis not present

## 2020-05-03 DIAGNOSIS — M109 Gout, unspecified: Secondary | ICD-10-CM | POA: Diagnosis not present

## 2020-05-04 DIAGNOSIS — M1991 Primary osteoarthritis, unspecified site: Secondary | ICD-10-CM | POA: Diagnosis not present

## 2020-05-04 DIAGNOSIS — S72002D Fracture of unspecified part of neck of left femur, subsequent encounter for closed fracture with routine healing: Secondary | ICD-10-CM | POA: Diagnosis not present

## 2020-05-04 DIAGNOSIS — J449 Chronic obstructive pulmonary disease, unspecified: Secondary | ICD-10-CM | POA: Diagnosis not present

## 2020-05-04 DIAGNOSIS — Z9181 History of falling: Secondary | ICD-10-CM | POA: Diagnosis not present

## 2020-05-04 DIAGNOSIS — M109 Gout, unspecified: Secondary | ICD-10-CM | POA: Diagnosis not present

## 2020-05-04 DIAGNOSIS — G629 Polyneuropathy, unspecified: Secondary | ICD-10-CM | POA: Diagnosis not present

## 2020-05-04 DIAGNOSIS — F17210 Nicotine dependence, cigarettes, uncomplicated: Secondary | ICD-10-CM | POA: Diagnosis not present

## 2020-05-04 DIAGNOSIS — L89623 Pressure ulcer of left heel, stage 3: Secondary | ICD-10-CM | POA: Diagnosis not present

## 2020-05-05 ENCOUNTER — Other Ambulatory Visit: Payer: Self-pay

## 2020-05-05 ENCOUNTER — Ambulatory Visit (INDEPENDENT_AMBULATORY_CARE_PROVIDER_SITE_OTHER): Payer: Medicare HMO | Admitting: Orthopedic Surgery

## 2020-05-05 ENCOUNTER — Encounter: Payer: Self-pay | Admitting: Orthopedic Surgery

## 2020-05-05 ENCOUNTER — Ambulatory Visit: Payer: Medicare HMO

## 2020-05-05 DIAGNOSIS — S72002D Fracture of unspecified part of neck of left femur, subsequent encounter for closed fracture with routine healing: Secondary | ICD-10-CM

## 2020-05-05 NOTE — Progress Notes (Signed)
Chief Complaint  Patient presents with  . Routine Post Op    left hip 02/17/20 ORIF    Encounter Diagnosis  Name Primary?  . Closed fracture of left hip with routine healing, subsequent encounter ORIF 02/17/20 Yes   69 year old male says his left hip is improving he did have some thigh pain but is relieved by ice  Exam shows that his leg is moving well his leg lengths are equal rotational alignment is normal  X-ray his hardware looks good his fracture looks healed  I am releasing him today he can come back if he has any problems

## 2020-05-09 ENCOUNTER — Other Ambulatory Visit: Payer: Self-pay

## 2020-05-09 ENCOUNTER — Ambulatory Visit (INDEPENDENT_AMBULATORY_CARE_PROVIDER_SITE_OTHER): Payer: Medicare HMO

## 2020-05-09 DIAGNOSIS — S72002D Fracture of unspecified part of neck of left femur, subsequent encounter for closed fracture with routine healing: Secondary | ICD-10-CM

## 2020-05-09 DIAGNOSIS — M1991 Primary osteoarthritis, unspecified site: Secondary | ICD-10-CM | POA: Diagnosis not present

## 2020-05-09 DIAGNOSIS — J449 Chronic obstructive pulmonary disease, unspecified: Secondary | ICD-10-CM

## 2020-05-09 DIAGNOSIS — L89623 Pressure ulcer of left heel, stage 3: Secondary | ICD-10-CM

## 2020-05-09 DIAGNOSIS — F17210 Nicotine dependence, cigarettes, uncomplicated: Secondary | ICD-10-CM | POA: Diagnosis not present

## 2020-05-09 DIAGNOSIS — Z9181 History of falling: Secondary | ICD-10-CM | POA: Diagnosis not present

## 2020-05-09 DIAGNOSIS — G629 Polyneuropathy, unspecified: Secondary | ICD-10-CM

## 2020-05-09 DIAGNOSIS — M109 Gout, unspecified: Secondary | ICD-10-CM

## 2020-05-10 DIAGNOSIS — L89623 Pressure ulcer of left heel, stage 3: Secondary | ICD-10-CM | POA: Diagnosis not present

## 2020-05-10 DIAGNOSIS — F17210 Nicotine dependence, cigarettes, uncomplicated: Secondary | ICD-10-CM | POA: Diagnosis not present

## 2020-05-10 DIAGNOSIS — G629 Polyneuropathy, unspecified: Secondary | ICD-10-CM | POA: Diagnosis not present

## 2020-05-10 DIAGNOSIS — M1991 Primary osteoarthritis, unspecified site: Secondary | ICD-10-CM | POA: Diagnosis not present

## 2020-05-10 DIAGNOSIS — J449 Chronic obstructive pulmonary disease, unspecified: Secondary | ICD-10-CM | POA: Diagnosis not present

## 2020-05-10 DIAGNOSIS — M109 Gout, unspecified: Secondary | ICD-10-CM | POA: Diagnosis not present

## 2020-05-10 DIAGNOSIS — S72002D Fracture of unspecified part of neck of left femur, subsequent encounter for closed fracture with routine healing: Secondary | ICD-10-CM | POA: Diagnosis not present

## 2020-05-10 DIAGNOSIS — Z9181 History of falling: Secondary | ICD-10-CM | POA: Diagnosis not present

## 2020-05-16 DIAGNOSIS — S72002D Fracture of unspecified part of neck of left femur, subsequent encounter for closed fracture with routine healing: Secondary | ICD-10-CM | POA: Diagnosis not present

## 2020-05-16 DIAGNOSIS — F17210 Nicotine dependence, cigarettes, uncomplicated: Secondary | ICD-10-CM | POA: Diagnosis not present

## 2020-05-16 DIAGNOSIS — G629 Polyneuropathy, unspecified: Secondary | ICD-10-CM | POA: Diagnosis not present

## 2020-05-16 DIAGNOSIS — M109 Gout, unspecified: Secondary | ICD-10-CM | POA: Diagnosis not present

## 2020-05-16 DIAGNOSIS — Z9181 History of falling: Secondary | ICD-10-CM | POA: Diagnosis not present

## 2020-05-16 DIAGNOSIS — L89623 Pressure ulcer of left heel, stage 3: Secondary | ICD-10-CM | POA: Diagnosis not present

## 2020-05-16 DIAGNOSIS — M1991 Primary osteoarthritis, unspecified site: Secondary | ICD-10-CM | POA: Diagnosis not present

## 2020-05-16 DIAGNOSIS — J449 Chronic obstructive pulmonary disease, unspecified: Secondary | ICD-10-CM | POA: Diagnosis not present

## 2020-05-18 DIAGNOSIS — S72002D Fracture of unspecified part of neck of left femur, subsequent encounter for closed fracture with routine healing: Secondary | ICD-10-CM | POA: Diagnosis not present

## 2020-05-18 DIAGNOSIS — L89623 Pressure ulcer of left heel, stage 3: Secondary | ICD-10-CM | POA: Diagnosis not present

## 2020-05-18 DIAGNOSIS — Z9181 History of falling: Secondary | ICD-10-CM | POA: Diagnosis not present

## 2020-05-18 DIAGNOSIS — M109 Gout, unspecified: Secondary | ICD-10-CM | POA: Diagnosis not present

## 2020-05-18 DIAGNOSIS — J449 Chronic obstructive pulmonary disease, unspecified: Secondary | ICD-10-CM | POA: Diagnosis not present

## 2020-05-18 DIAGNOSIS — F17210 Nicotine dependence, cigarettes, uncomplicated: Secondary | ICD-10-CM | POA: Diagnosis not present

## 2020-05-18 DIAGNOSIS — G629 Polyneuropathy, unspecified: Secondary | ICD-10-CM | POA: Diagnosis not present

## 2020-05-18 DIAGNOSIS — M1991 Primary osteoarthritis, unspecified site: Secondary | ICD-10-CM | POA: Diagnosis not present

## 2020-05-23 ENCOUNTER — Other Ambulatory Visit: Payer: Self-pay | Admitting: Family

## 2020-05-23 DIAGNOSIS — B356 Tinea cruris: Secondary | ICD-10-CM

## 2020-06-06 ENCOUNTER — Other Ambulatory Visit: Payer: Self-pay | Admitting: Family

## 2020-06-06 DIAGNOSIS — J42 Unspecified chronic bronchitis: Secondary | ICD-10-CM

## 2020-07-15 ENCOUNTER — Other Ambulatory Visit: Payer: Self-pay | Admitting: Family Medicine

## 2020-07-20 ENCOUNTER — Ambulatory Visit: Payer: Medicare HMO | Admitting: Family

## 2020-07-28 ENCOUNTER — Other Ambulatory Visit: Payer: Self-pay | Admitting: Family

## 2020-07-28 DIAGNOSIS — J42 Unspecified chronic bronchitis: Secondary | ICD-10-CM

## 2020-08-05 ENCOUNTER — Encounter: Payer: Self-pay | Admitting: Family

## 2020-11-15 ENCOUNTER — Telehealth: Payer: Self-pay | Admitting: *Deleted

## 2020-11-15 ENCOUNTER — Ambulatory Visit (INDEPENDENT_AMBULATORY_CARE_PROVIDER_SITE_OTHER): Payer: Medicare HMO | Admitting: Family

## 2020-11-15 ENCOUNTER — Other Ambulatory Visit: Payer: Self-pay

## 2020-11-15 ENCOUNTER — Encounter: Payer: Self-pay | Admitting: Family

## 2020-11-15 VITALS — BP 127/71 | HR 91 | Temp 99.6°F | Ht 68.0 in | Wt 159.6 lb

## 2020-11-15 DIAGNOSIS — B356 Tinea cruris: Secondary | ICD-10-CM | POA: Diagnosis not present

## 2020-11-15 DIAGNOSIS — Z1159 Encounter for screening for other viral diseases: Secondary | ICD-10-CM | POA: Diagnosis not present

## 2020-11-15 DIAGNOSIS — R21 Rash and other nonspecific skin eruption: Secondary | ICD-10-CM | POA: Diagnosis not present

## 2020-11-15 DIAGNOSIS — J438 Other emphysema: Secondary | ICD-10-CM

## 2020-11-15 DIAGNOSIS — J42 Unspecified chronic bronchitis: Secondary | ICD-10-CM | POA: Diagnosis not present

## 2020-11-15 DIAGNOSIS — Z13220 Encounter for screening for lipoid disorders: Secondary | ICD-10-CM | POA: Diagnosis not present

## 2020-11-15 DIAGNOSIS — Z23 Encounter for immunization: Secondary | ICD-10-CM | POA: Diagnosis not present

## 2020-11-15 DIAGNOSIS — Z1211 Encounter for screening for malignant neoplasm of colon: Secondary | ICD-10-CM | POA: Diagnosis not present

## 2020-11-15 DIAGNOSIS — Z136 Encounter for screening for cardiovascular disorders: Secondary | ICD-10-CM | POA: Diagnosis not present

## 2020-11-15 DIAGNOSIS — Z720 Tobacco use: Secondary | ICD-10-CM

## 2020-11-15 DIAGNOSIS — S41119A Laceration without foreign body of unspecified upper arm, initial encounter: Secondary | ICD-10-CM

## 2020-11-15 MED ORDER — CLOTRIMAZOLE 1 % EX CREA: 1.0000 "application " | TOPICAL_CREAM | CUTANEOUS | 6 refills | Status: DC | PRN

## 2020-11-15 MED ORDER — ALBUTEROL SULFATE HFA 108 (90 BASE) MCG/ACT IN AERS: 2.0000 | INHALATION_SPRAY | Freq: Four times a day (QID) | RESPIRATORY_TRACT | 0 refills | Status: DC | PRN

## 2020-11-15 MED ORDER — NYSTATIN 100000 UNIT/GM EX POWD: 1.0000 "application " | Freq: Three times a day (TID) | CUTANEOUS | 0 refills | Status: DC

## 2020-11-15 MED ORDER — BUDESONIDE-FORMOTEROL FUMARATE 160-4.5 MCG/ACT IN AERO: INHALATION_SPRAY | RESPIRATORY_TRACT | 3 refills | Status: DC

## 2020-11-15 NOTE — Telephone Encounter (Signed)
Nystatin 100000 UNIT/GM powder PA started Key: [id number]  Sent to plan

## 2020-11-15 NOTE — Progress Notes (Addendum)
Subjective:    Patient ID: Victor Shields, male    DOB: January 11, 1951, 70 y.o.   MRN: [id number]  Chief Complaint  Patient presents with  . Medical Management of Chronic Issues  . Ear Fullness  . Abrasion    ON ARM  . FEET PAIN   PT presents to the office today for chronic follow up. He has a hx of left hip fracture in 02/2020. Using rolling walker.   He is complaining of bilateral feet cramps that come and goes.   He reports he was walking through a door and scraped his arm last week. He has been dressing it daily and applying antibiotic ointment. Denies any pain.   Ear Fullness  There is pain in both ears. The current episode started 1 to 4 weeks ago. The problem has been waxing and waning. Associated symptoms include hearing loss. Pertinent negatives include no ear discharge, headaches or rhinorrhea. He has tried nothing for the symptoms. The treatment provided no relief.  Nicotine Dependence Presents for follow-up visit. His urge triggers include company of smokers. The symptoms have been stable. He smokes < 1/2 a pack of cigarettes per day.  COPD He continues to smoke daily. States his breathing is stable. Uses Symbicort as needed.    Review of Systems  HENT: Positive for hearing loss. Negative for ear discharge and rhinorrhea.   Neurological: Negative for headaches.  All other systems reviewed and are negative.      Objective:   Physical Exam Vitals reviewed.  Constitutional:      General: He is not in acute distress.    Appearance: He is well-developed.  HENT:     Head: Normocephalic.     Right Ear: Tympanic membrane normal.     Left Ear: Tympanic membrane normal.  Eyes:     General:        Right eye: No discharge.        Left eye: No discharge.     Pupils: Pupils are equal, round, and reactive to light.  Neck:     Thyroid: No thyromegaly.  Cardiovascular:     Rate and Rhythm: Normal rate and regular rhythm.     Heart sounds: Normal heart sounds. No  murmur heard.   Pulmonary:     Effort: Pulmonary effort is normal. No respiratory distress.     Breath sounds: Wheezing and rhonchi present.  Abdominal:     General: Bowel sounds are normal. There is no distension.     Palpations: Abdomen is soft.     Tenderness: There is no abdominal tenderness.  Musculoskeletal:        General: No tenderness. Normal range of motion.     Cervical back: Normal range of motion and neck supple.  Skin:    General: Skin is warm and dry.     Findings: Wound present. No erythema or rash.          Comments: Large Skin tear on right forearm  Neurological:     Mental Status: He is alert and oriented to person, place, and time.     Cranial Nerves: No cranial nerve deficit.     Motor: Weakness (using rolling wheelchair ) present.     Deep Tendon Reflexes: Reflexes are normal and symmetric.  Psychiatric:        Behavior: Behavior normal.        Thought Content: Thought content normal.        Judgment: Judgment normal.    Large  Skin tear on right forearm, Vaseline dressing applied and wrapped with bandage.    BP 127/71   Pulse 91   Temp 99.6 F (37.6 C) (Temporal)   Ht _0  (1.727 m)   Wt 159 lb 9.6 oz (72.4 kg)   BMI 24.27 kg/m      Assessment & Plan:  Victor Shields comes in today with chief complaint of Medical Management of Chronic Issues, Ear Fullness, Abrasion (ON ARM), and FEET PAIN   Diagnosis and orders addressed:  1. Chronic bronchitis, unspecified chronic bronchitis type (HCC) - albuterol (VENTOLIN HFA) 108 (90 Base) MCG/ACT inhaler; Inhale 2 puffs into the lungs every 6 (six) hours as needed for wheezing or shortness of breath.  Dispense: 8.5 g; Refill: 0 - CMP14+EGFR - CBC with Differential/Platelet  2. Rash of groin - clotrimazole (LOTRIMIN) 1 % cream; Apply 1 application topically as needed.  Dispense: 113 g; Refill: 6 - CMP14+EGFR - CBC with Differential/Platelet  3. Jock itch - clotrimazole (LOTRIMIN) 1 % cream;  Apply 1 application topically as needed.  Dispense: 113 g; Refill: 6 - CMP14+EGFR - CBC with Differential/Platelet  4. Tinea cruris - nystatin (MYCOSTATIN/NYSTOP) powder; Apply 1 application topically 3 (three) times daily.  Dispense: 60 g; Refill: 0 - CMP14+EGFR - CBC with Differential/Platelet  5. Other emphysema ([place]) - CMP14+EGFR - CBC with Differential/Platelet  6. Tobacco abuse - CMP14+EGFR - CBC with Differential/Platelet  7. Colon cancer screening - Cologuard - CMP14+EGFR - CBC with Differential/Platelet  8. Need for hepatitis C screening test - Hepatitis C antibody - CMP14+EGFR - CBC with Differential/Platelet  9. Screening cholesterol level - CMP14+EGFR - CBC with Differential/Platelet - Lipid panel  10. Skin tear of upper arm without complication, initial encounter Area wrapped Continue to keep clean and dry   Labs pending Health Maintenance reviewed Diet and exercise encouraged  Follow up plan: 6 months    Evelina Dun, FNP

## 2020-11-15 NOTE — Patient Instructions (Signed)
Skin Tear A skin tear is a wound in which the top layers of skin have peeled off from the deeper skin or tissues underneath. This is a common problem as people get older because the skin becomes thinner and more fragile. In addition, some medicines, such as oral corticosteroids, can lead to thinning skin if they are taken for long periods of time. A skin tear is often repaired with tape or skin adhesive strips. Depending on the location of the wound, a bandage (dressing) may be applied over the tape or adhesive strips. Follow these instructions at home: Wound care  Clean the wound as told by your health care provider. You may be instructed to keep the wound dry for the first few days. If you are told to clean the wound: ? Wash the wound as told by your health care provider. This may include using mild soap and water, a wound cleanser, or a salt-water (saline) solution. ? If using soap, rinse the wound with water to remove all soap. ? Do not rub the wound dry. Pat it gently with a clean towel or let it air-dry.  Change any dressings as told by your health care provider. This may include changing the dressing if it gets wet, gets dirty, or starts to smell bad. ? Wash your hands with soap and water for at least 20 seconds before and after you change your bandage (dressing). If soap and water are not available, use hand sanitizer. ? Leave tape or skin adhesive strips in place. These skin closures may need to stay in place for 2 weeks or longer. If adhesive strip edges start to loosen and curl up, you may trim the loose edges. Do not remove adhesive strips completely unless your health care provider tells you to do that.  Check your wound every day for signs of infection. Check for: ? Redness, swelling, or pain. ? More fluid or blood. ? Warmth. ? Pus or a bad smell.  Do not scratch or pick at the wound.  Protect the injured area until it has healed.   Medicines  Take or apply over-the-counter  and prescription medicines only as told by your health care provider.  If you were prescribed an antibiotic medicine, take or apply it as told by your health care provider. Do not stop using the antibiotic even if your condition improves. General instructions  Keep the dressing dry as told by your health care provider.  Do not take baths, swim, use a hot tub, or do anything that puts your wound underwater until your health care provider approves. Ask your health care provider if you may take showers. You may only be allowed to take sponge baths.  Keep all follow-up visits. This is important.   Contact a health care provider if:  You have redness, swelling, or pain around your wound.  You have more fluid or blood coming from your wound.  Your wound, or the area around your wound, feels warm to the touch.  You have pus or a bad smell coming from your wound. Get help right away if:  You have a red streak that goes away from the skin tear.  You have a fever and chills, and your symptoms suddenly get worse. Summary  A skin tear is a wound in which the top layers of skin have peeled off from the deeper skin or tissues underneath.  A skin tear is often repaired with tape or skin adhesive strips, and a bandage (dressing) may be   applied over the tape or the adhesive strips.  Change any dressings as told by your health care provider.  Take or apply over-the-counter and prescription medicines only as told by your health care provider.  Contact a health care provider if you have signs of infection. This information is not intended to replace advice given to you by your health care provider. Make sure you discuss any questions you have with your health care provider. Document Revised: 09/30/2019 Document Reviewed: 09/30/2019 Elsevier Patient Education  2021 [employer].  

## 2020-11-16 LAB — CMP14+EGFR
ALT: 15 IU/L (ref 0–44)
AST: 25 IU/L (ref 0–40)
Albumin/Globulin Ratio: 1.3 (ref 1.2–2.2)
Albumin: 3.7 g/dL (ref 3.7–4.7)
Alkaline Phosphatase: 109 IU/L (ref 44–121)
BUN/Creatinine Ratio: 15 (ref 10–24)
BUN: 11 mg/dL (ref 8–27)
Bilirubin Total: 0.7 mg/dL (ref 0.0–1.2)
CO2: 25 mmol/L (ref 20–29)
Calcium: 9.8 mg/dL (ref 8.6–10.2)
Chloride: 102 mmol/L (ref 96–106)
Creatinine, Ser: 0.71 mg/dL — ABNORMAL LOW (ref 0.76–1.27)
Globulin, Total: 2.8 g/dL (ref 1.5–4.5)
Glucose: 95 mg/dL (ref 65–99)
Potassium: 4.6 mmol/L (ref 3.5–5.2)
Sodium: 141 mmol/L (ref 134–144)
Total Protein: 6.5 g/dL (ref 6.0–8.5)
eGFR: 98 mL/min/{1.73_m2} (ref >59)

## 2020-11-16 LAB — CBC WITH DIFFERENTIAL/PLATELET
Basophils Absolute: 0.1 10*3/uL (ref 0.0–0.2)
Basos: 1 %
EOS (ABSOLUTE): 0.3 10*3/uL (ref 0.0–0.4)
Eos: 2 %
Hematocrit: 43.2 % (ref 37.5–51.0)
Hemoglobin: 15 g/dL (ref 13.0–17.7)
Immature Grans (Abs): 0 10*3/uL (ref 0.0–0.1)
Immature Granulocytes: 0 %
Lymphocytes Absolute: 3.4 10*3/uL — ABNORMAL HIGH (ref 0.7–3.1)
Lymphs: 30 %
MCH: 31.4 pg (ref 26.6–33.0)
MCHC: 34.7 g/dL (ref 31.5–35.7)
MCV: 91 fL (ref 79–97)
Monocytes Absolute: 1 10*3/uL — ABNORMAL HIGH (ref 0.1–0.9)
Monocytes: 9 %
Neutrophils Absolute: 6.5 10*3/uL (ref 1.4–7.0)
Neutrophils: 58 %
Platelets: 331 10*3/uL (ref 150–450)
RBC: 4.77 x10E6/uL (ref 4.14–5.80)
RDW: 12.8 % (ref 11.6–15.4)
WBC: 11.3 10*3/uL — ABNORMAL HIGH (ref 3.4–10.8)

## 2020-11-16 LAB — LIPID PANEL
Chol/HDL Ratio: 2.7 ratio (ref 0.0–5.0)
Cholesterol, Total: 200 mg/dL — ABNORMAL HIGH (ref 100–199)
HDL: 74 mg/dL (ref >39)
LDL Chol Calc (NIH): 114 mg/dL — ABNORMAL HIGH (ref 0–99)
Triglycerides: 66 mg/dL (ref 0–149)
VLDL Cholesterol Cal: 12 mg/dL (ref 5–40)

## 2020-11-16 LAB — HEPATITIS C ANTIBODY: Hep C Virus Ab: <0.1 s/co ratio (ref 0.0–0.9)

## 2020-11-16 NOTE — Telephone Encounter (Signed)
PA approved and pharmacy notified. 

## 2020-11-17 ENCOUNTER — Other Ambulatory Visit: Payer: Self-pay | Admitting: Family

## 2020-11-17 MED ORDER — ROSUVASTATIN CALCIUM 10 MG PO TABS
10.0000 mg | ORAL_TABLET | Freq: Every day | ORAL | 3 refills | Status: DC
Start: 2020-11-17 — End: 2021-11-30

## 2020-11-23 DIAGNOSIS — Z1211 Encounter for screening for malignant neoplasm of colon: Secondary | ICD-10-CM | POA: Diagnosis not present

## 2020-11-24 ENCOUNTER — Ambulatory Visit (INDEPENDENT_AMBULATORY_CARE_PROVIDER_SITE_OTHER): Payer: Medicare HMO

## 2020-11-24 VITALS — Ht 68.0 in | Wt 160.0 lb

## 2020-11-24 DIAGNOSIS — Z0000 Encounter for general adult medical examination without abnormal findings: Secondary | ICD-10-CM | POA: Diagnosis not present

## 2020-11-24 NOTE — Patient Instructions (Signed)
Mr. Victor Shields , Thank you for taking time to come for your Medicare Wellness Visit. I appreciate your ongoing commitment to your health goals. Please review the following plan we discussed and let me know if I can assist you in the future.   Screening recommendations/referrals: Colonoscopy: Cologuard recently returned - will call with results Recommended yearly ophthalmology/optometry visit for glaucoma screening and checkup Recommended yearly dental visit for hygiene and checkup  Vaccinations: Influenza vaccine: Due Pneumococcal vaccine: Done 11/15/2020 - due for second in a year Tdap vaccine: Due Shingles vaccine: Due   Covid-19: Due  Advanced directives: Advance directive discussed with you today. I have provided a copy for you to complete at home and have notarized. Once this is complete please bring a copy in to our office so we can scan it into your chart.  Conditions/risks identified: If you wish to quit smoking, help is available. For free tobacco cessation program offerings call the [facility] at 272 463 1883 or Live Well Line at 941-773-9765. You may also visit www.[place].com or email livelifewell@[employer] .com for more information on other programs.   You may also call 1-800-QUIT-NOW ((743) 069-6649) or visit www.[place].ch or www.[facility].org for additional resources on smoking cessation.  Aim for 30 minutes of exercise or brisk walking each day, drink 6-8 glasses of water and eat lots of fruits and vegetables.  Next appointment: Follow up in one year for your annual wellness visit.   Preventive Care 34 Years and Older, Male  Preventive care refers to lifestyle choices and visits with your health care provider that can promote health and wellness. What does preventive care include?  A yearly physical exam. This is also called an annual well check.  Dental exams once or twice a year.  Routine eye exams. Ask your health care provider how often you  should have your eyes checked.  Personal lifestyle choices, including:  Daily care of your teeth and gums.  Regular physical activity.  Eating a healthy diet.  Avoiding tobacco and drug use.  Limiting alcohol use.  Practicing safe sex.  Taking low doses of aspirin every day.  Taking vitamin and mineral supplements as recommended by your health care provider. What happens during an annual well check? The services and screenings done by your health care provider during your annual well check will depend on your age, overall health, lifestyle risk factors, and family history of disease. Counseling  Your health care provider may ask you questions about your:  Alcohol use.  Tobacco use.  Drug use.  Emotional well-being.  Home and relationship well-being.  Sexual activity.  Eating habits.  History of falls.  Memory and ability to understand (cognition).  Work and work Astronomer. Screening  You may have the following tests or measurements:  Height, weight, and BMI.  Blood pressure.  Lipid and cholesterol levels. These may be checked every 5 years, or more frequently if you are over 2 years old.  Skin check.  Lung cancer screening. You may have this screening every year starting at age 50 if you have a 30-pack-year history of smoking and currently smoke or have quit within the past 15 years.  Fecal occult blood test (FOBT) of the stool. You may have this test every year starting at age 52.  Flexible sigmoidoscopy or colonoscopy. You may have a sigmoidoscopy every 5 years or a colonoscopy every 10 years starting at age 56.  Prostate cancer screening. Recommendations will vary depending on your family history and other risks.  Hepatitis C blood test.  Hepatitis B blood test.  Sexually transmitted disease (STD) testing.  Diabetes screening. This is done by checking your blood sugar (glucose) after you have not eaten for a while (fasting). You may have this  done every 1-3 years.  Abdominal aortic aneurysm (AAA) screening. You may need this if you are a current or former smoker.  Osteoporosis. You may be screened starting at age 71 if you are at high risk. Talk with your health care provider about your test results, treatment options, and if necessary, the need for more tests. Vaccines  Your health care provider may recommend certain vaccines, such as:  Influenza vaccine. This is recommended every year.  Tetanus, diphtheria, and acellular pertussis (Tdap, Td) vaccine. You may need a Td booster every 10 years.  Zoster vaccine. You may need this after age 37.  Pneumococcal 13-valent conjugate (PCV13) vaccine. One dose is recommended after age 71.  Pneumococcal polysaccharide (PPSV23) vaccine. One dose is recommended after age 71. Talk to your health care provider about which screenings and vaccines you need and how often you need them. This information is not intended to replace advice given to you by your health care provider. Make sure you discuss any questions you have with your health care provider. Document Released: 07/22/2015 Document Revised: 03/14/2016 Document Reviewed: 04/26/2015 Elsevier Interactive Patient Education  2017 [employer].  Fall Prevention in the Home Falls can cause injuries. They can happen to people of all ages. There are many things you can do to make your home safe and to help prevent falls. What can I do on the outside of my home?  Regularly fix the edges of walkways and driveways and fix any cracks.  Remove anything that might make you trip as you walk through a door, such as a raised step or threshold.  Trim any bushes or trees on the path to your home.  Use bright outdoor lighting.  Clear any walking paths of anything that might make someone trip, such as rocks or tools.  Regularly check to see if handrails are loose or broken. Make sure that both sides of any steps have handrails.  Any raised  decks and porches should have guardrails on the edges.  Have any leaves, snow, or ice cleared regularly.  Use sand or salt on walking paths during winter.  Clean up any spills in your garage right away. This includes oil or grease spills. What can I do in the bathroom?  Use night lights.  Install grab bars by the toilet and in the tub and shower. Do not use towel bars as grab bars.  Use non-skid mats or decals in the tub or shower.  If you need to sit down in the shower, use a plastic, non-slip stool.  Keep the floor dry. Clean up any water that spills on the floor as soon as it happens.  Remove soap buildup in the tub or shower regularly.  Attach bath mats securely with double-sided non-slip rug tape.  Do not have throw rugs and other things on the floor that can make you trip. What can I do in the bedroom?  Use night lights.  Make sure that you have a light by your bed that is easy to reach.  Do not use any sheets or blankets that are too big for your bed. They should not hang down onto the floor.  Have a firm chair that has side arms. You can use this for support while you  get dressed.  Do not have throw rugs and other things on the floor that can make you trip. What can I do in the kitchen?  Clean up any spills right away.  Avoid walking on wet floors.  Keep items that you use a lot in easy-to-reach places.  If you need to reach something above you, use a strong step stool that has a grab bar.  Keep electrical cords out of the way.  Do not use floor polish or wax that makes floors slippery. If you must use wax, use non-skid floor wax.  Do not have throw rugs and other things on the floor that can make you trip. What can I do with my stairs?  Do not leave any items on the stairs.  Make sure that there are handrails on both sides of the stairs and use them. Fix handrails that are broken or loose. Make sure that handrails are as long as the stairways.  Check  any carpeting to make sure that it is firmly attached to the stairs. Fix any carpet that is loose or worn.  Avoid having throw rugs at the top or bottom of the stairs. If you do have throw rugs, attach them to the floor with carpet tape.  Make sure that you have a light switch at the top of the stairs and the bottom of the stairs. If you do not have them, ask someone to add them for you. What else can I do to help prevent falls?  Wear shoes that:  Do not have high heels.  Have rubber bottoms.  Are comfortable and fit you well.  Are closed at the toe. Do not wear sandals.  If you use a stepladder:  Make sure that it is fully opened. Do not climb a closed stepladder.  Make sure that both sides of the stepladder are locked into place.  Ask someone to hold it for you, if possible.  Clearly mark and make sure that you can see:  Any grab bars or handrails.  First and last steps.  Where the edge of each step is.  Use tools that help you move around (mobility aids) if they are needed. These include:  Canes.  Walkers.  Scooters.  Crutches.  Turn on the lights when you go into a dark area. Replace any light bulbs as soon as they burn out.  Set up your furniture so you have a clear path. Avoid moving your furniture around.  If any of your floors are uneven, fix them.  If there are any pets around you, be aware of where they are.  Review your medicines with your doctor. Some medicines can make you feel dizzy. This can increase your chance of falling. Ask your doctor what other things that you can do to help prevent falls. This information is not intended to replace advice given to you by your health care provider. Make sure you discuss any questions you have with your health care provider. Document Released: 04/21/2009 Document Revised: 12/01/2015 Document Reviewed: 07/30/2014 Elsevier Interactive Patient Education  2017 [employer].

## 2020-11-24 NOTE — Progress Notes (Signed)
Subjective:   Victor Shields is a 70 y.o. male who presents for Medicare Annual/Subsequent preventive examination.  Virtual Visit via Telephone Note  I connected with  Victor Shields on 11/24/20 at  2:45 PM EDT by telephone and verified that I am speaking with the correct person using two identifiers.  Location: Patient: Home Provider: [facility] Persons participating in the virtual visit: patient, his wife/Nurse Health Advisor   I discussed the limitations, risks, security and privacy concerns of performing an evaluation and management service by telephone and the availability of in person appointments. The patient expressed understanding and agreed to proceed.  Interactive audio and video telecommunications were attempted between this nurse and patient, however failed, due to patient having technical difficulties OR patient did not have access to video capability.  We continued and completed visit with audio only.  Some vital signs may be absent or patient reported.   Sheletha Bow E Antoria Lanza, LPN   Review of Systems     Cardiac Risk Factors include: advanced age (>65men, >34 women);sedentary lifestyle;smoking/ tobacco exposure;male gender;dyslipidemia     Objective:    Today's Vitals   11/24/20 1703  Weight: 160 lb (72.6 kg)  Height: 5\' 8"  (1.727 m)  PainSc: 4    Body mass index is 24.33 kg/m.  Advanced Directives 11/24/2020 02/15/2020 11/24/2019 11/13/2018 02/04/2018  Does Patient Have a Medical Advance Directive? No No No No No  Would patient like information on creating a medical advance directive? Yes (MAU/Ambulatory/Procedural Areas - Information given) No - Patient declined No - Patient declined Yes (MAU/Ambulatory/Procedural Areas - Information given) -    Current Medications (verified) Outpatient Encounter Medications as of 11/24/2020  Medication Sig  . albuterol (VENTOLIN HFA) 108 (90 Base) MCG/ACT inhaler Inhale 2 puffs into the lungs every 6 (six) hours as needed for  wheezing or shortness of breath.  . budesonide-formoterol (SYMBICORT) 160-4.5 MCG/ACT inhaler INHALE 2 PUFFS INTO THE LUNGS TWICE A DAY  . clotrimazole (LOTRIMIN) 1 % cream Apply 1 application topically as needed.  . nystatin (MYCOSTATIN/NYSTOP) powder Apply 1 application topically 3 (three) times daily.  . rosuvastatin (CRESTOR) 10 MG tablet Take 1 tablet (10 mg total) by mouth daily.  11/26/2020 trolamine salicylate (ASPERCREME) 10 % cream Apply 1 application topically as needed for muscle pain.  Marland Kitchen docusate sodium (COLACE) 100 MG capsule Take 1 capsule (100 mg total) by mouth 2 (two) times daily. (Patient not taking: Reported on 11/24/2020)  . feeding supplement, ENSURE ENLIVE, (ENSURE ENLIVE) LIQD Take 237 mLs by mouth 2 (two) times daily between meals. (Patient not taking: Reported on 11/24/2020)   No facility-administered encounter medications on file as of 11/24/2020.    Allergies (verified) Penicillins   History: Past Medical History:  Diagnosis Date  . Gout    Past Surgical History:  Procedure Laterality Date  . CATARACT EXTRACTION    . COMPRESSION HIP SCREW Left 02/17/2020   Procedure: OPEN TREATMENT INTERNAL FIXATION LEFT HIP;  Surgeon: 04/18/2020, MD;  Location: [facility];  Service: Orthopedics;  Laterality: Left;   History reviewed. No pertinent family history. Social History   Socioeconomic History  . Marital status: Married    Spouse name: Vickki Hearing  . Number of children: 1  . Years of education: Not on file  . Highest education level: 9th grade  Occupational History  . Occupation: Retired    [name]: [employer]  Tobacco Use  . Smoking status: Current Every Day Smoker    Packs/day: 1.00  Years: 40.00    Pack years: 40.00    Types: Cigarettes    Start date: 07/10/1975  . Smokeless tobacco: Never Used  Vaping Use  . Vaping Use: Never used  Substance and Sexual Activity  . Alcohol use: Yes    Alcohol/week: 24.0 standard drinks    Types: 24 Standard  drinks or equivalent per week    Comment: "couple of beers daily"  . Drug use: No  . Sexual activity: Not Currently  Other Topics Concern  . Not on file  Social History Narrative   Lives at home with wife - one level living   Social Determinants of Health   Financial Resource Strain: Low Risk   . Difficulty of Paying Living Expenses: Not hard at all  Food Insecurity: No Food Insecurity  . Worried About Programme researcher, broadcasting/film/video in the Last Year: Never true  . Ran Out of Food in the Last Year: Never true  Transportation Needs: No Transportation Needs  . Lack of Transportation (Medical): No  . Lack of Transportation (Non-Medical): No  Physical Activity: Insufficiently Active  . Days of Exercise per Week: 7 days  . Minutes of Exercise per Session: 10 min  Stress: No Stress Concern Present  . Feeling of Stress : Not at all  Social Connections: Moderately Integrated  . Frequency of Communication with Friends and Family: Three times a week  . Frequency of Social Gatherings with Friends and Family: Twice a week  . Attends Religious Services: Never  . Active Member of Clubs or Organizations: Yes  . Attends Banker Meetings: 1 to 4 times per year  . Marital Status: Married    Tobacco Counseling Ready to quit: Not Answered Counseling given: Not Answered   Clinical Intake:  Pre-visit preparation completed: Yes  Pain : 0-10 Pain Score: 4  Pain Type: Neuropathic pain Pain Location: Foot Pain Orientation: Right,Left Pain Descriptors / Indicators: Aching,Burning Pain Onset: More than a month ago Pain Frequency: Constant     BMI - recorded: 24.27 Nutritional Status: BMI of 19-24  Normal Nutritional Risks: None Diabetes: No  How often do you need to have someone help you when you read instructions, pamphlets, or other written materials from your doctor or pharmacy?: 1 - Never  Diabetic? No  Interpreter Needed?: No  Information entered by :: Keona Bilyeu,  LPN   Activities of Daily Living In your present state of health, do you have any difficulty performing the following activities: 11/24/2020 02/15/2020  Hearing? N N  Vision? N N  Difficulty concentrating or making decisions? N N  Walking or climbing stairs? N N  Dressing or bathing? N N  Doing errands, shopping? N N  Preparing Food and eating ? N -  Using the Toilet? N -  In the past six months, have you accidently leaked urine? N -  Do you have problems with loss of bowel control? N -  Managing your Medications? N -  Managing your Finances? N -  Housekeeping or managing your Housekeeping? N -  Some recent data might be hidden    Patient Care Team: Junie Spencer, FNP as PCP - General (Nurse Practitioner)  Indicate any recent Medical Services you may have received from other than [facility] providers in the past year (date may be approximate).     Assessment:   This is a routine wellness examination for Victor Shields.  Hearing/Vision screen  Hearing Screening   125Hz  250Hz  500Hz  1000Hz  2000Hz  3000Hz  4000Hz  6000Hz  8000Hz   Right ear:           Left ear:           Comments: C/o moderate hearing loss - declines hearing aids  Vision Screening Comments: Wears glasses - Routine visits with [facility] - up to date with eye exam  Dietary issues and exercise activities discussed: Current Exercise Habits: Home exercise routine, Type of exercise: walking, Time (Minutes): 10, Frequency (Times/Week): 7, Weekly Exercise (Minutes/Week): 70, Intensity: Mild, Exercise limited by: respiratory conditions(s)  Goals Addressed            This Visit's Progress   . DIET - EAT MORE FRUITS AND VEGETABLES   On track   . Exercise 150 min/wk Moderate Activity   Not on track   . Quit Smoking   On track    Down to 1/2 ppd      Depression Screen PHQ 2/9 Scores 11/24/2020 11/15/2020 11/24/2019 11/13/2018 03/20/2018 11/15/2017 12/25/2016  PHQ - 2 Score 0 0 0 0 0 0 0    Fall Risk Fall Risk  11/24/2020  11/15/2020 11/24/2019 11/13/2018 03/20/2018  Falls in the past year? 1 0 0 0 Yes  Number falls in past yr: 0 - - - 1  Injury with Fall? 1 - - - Yes  Risk for fall due to : Orthopedic patient;Impaired vision - - - -  Follow up Education provided;Falls prevention discussed - - - -    FALL RISK PREVENTION PERTAINING TO THE HOME:  Any stairs in or around the home? No  If so, are there any without handrails? No  Home free of loose throw rugs in walkways, pet beds, electrical cords, etc? Yes  Adequate lighting in your home to reduce risk of falls? Yes   ASSISTIVE DEVICES UTILIZED TO PREVENT FALLS:  Life alert? No  Use of a cane, walker or w/c? Yes  Grab bars in the bathroom? Yes  Shower chair or bench in shower? Yes  Elevated toilet seat or a handicapped toilet? Yes   TIMED UP AND GO:  Was the test performed? No . Telephonic visit.  Cognitive Function: Normal cognitive status assessed by direct observation by this Nurse Health Advisor. No abnormalities found.      6CIT Screen 11/24/2019 11/13/2018  What Year? 0 points 0 points  What month? 0 points 0 points  What time? 0 points 0 points  Count back from 20 0 points 0 points  Months in reverse 2 points 0 points  Repeat phrase 2 points 0 points  Total Score 4 0    Immunizations Immunization History  Administered Date(s) Administered  . Pneumococcal Conjugate-13 11/15/2020    TDAP status: Due, Education has been provided regarding the importance of this vaccine. Advised may receive this vaccine at local pharmacy or Health Dept. Aware to provide a copy of the vaccination record if obtained from local pharmacy or Health Dept. Verbalized acceptance and understanding.  Flu Vaccine status: Due, Education has been provided regarding the importance of this vaccine. Advised may receive this vaccine at local pharmacy or Health Dept. Aware to provide a copy of the vaccination record if obtained from local pharmacy or Health Dept. Verbalized  acceptance and understanding.  Pneumococcal vaccine status: Up to date  Covid-19 vaccine status: Information provided on how to obtain vaccines.   Qualifies for Shingles Vaccine? Yes   Zostavax completed No   Shingrix Completed?: No.    Education has been provided regarding the importance of this vaccine. Patient has been advised  to call insurance company to determine out of pocket expense if they have not yet received this vaccine. Advised may also receive vaccine at local pharmacy or Health Dept. Verbalized acceptance and understanding.  Screening Tests Health Maintenance  Topic Date Due  . Fecal DNA (Cologuard)  Never done  . COVID-19 Vaccine (1) 12/01/2020 (Originally 10/04/1955)  . TETANUS/TDAP  11/15/2021 (Originally 10/03/1969)  . INFLUENZA VACCINE  02/06/2021  . PNA vac Low Risk Adult (2 of 2 - PPSV23) 11/15/2021  . Hepatitis C Screening  Completed  . HPV VACCINES  Aged Out    Health Maintenance  Health Maintenance Due  Topic Date Due  . Fecal DNA (Cologuard)  Never done    Colorectal cancer screening: Type of screening: Cologuard. Completed 11/2020. Repeat every 3 years  Lung Cancer Screening: (Low Dose CT Chest recommended if Age 62-80 years, 30 pack-year currently smoking OR have quit w/in 15years.) does qualify.   Lung Cancer Screening Referral: declines  Additional Screening:  Hepatitis C Screening: does qualify; Completed 11/15/2020  Vision Screening: Recommended annual ophthalmology exams for early detection of glaucoma and other disorders of the eye. Is the patient up to date with their annual eye exam?  Yes  Who is the provider or what is the name of the office in which the patient attends annual eye exams? [facility] If pt is not established with a provider, would they like to be referred to a provider to establish care? No .   Dental Screening: Recommended annual dental exams for proper oral hygiene  Community Resource Referral / Chronic Care  Management: CRR required this visit?  No   CCM required this visit?  No      Plan:     I have personally reviewed and noted the following in the patient's chart:   . Medical and social history . Use of alcohol, tobacco or illicit drugs  . Current medications and supplements including opioid prescriptions. Patient is not currently taking opioid prescriptions. . Functional ability and status . Nutritional status . Physical activity . Advanced directives . List of other physicians . Hospitalizations, surgeries, and ER visits in previous 12 months . Vitals . Screenings to include cognitive, depression, and falls . Referrals and appointments  In addition, I have reviewed and discussed with patient certain preventive protocols, quality metrics, and best practice recommendations. A written personalized care plan for preventive services as well as general preventive health recommendations were provided to patient.     [place] Constablemy E Kaida Games, LPN   7/82/95625/19/2022   Nurse Notes: None

## 2020-12-04 LAB — COLOGUARD: COLOGUARD: NEGATIVE

## 2020-12-08 ENCOUNTER — Other Ambulatory Visit: Payer: Self-pay | Admitting: Family

## 2020-12-08 DIAGNOSIS — J42 Unspecified chronic bronchitis: Secondary | ICD-10-CM

## 2020-12-08 LAB — COLOGUARD: Cologuard: NEGATIVE

## 2021-04-16 ENCOUNTER — Other Ambulatory Visit: Payer: Self-pay | Admitting: Family

## 2021-05-19 ENCOUNTER — Encounter: Payer: Self-pay | Admitting: Family

## 2021-05-19 ENCOUNTER — Other Ambulatory Visit: Payer: Self-pay

## 2021-05-19 ENCOUNTER — Ambulatory Visit (INDEPENDENT_AMBULATORY_CARE_PROVIDER_SITE_OTHER): Payer: Medicare HMO | Admitting: Family

## 2021-05-19 VITALS — BP 134/64 | HR 86 | Temp 98.2°F | Ht 68.0 in | Wt 161.4 lb

## 2021-05-19 DIAGNOSIS — R82998 Other abnormal findings in urine: Secondary | ICD-10-CM

## 2021-05-19 DIAGNOSIS — E785 Hyperlipidemia, unspecified: Secondary | ICD-10-CM

## 2021-05-19 DIAGNOSIS — R252 Cramp and spasm: Secondary | ICD-10-CM

## 2021-05-19 DIAGNOSIS — Z720 Tobacco use: Secondary | ICD-10-CM

## 2021-05-19 DIAGNOSIS — Z23 Encounter for immunization: Secondary | ICD-10-CM | POA: Diagnosis not present

## 2021-05-19 DIAGNOSIS — J438 Other emphysema: Secondary | ICD-10-CM

## 2021-05-19 LAB — URINALYSIS, COMPLETE
Bilirubin, UA: NEGATIVE
Glucose, UA: NEGATIVE
Leukocytes,UA: NEGATIVE
Nitrite, UA: NEGATIVE
RBC, UA: NEGATIVE
Specific Gravity, UA: 1.025 (ref 1.005–1.030)
Urobilinogen, Ur: 1 mg/dL (ref 0.2–1.0)
pH, UA: 5.5 (ref 5.0–7.5)

## 2021-05-19 LAB — MICROSCOPIC EXAMINATION: Renal Epithel, UA: NONE SEEN /hpf

## 2021-05-19 MED ORDER — BACLOFEN 10 MG PO TABS: 10.0000 mg | ORAL_TABLET | Freq: Three times a day (TID) | ORAL | 0 refills | Status: DC

## 2021-05-19 NOTE — Addendum Note (Signed)
Addended by: Austin Miles F on: 05/19/2021 02:57 PM   Modules accepted: Orders

## 2021-05-19 NOTE — Progress Notes (Signed)
Subjective:    Patient ID: Victor Shields, male    DOB: Sep 29, 1950, 70 y.o.   MRN: [id number]  Chief Complaint  Patient presents with   Medical Management of Chronic Issues    Bilateral foot pain    PT presents to the office today for chronic follow up. He has a hx of left hip fracture in 02/2020.Using rolling walker.    He is complaining of bilateral feet cramps that come and goes. He reports this pain is a 10 out 10 when it happens,but only lasts for a few minutes.  Shortness of Breath This is a chronic problem. The current episode started more than 1 year ago. The problem occurs every few minutes. The problem has been waxing and waning.  Nicotine Dependence Presents for follow-up visit. His urge triggers include company of smokers. The symptoms have been stable. He smokes < 1/2 a pack of cigarettes per day.  Hyperlipidemia This is a chronic problem. The current episode started more than 1 year ago. The problem is controlled. Exacerbating diseases include obesity. Associated symptoms include shortness of breath. Current antihyperlipidemic treatment includes statins. The current treatment provides moderate improvement of lipids.  COPD Continues to smoke 1/2 pack a day. Has intermittent SOB. Using Symbicort most days and albuterol as needed.    Review of Systems  Respiratory:  Positive for shortness of breath.   All other systems reviewed and are negative.     Objective:   Physical Exam Vitals reviewed.  Constitutional:      General: He is not in acute distress.    Appearance: He is well-developed.  HENT:     Head: Normocephalic.     Right Ear: Tympanic membrane normal.     Left Ear: Tympanic membrane normal. There is impacted cerumen.  Eyes:     General:        Right eye: No discharge.        Left eye: No discharge.     Pupils: Pupils are equal, round, and reactive to light.  Neck:     Thyroid: No thyromegaly.  Cardiovascular:     Rate and Rhythm: Normal rate and  regular rhythm.     Heart sounds: Normal heart sounds. No murmur heard. Pulmonary:     Effort: Pulmonary effort is normal. No respiratory distress.     Breath sounds: Normal breath sounds. No wheezing.  Abdominal:     General: Bowel sounds are normal. There is no distension.     Palpations: Abdomen is soft.     Tenderness: There is no abdominal tenderness.  Musculoskeletal:        General: No tenderness. Normal range of motion.     Cervical back: Normal range of motion and neck supple.  Skin:    General: Skin is warm and dry.     Findings: Bruising present. No erythema or rash.  Neurological:     Mental Status: He is alert and oriented to person, place, and time.     Cranial Nerves: No cranial nerve deficit.     Deep Tendon Reflexes: Reflexes are normal and symmetric.  Psychiatric:        Behavior: Behavior normal.        Thought Content: Thought content normal.        Judgment: Judgment normal.    BP 134/64   Pulse 86   Temp 98.2 F (36.8 C) (Temporal)   Ht _0  (1.727 m)   Wt 161 lb 6.4 oz (73.2 kg)  BMI 24.54 kg/m        Assessment & Plan:  Victor Shields comes in today with chief complaint of Medical Management of Chronic Issues (Bilateral foot pain )   Diagnosis and orders addressed:  1. Other emphysema ([place]) - CMP14+EGFR - CBC with Differential/Platelet  2. Tobacco abuse - CMP14+EGFR - CBC with Differential/Platelet  3. Hyperlipidemia, unspecified hyperlipidemia type - CMP14+EGFR - CBC with Differential/Platelet  4. Cramping of feet Start Baclofen TID prn - baclofen (LIORESAL) 10 MG tablet; Take 1 tablet (10 mg total) by mouth 3 (three) times daily.  Dispense: 30 each; Refill: 0 - CMP14+EGFR - CBC with Differential/Platelet - Vitamin B12   Labs pending Health Maintenance reviewed Diet and exercise encouraged  Follow up plan: 6 months    Evelina Dun, FNP

## 2021-05-19 NOTE — Patient Instructions (Signed)
Leg Cramps Leg cramps occur when one or more muscles tighten and a person has no control over it (involuntary muscle contraction). Muscle cramps are most common in the calf muscles of the leg. They can occur during exercise or at rest. Leg cramps are painful, and they may last for a few seconds to a few minutes. Cramps may return several times before they finallystop. Usually, leg cramps are not caused by a serious medical problem. In many cases, the cause is not known. Some common causes include: Excessive physical effort (overexertion), such as during intense exercise. Doing the same motion over and over. Staying in a certain position for a long period of time. Improper preparation, form, or technique while doing a sport or an activity. Dehydration. Injury. Side effects of certain medicines. Abnormally low levels of minerals in your blood (electrolytes), especially potassium and calcium. This could result from: Pregnancy. Taking diuretic medicines. Follow these instructions at home: Eating and drinking Drink enough fluid to keep your urine pale yellow. Staying hydrated may help prevent cramps. Eat a healthy diet that includes plenty of nutrients to help your muscles function. A healthy diet includes fruits and vegetables, lean protein, whole grains, and low-fat or nonfat dairy products. Managing pain, stiffness, and swelling     Try massaging, stretching, and relaxing the affected muscle. Do this for several minutes at a time. If directed, put ice on areas that are sore or painful after a cramp. To do this: Put ice in a plastic bag. Place a towel between your skin and the bag. Leave the ice on for 20 minutes, 2-3 times a day. Remove the ice if your skin turns bright red. This is very important. If you cannot feel pain, heat, or cold, you have a greater risk of damage to the area. If directed, apply heat to muscles that are tense or tight. Do this before you exercise, or as often as told  by your health care provider. Use the heat source that your health care provider recommends, such as a moist heat pack or a heating pad. To do this: Place a towel between your skin and the heat source. Leave the heat on for 20-30 minutes. Remove the heat if your skin turns bright red. This is especially important if you are unable to feel pain, heat, or cold. You may have a greater risk of getting burned. Try taking hot showers or baths to help relax tight muscles. General instructions If you are having frequent leg cramps, avoid intense exercise for several days. Take over-the-counter and prescription medicines only as told by your health care provider. Keep all follow-up visits. This is important. Contact a health care provider if: Your leg cramps get more severe or more frequent, or they do not improve over time. Your foot becomes cold, numb, or blue. Summary Muscle cramps can develop in any muscle, but the most common place is in the calf muscles of the leg. Leg cramps are painful, and they may last for a few seconds to a few minutes. Usually, leg cramps are not caused by a serious medical problem. Often, the cause is not known. Stay hydrated, and take over-the-counter and prescription medicines only as told by your health care provider. This information is not intended to replace advice given to you by your health care provider. Make sure you discuss any questions you have with your healthcare provider. Document Revised: 11/11/2019 Document Reviewed: 11/11/2019 Elsevier Patient Education  2022 [employer]  

## 2021-05-20 LAB — CBC WITH DIFFERENTIAL/PLATELET
Basophils Absolute: 0.1 10*3/uL (ref 0.0–0.2)
Basos: 1 %
EOS (ABSOLUTE): 0.3 10*3/uL (ref 0.0–0.4)
Eos: 3 %
Hematocrit: 48.5 % (ref 37.5–51.0)
Hemoglobin: 16.6 g/dL (ref 13.0–17.7)
Immature Grans (Abs): 0 10*3/uL (ref 0.0–0.1)
Immature Granulocytes: 0 %
Lymphocytes Absolute: 3.1 10*3/uL (ref 0.7–3.1)
Lymphs: 28 %
MCH: 31.1 pg (ref 26.6–33.0)
MCHC: 34.2 g/dL (ref 31.5–35.7)
MCV: 91 fL (ref 79–97)
Monocytes Absolute: 1.1 10*3/uL — ABNORMAL HIGH (ref 0.1–0.9)
Monocytes: 10 %
Neutrophils Absolute: 6.3 10*3/uL (ref 1.4–7.0)
Neutrophils: 58 %
Platelets: 297 10*3/uL (ref 150–450)
RBC: 5.33 x10E6/uL (ref 4.14–5.80)
RDW: 12.5 % (ref 11.6–15.4)
WBC: 10.9 10*3/uL — ABNORMAL HIGH (ref 3.4–10.8)

## 2021-05-20 LAB — VITAMIN B12: Vitamin B-12: 656 pg/mL (ref 232–1245)

## 2021-05-20 LAB — CMP14+EGFR
ALT: 17 IU/L (ref 0–44)
AST: 33 IU/L (ref 0–40)
Albumin/Globulin Ratio: 1.4 (ref 1.2–2.2)
Albumin: 3.8 g/dL (ref 3.7–4.7)
Alkaline Phosphatase: 123 IU/L — ABNORMAL HIGH (ref 44–121)
BUN/Creatinine Ratio: 16 (ref 10–24)
BUN: 11 mg/dL (ref 8–27)
Bilirubin Total: 1 mg/dL (ref 0.0–1.2)
CO2: 23 mmol/L (ref 20–29)
Calcium: 9.7 mg/dL (ref 8.6–10.2)
Chloride: 102 mmol/L (ref 96–106)
Creatinine, Ser: 0.7 mg/dL — ABNORMAL LOW (ref 0.76–1.27)
Globulin, Total: 2.8 g/dL (ref 1.5–4.5)
Glucose: 91 mg/dL (ref 70–99)
Potassium: 4.8 mmol/L (ref 3.5–5.2)
Sodium: 142 mmol/L (ref 134–144)
Total Protein: 6.6 g/dL (ref 6.0–8.5)
eGFR: 99 mL/min/{1.73_m2} (ref >59)

## 2021-05-24 IMAGING — DX DG CHEST 1V PORT
1 series · 1 of 1 positions shown · non-contrast
Comparison: None.

CLINICAL DATA: Preop hip fracture.

EXAM:
PORTABLE CHEST 1 VIEW

[chest ap]
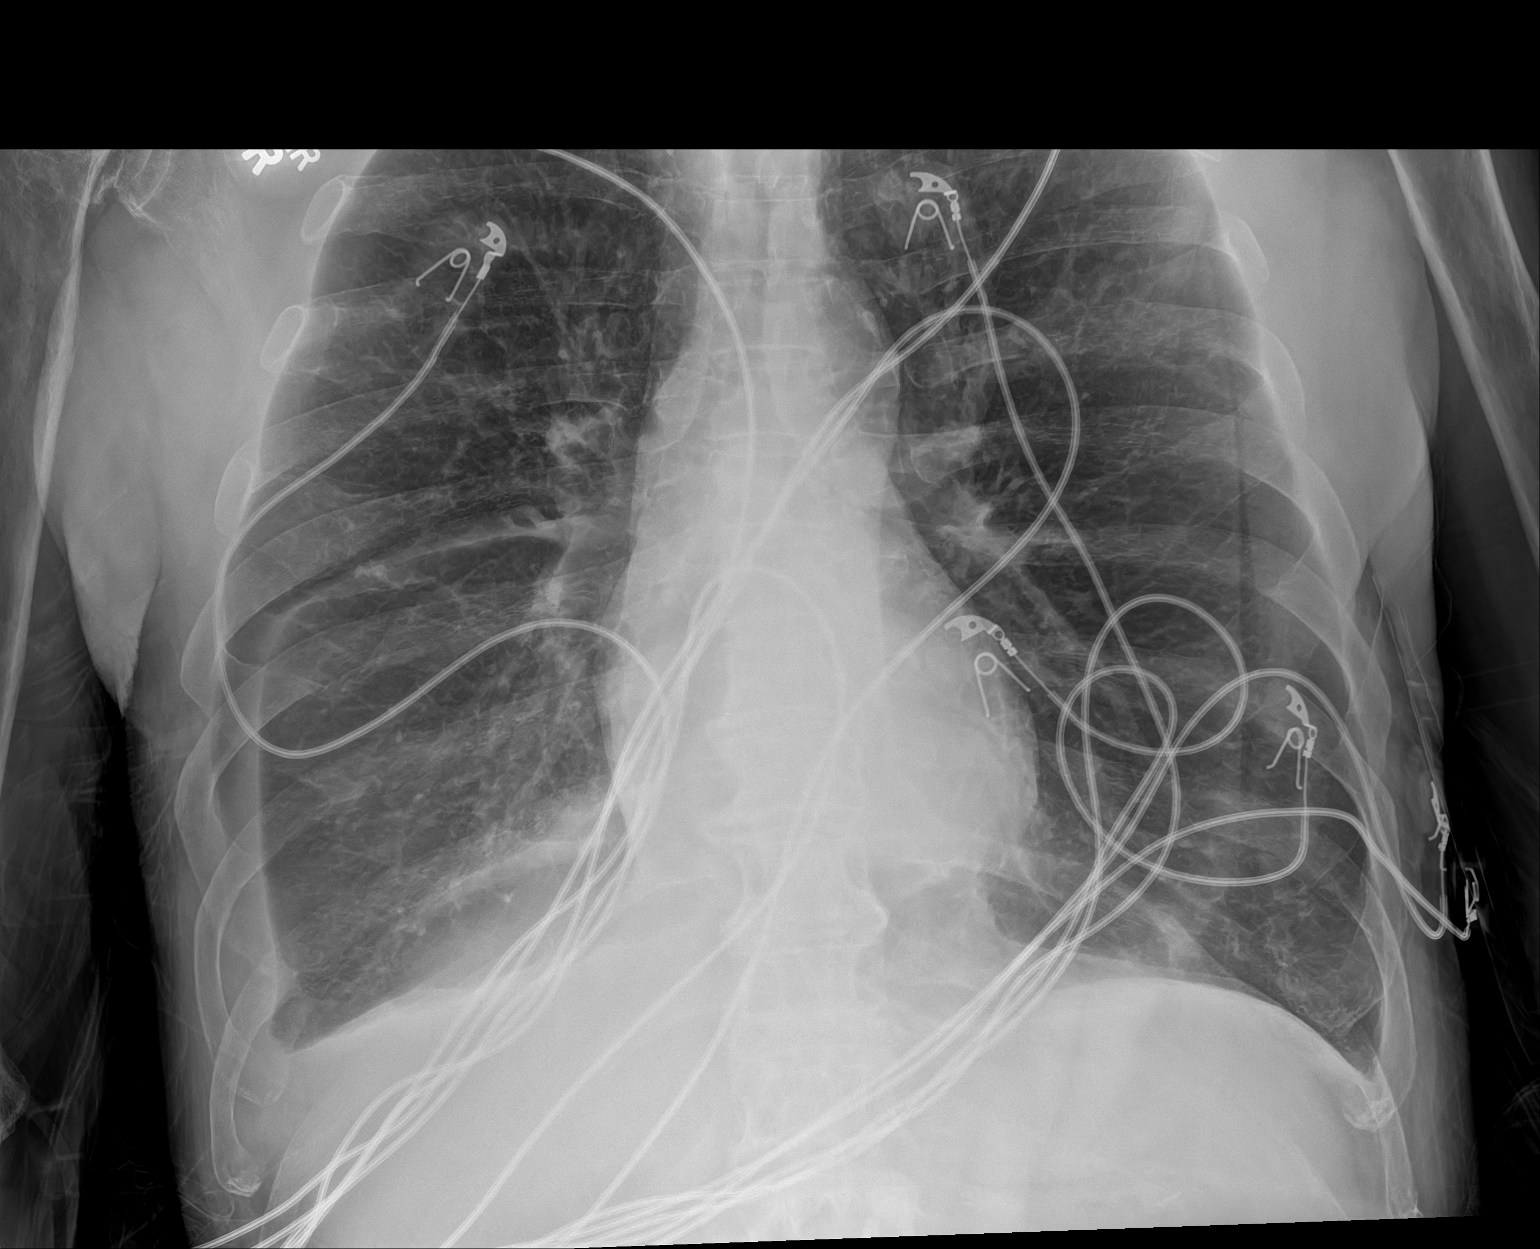

[1 of 1 positions shown; findings below may reference images not displayed]

FINDINGS: Trachea is midline. Heart size normal. Thoracic aorta is calcified.
Lungs are hyperinflated. Small right pleural effusion or pleural
thickening. Lungs are otherwise grossly clear.
IMPRESSION: 1. Small right pleural effusion or pleural thickening.
2. Hyperinflation.
3.  Aortic atherosclerosis ([id number]).

## 2021-05-28 IMAGING — US US EXTREM  UP VENOUS BILAT
1 series · 13 of 24 positions shown · non-contrast
Comparison: None.

CLINICAL DATA: Left upper extremity edema

EXAM:
BILATERAL UPPER EXTREMITY VENOUS DOPPLER ULTRASOUND
TECHNIQUE: Gray-scale sonography with graded compression, as well as color
Doppler and duplex ultrasound were performed to evaluate the
bilateral upper extremity deep venous systems from the level of the
subclavian vein and including the jugular, axillary, basilic,
radial, ulnar and upper cephalic vein. Spectral Doppler was utilized
to evaluate flow at rest and with distal augmentation maneuvers.

[Series 1: us venous img upper bilat (dvt) · portal-venous · 13 of 72 slices shown]
[im 1/72]
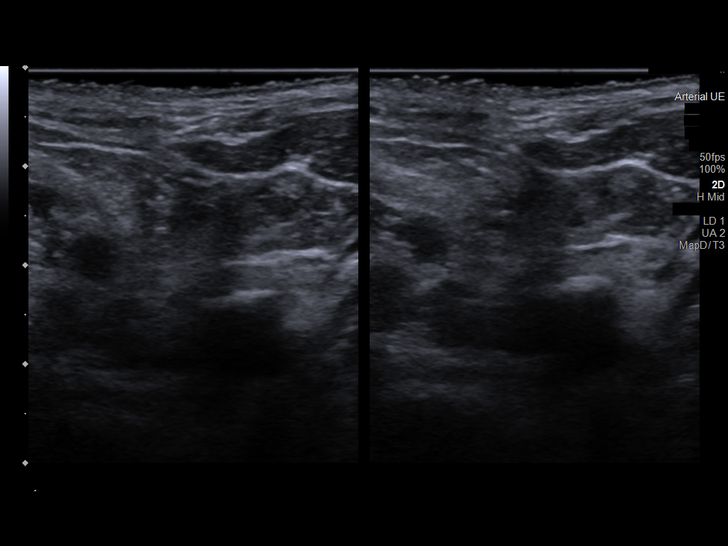
[im 7/72]
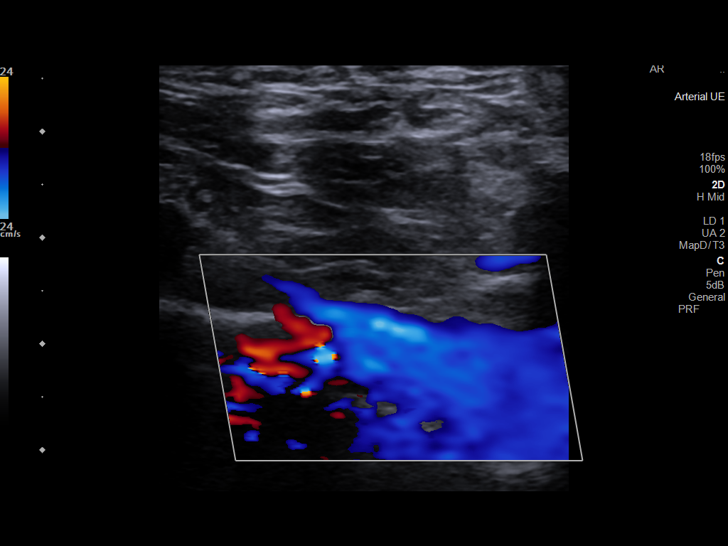
[im 13/72]
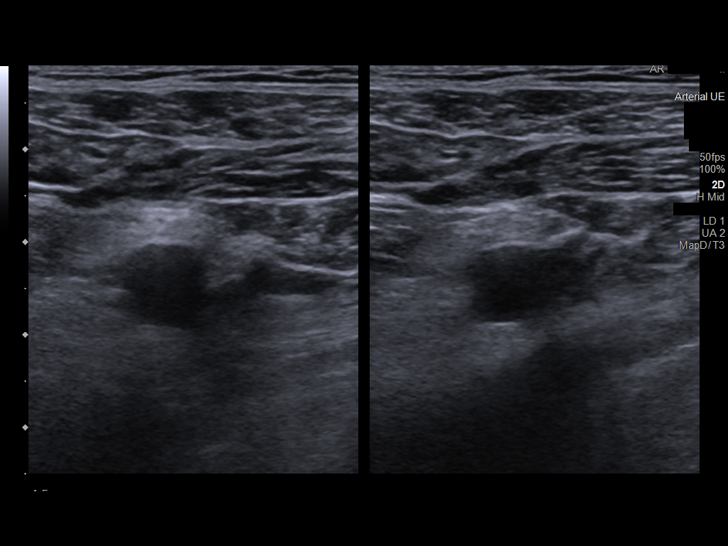
[im 19/72]
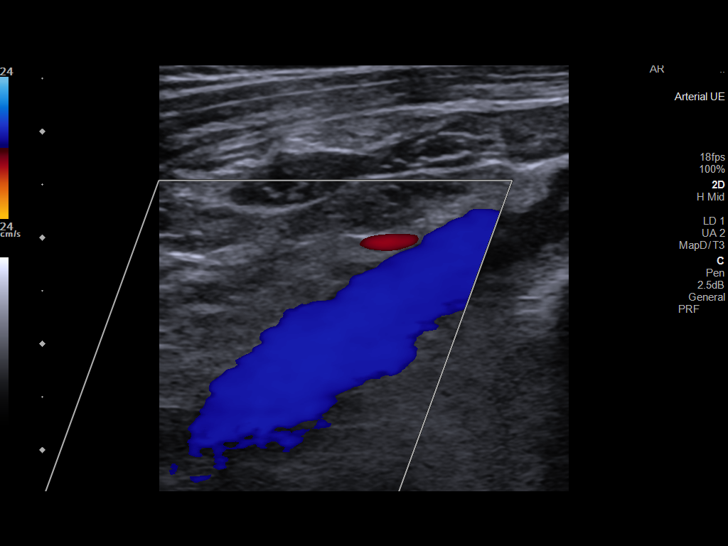
[im 25/72]
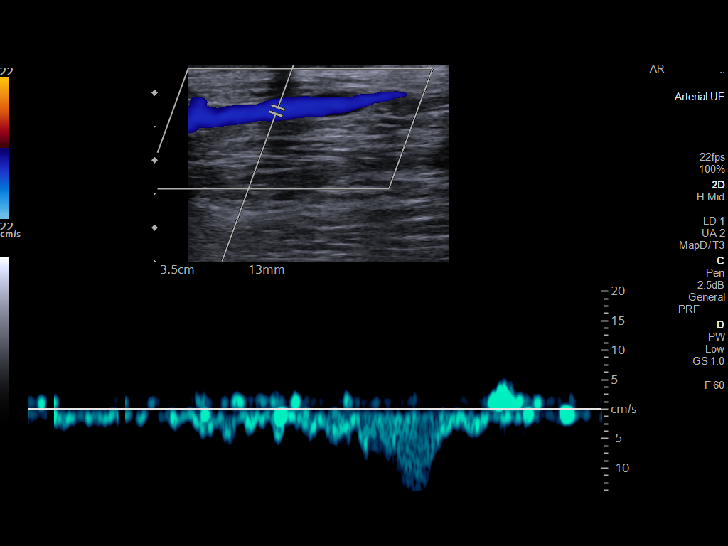
[im 31/72]
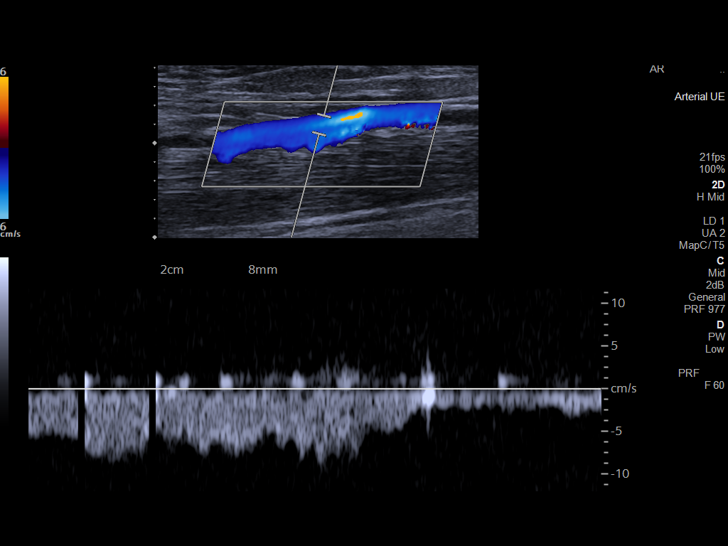
[im 38/72]
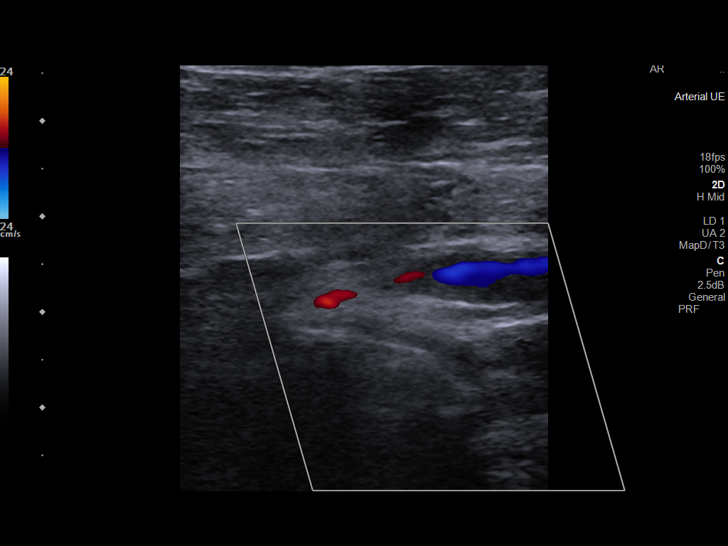
[im 41/72]
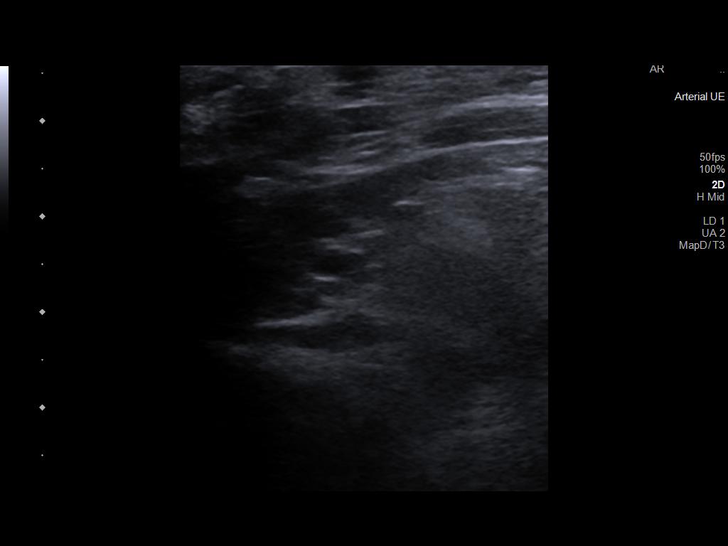
[im 47/72]
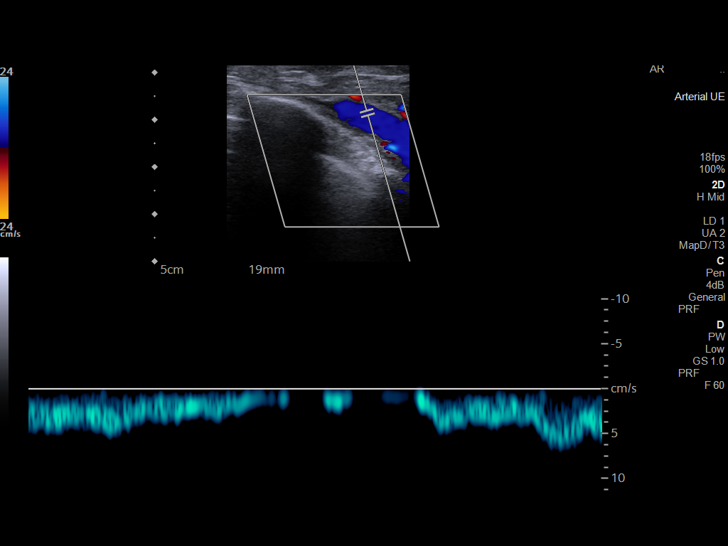
[im 53/72]
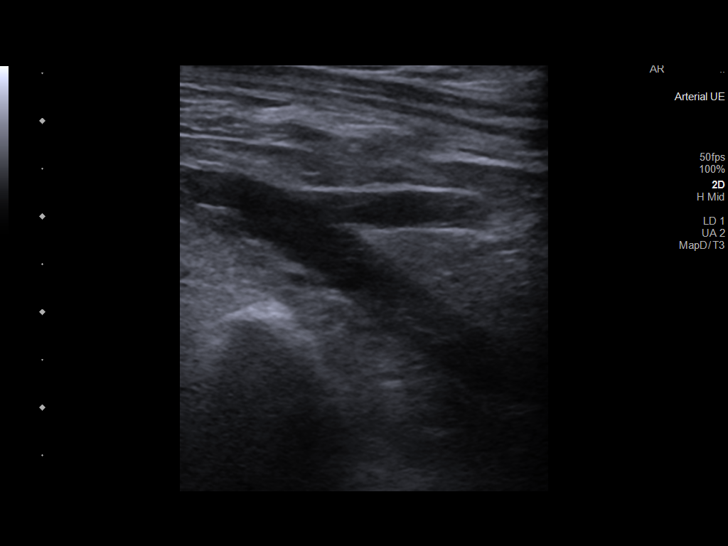
[im 59/72]
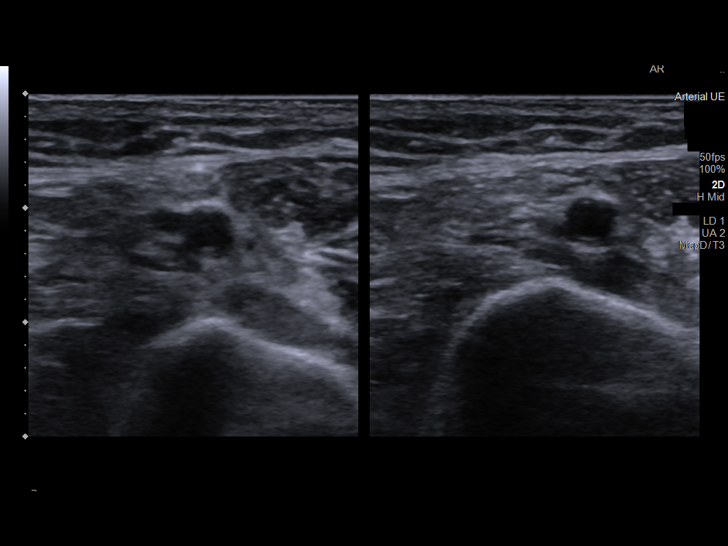
[im 65/72]
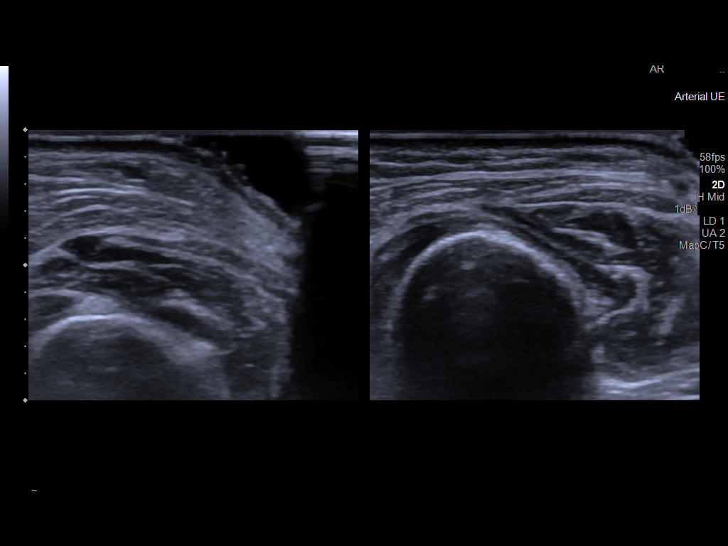
[im 72/72]
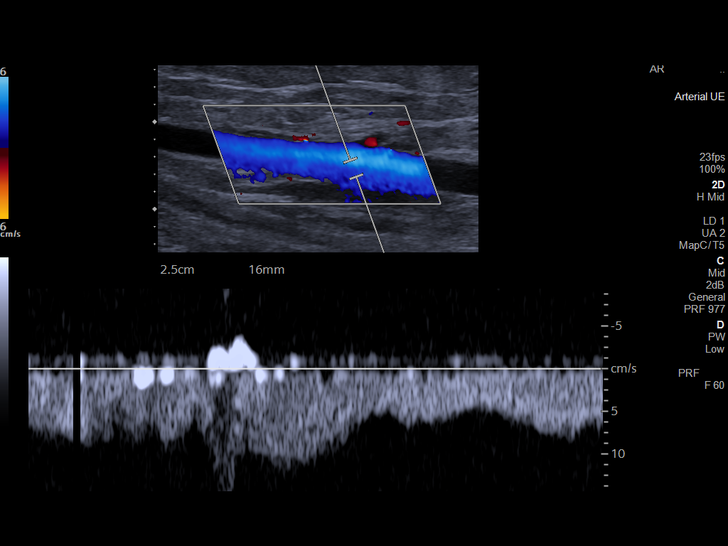

[13 of 24 positions shown; findings below may reference images not displayed]

FINDINGS: RIGHT UPPER EXTREMITY

Internal Jugular Vein: No evidence of thrombus. Normal
compressibility, respiratory phasicity and response to augmentation.

Subclavian Vein: No evidence of thrombus. Normal compressibility,
respiratory phasicity and response to augmentation.

Axillary Vein: No evidence of thrombus. Normal compressibility,
respiratory phasicity and response to augmentation.

Cephalic Vein: No evidence of thrombus. Normal compressibility,
respiratory phasicity and response to augmentation.

Basilic Vein: No evidence of thrombus. Normal compressibility,
respiratory phasicity and response to augmentation.

Brachial Veins: No evidence of thrombus. Normal compressibility,
respiratory phasicity and response to augmentation.

Radial Veins: No evidence of thrombus. Normal compressibility,
respiratory phasicity and response to augmentation.

Ulnar Veins: No evidence of thrombus. Normal compressibility,
respiratory phasicity and response to augmentation.

Venous Reflux:  None.

Other Findings:  None.

LEFT UPPER EXTREMITY

Internal Jugular Vein: No evidence of thrombus. Normal
compressibility, respiratory phasicity and response to augmentation.

Subclavian Vein: No evidence of thrombus. Normal compressibility,
respiratory phasicity and response to augmentation.

Axillary Vein: No evidence of thrombus. Normal compressibility,
respiratory phasicity and response to augmentation.

Cephalic Vein: No evidence of thrombus. Normal compressibility,
respiratory phasicity and response to augmentation.

Basilic Vein: No evidence of thrombus. Normal compressibility,
respiratory phasicity and response to augmentation.

Brachial Veins: No evidence of thrombus. Normal compressibility,
respiratory phasicity and response to augmentation.

Radial Veins: No evidence of thrombus. Normal compressibility,
respiratory phasicity and response to augmentation.

Ulnar Veins: No evidence of thrombus. Normal compressibility,
respiratory phasicity and response to augmentation.

Venous Reflux:  None.

Other Findings:  Subcutaneous edema is noted.
IMPRESSION: No evidence of DVT within either upper extremity.

## 2021-08-08 ENCOUNTER — Ambulatory Visit (INDEPENDENT_AMBULATORY_CARE_PROVIDER_SITE_OTHER): Payer: Medicare HMO | Admitting: Family

## 2021-08-08 ENCOUNTER — Encounter: Payer: Self-pay | Admitting: Family

## 2021-08-08 DIAGNOSIS — H938X3 Other specified disorders of ear, bilateral: Secondary | ICD-10-CM | POA: Diagnosis not present

## 2021-08-08 DIAGNOSIS — R42 Dizziness and giddiness: Secondary | ICD-10-CM

## 2021-08-08 DIAGNOSIS — H9190 Unspecified hearing loss, unspecified ear: Secondary | ICD-10-CM | POA: Diagnosis not present

## 2021-08-08 DIAGNOSIS — H65193 Other acute nonsuppurative otitis media, bilateral: Secondary | ICD-10-CM | POA: Diagnosis not present

## 2021-08-08 MED ORDER — FLUTICASONE PROPIONATE 50 MCG/ACT NA SUSP: 2.0000 | Freq: Every day | NASAL | 6 refills | Status: DC

## 2021-08-08 MED ORDER — CEFDINIR 300 MG PO CAPS: 300.0000 mg | ORAL_CAPSULE | Freq: Two times a day (BID) | ORAL | 0 refills | Status: DC

## 2021-08-08 NOTE — Progress Notes (Signed)
Virtual Visit  Note Due to COVID-19 pandemic this visit was conducted virtually. This visit type was conducted due to national recommendations for restrictions regarding the COVID-19 Pandemic (e.g. social distancing, sheltering in place) in an effort to limit this patient's exposure and mitigate transmission in our community. All issues noted in this document were discussed and addressed.  A physical exam was not performed with this format.  I connected with Victor Shields on 08/08/21 at 3:31 by telephone and verified that I am speaking with the correct person using two identifiers. Victor Shields is currently located at home and wife is currently with him during visit. The provider, Jannifer Rodney, FNP is located in their office at time of visit.  I discussed the limitations, risks, security and privacy concerns of performing an evaluation and management service by telephone and the availability of in person appointments. I also discussed with the patient that there may be a patient responsible charge related to this service. The patient expressed understanding and agreed to proceed.  Victor Shields, Victor Shields are scheduled for a virtual visit with your provider today.    Just as we do with appointments in the office, we must obtain your consent to participate.  Your consent will be active for this visit and any virtual visit you may have with one of our providers in the next 365 days.    If you have a MyChart account, I can also send a copy of this consent to you electronically.  All virtual visits are billed to your insurance company just like a traditional visit in the office.  As this is a virtual visit, video technology does not allow for your provider to perform a traditional examination.  This may limit your provider's ability to fully assess your condition.  If your provider identifies any concerns that need to be evaluated in person or the need to arrange testing such as labs, EKG, etc, we will  make arrangements to do so.    Although advances in technology are sophisticated, we cannot ensure that it will always work on either your end or our end.  If the connection with a video visit is poor, we may have to switch to a telephone visit.  With either a video or telephone visit, we are not always able to ensure that we have a secure connection.   I need to obtain your verbal consent now.   Are you willing to proceed with your visit today?   Victor Shields has provided verbal consent on 08/08/2021 for a virtual visit (video or telephone).   Jannifer Rodney, [place] 08/08/2021  3:28 PM    History and Present Illness:   Pt calls the office today with ear pain for a week.  Otalgia  There is pain in both ears. This is a new problem. The current episode started 1 to 4 weeks ago. The problem has been gradually worsening. There has been no fever. The pain is at a severity of 5/10. The pain is mild. Associated symptoms include hearing loss. Pertinent negatives include no coughing, diarrhea, ear discharge, headaches, rhinorrhea or sore throat. Associated symptoms comments: dizziness. He has tried acetaminophen for the symptoms. The treatment provided mild relief.    Review of Systems  HENT:  Positive for ear pain and hearing loss. Negative for ear discharge, rhinorrhea and sore throat.   Respiratory:  Negative for cough.   Gastrointestinal:  Negative for diarrhea.  Neurological:  Negative for headaches.  All other systems reviewed  and are negative.   Observations/Objective: No SOB or distress noted, HOH. Wife and I having to repeat ourselves.   Assessment and Plan: 1. Other acute nonsuppurative otitis media of both ears, recurrence not specified - cefdinir (OMNICEF) 300 MG capsule; Take 1 capsule (300 mg total) by mouth 2 (two) times daily. 1 po BID  Dispense: 20 capsule; Refill: 0 - fluticasone (FLONASE) 50 MCG/ACT nasal spray; Place 2 sprays into both nostrils daily.  Dispense: 16 g;  Refill: 6  2. Dizziness  3. Hearing loss, unspecified hearing loss type, unspecified laterality   4. Sensation of fullness in both ears  Start Augmentin Start Zyrtec, flonase, mucinex and Augmentin Force fluids  Follow up if symptoms worsen or do not improve      I discussed the assessment and treatment plan with the patient. The patient was provided an opportunity to ask questions and all were answered. The patient agreed with the plan and demonstrated an understanding of the instructions.   The patient was advised to call back or seek an in-person evaluation if the symptoms worsen or if the condition fails to improve as anticipated.  The above assessment and management plan was discussed with the patient. The patient verbalized understanding of and has agreed to the management plan. Patient is aware to call the clinic if symptoms persist or worsen. Patient is aware when to return to the clinic for a follow-up visit. Patient educated on when it is appropriate to go to the emergency department.   Time call ended:  3:43 pm   I provided 12 minutes of  non face-to-face time during this encounter.    Jannifer Rodney, FNP

## 2021-08-18 ENCOUNTER — Encounter: Payer: Self-pay | Admitting: Nurse Practitioner

## 2021-08-18 ENCOUNTER — Ambulatory Visit (INDEPENDENT_AMBULATORY_CARE_PROVIDER_SITE_OTHER): Payer: Medicare HMO | Admitting: Nurse Practitioner

## 2021-08-18 VITALS — BP 125/65 | HR 101 | Temp 98.1°F | Ht 68.0 in | Wt 159.0 lb

## 2021-08-18 DIAGNOSIS — R110 Nausea: Secondary | ICD-10-CM | POA: Diagnosis not present

## 2021-08-18 DIAGNOSIS — R197 Diarrhea, unspecified: Secondary | ICD-10-CM

## 2021-08-18 DIAGNOSIS — J449 Chronic obstructive pulmonary disease, unspecified: Secondary | ICD-10-CM | POA: Diagnosis not present

## 2021-08-18 DIAGNOSIS — H9202 Otalgia, left ear: Secondary | ICD-10-CM | POA: Diagnosis not present

## 2021-08-18 MED ORDER — LOPERAMIDE HCL 2 MG PO TABS: 2.0000 mg | ORAL_TABLET | Freq: Four times a day (QID) | ORAL | 0 refills | Status: DC | PRN

## 2021-08-18 MED ORDER — ONDANSETRON HCL 4 MG PO TABS: 4.0000 mg | ORAL_TABLET | Freq: Three times a day (TID) | ORAL | 0 refills | Status: DC | PRN

## 2021-08-18 NOTE — Progress Notes (Signed)
Acute Office Visit  Subjective:    Patient ID: Victor Shields, male    DOB: 1951/05/18, 71 y.o.   MRN: [id number]  Chief Complaint  Patient presents with   Ear Pain    Otalgia  There is pain in the left ear. This is a recurrent problem. Episode onset: few days ago. The problem has been unchanged. There has been no fever. The pain is mild. Associated symptoms include diarrhea. Pertinent negatives include no abdominal pain, ear discharge, headaches, hearing loss or vomiting. He has tried antibiotics for the symptoms. Improvement on treatment: patient not taking.  Diarrhea  This is a new problem. The current episode started yesterday. The problem occurs 2 to 4 times per day. The problem has been unchanged. The stool consistency is described as Watery. The patient states that diarrhea does not awaken him from sleep. Pertinent negatives include no abdominal pain, chills, headaches, increased  flatus or vomiting. Exacerbated by: Antibiotics. He has tried nothing for the symptoms.   Concerning medication induced nausea.  Patient started antibiotics for ear pain and stopped medication 2 days ago due to nausea and diarrhea.  No fever, chills, abdominal pain or headache associated with complaint.   Past Medical History:  Diagnosis Date   Gout     Past Surgical History:  Procedure Laterality Date   CATARACT EXTRACTION     COMPRESSION HIP SCREW Left 02/17/2020   Procedure: OPEN TREATMENT INTERNAL FIXATION LEFT HIP;  Surgeon: Carole Civil, MD;  Location: [facility];  Service: Orthopedics;  Laterality: Left;    History reviewed. No pertinent family history.  Social History   Socioeconomic History   Marital status: Married    Spouse name: Butch Penny   Number of children: 1   Years of education: Not on file   Highest education level: 9th grade  Occupational History   Occupation: Retired    Fish farm manager: [employer]  Tobacco Use   Smoking status: Every Day    Packs/day: 1.00     Years: 40.00    Pack years: 40.00    Types: Cigarettes    Start date: 07/10/1975   Smokeless tobacco: Never  Vaping Use   Vaping Use: Never used  Substance and Sexual Activity   Alcohol use: Yes    Alcohol/week: 24.0 standard drinks    Types: 24 Standard drinks or equivalent per week    Comment: "couple of beers daily"   Drug use: No   Sexual activity: Not Currently  Other Topics Concern   Not on file  Social History Narrative   Lives at home with wife - one level living   Social Determinants of Health   Financial Resource Strain: Low Risk    Difficulty of Paying Living Expenses: Not hard at all  Food Insecurity: No Food Insecurity   Worried About Charity fundraiser in the Last Year: Never true   Arboriculturist in the Last Year: Never true  Transportation Needs: No Transportation Needs   Lack of Transportation (Medical): No   Lack of Transportation (Non-Medical): No  Physical Activity: Insufficiently Active   Days of Exercise per Week: 7 days   Minutes of Exercise per Session: 10 min  Stress: No Stress Concern Present   Feeling of Stress : Not at all  Social Connections: Moderately Integrated   Frequency of Communication with Friends and Family: Three times a week   Frequency of Social Gatherings with Friends and Family: Twice a week   Attends Religious  Services: Never   Marine scientist or Organizations: Yes   Attends Archivist Meetings: 1 to 4 times per year   Marital Status: Married  Human resources officer Violence: Not At Risk   Fear of Current or Ex-Partner: No   Emotionally Abused: No   Physically Abused: No   Sexually Abused: No    Outpatient Medications Prior to Visit  Medication Sig Dispense Refill   albuterol (VENTOLIN HFA) 108 (90 Base) MCG/ACT inhaler TAKE 2 PUFFS BY MOUTH EVERY 6 HOURS AS NEEDED FOR WHEEZE OR SHORTNESS OF BREATH 8.5 each 2   baclofen (LIORESAL) 10 MG tablet Take 1 tablet (10 mg total) by mouth 3 (three) times daily. 30  each 0   budesonide-formoterol (SYMBICORT) 160-4.5 MCG/ACT inhaler INHALE 2 PUFFS INTO THE LUNGS TWICE A DAY 10.2 each 3   clotrimazole (LOTRIMIN) 1 % cream Apply 1 application topically as needed. 113 g 6   docusate sodium (COLACE) 100 MG capsule Take 1 capsule (100 mg total) by mouth 2 (two) times daily. 10 capsule 0   fluticasone (FLONASE) 50 MCG/ACT nasal spray Place 2 sprays into both nostrils daily. 16 g 6   nystatin (MYCOSTATIN/NYSTOP) powder Apply 1 application topically 3 (three) times daily. 60 g 0   rosuvastatin (CRESTOR) 10 MG tablet Take 1 tablet (10 mg total) by mouth daily. 90 tablet 3   trolamine salicylate (ASPERCREME) 10 % cream Apply 1 application topically as needed for muscle pain.     cefdinir (OMNICEF) 300 MG capsule Take 1 capsule (300 mg total) by mouth 2 (two) times daily. 1 po BID (Patient not taking: Reported on 08/18/2021) 20 capsule 0   No facility-administered medications prior to visit.    Allergies  Allergen Reactions   Penicillins Swelling    Review of Systems  Constitutional:  Negative for chills.  HENT:  Positive for ear pain. Negative for ear discharge and hearing loss.   Respiratory: Negative.    Gastrointestinal:  Positive for diarrhea and nausea. Negative for abdominal pain, flatus and vomiting.  Neurological:  Negative for headaches.  All other systems reviewed and are negative.     Objective:    Physical Exam Vitals and nursing note reviewed.  Constitutional:      Appearance: Normal appearance.  HENT:     Right Ear: External ear normal.     Left Ear: External ear normal. Tenderness present. No drainage or swelling. No foreign body.     Nose: Nose normal.  Eyes:     Conjunctiva/sclera: Conjunctivae normal.  Cardiovascular:     Rate and Rhythm: Normal rate.     Pulses: Normal pulses.     Heart sounds: Normal heart sounds.  Pulmonary:     Effort: Pulmonary effort is normal.     Breath sounds: Normal breath sounds.  Abdominal:      General: Bowel sounds are normal.     Tenderness: There is no right CVA tenderness or left CVA tenderness.  Skin:    General: Skin is warm.     Findings: No rash.  Neurological:     Mental Status: He is alert and oriented to person, place, and time.  Psychiatric:        Behavior: Behavior normal.    BP 125/65    Pulse (!) 101    Temp 98.1 F (36.7 C)    Ht '5\' 8"'  (1.727 m)    Wt 159 lb (72.1 kg)    SpO2 96%    BMI 24.18  kg/m  Wt Readings from Last 3 Encounters:  08/18/21 159 lb (72.1 kg)  05/19/21 161 lb 6.4 oz (73.2 kg)  11/24/20 160 lb (72.6 kg)    Health Maintenance Due  Topic Date Due   COVID-19 Vaccine (1) Never done   Pneumonia Vaccine 60+ Years old (2 - PPSV23 if available, else PCV20) 11/15/2021    There are no preventive care reminders to display for this patient.   Lab Results  Component Value Date   TSH 1.682 02/15/2020   Lab Results  Component Value Date   WBC 10.9 (H) 05/19/2021   HGB 16.6 05/19/2021   HCT 48.5 05/19/2021   MCV 91 05/19/2021   PLT 297 05/19/2021   Lab Results  Component Value Date   NA 142 05/19/2021   K 4.8 05/19/2021   CO2 23 05/19/2021   GLUCOSE 91 05/19/2021   BUN 11 05/19/2021   CREATININE 0.70 (L) 05/19/2021   BILITOT 1.0 05/19/2021   ALKPHOS 123 (H) 05/19/2021   AST 33 05/19/2021   ALT 17 05/19/2021   PROT 6.6 05/19/2021   ALBUMIN 3.8 05/19/2021   CALCIUM 9.7 05/19/2021   ANIONGAP 7 02/22/2020   EGFR 99 05/19/2021   Lab Results  Component Value Date   CHOL 200 (H) 11/15/2020   Lab Results  Component Value Date   HDL 74 11/15/2020   Lab Results  Component Value Date   LDLCALC 114 (H) 11/15/2020   Lab Results  Component Value Date   TRIG 66 11/15/2020   Lab Results  Component Value Date   CHOLHDL 2.7 11/15/2020   Lab Results  Component Value Date   HGBA1C 4.5 (L) 02/15/2020       Assessment & Plan:   Ear pain not well controlled.  Patient already on antibiotic but discontinued medication due to  nausea and diarrhea. Provided education to patient to continue medication as prescribed and start Zofran 4 mg tablet by mouth for nausea, Imodium for diarrhea, increase hydration, probiotic, and increase hydration.  Advised patient to follow-up with worsening or resolved symptoms.  Patient verbalized understanding.     Problem List Items Addressed This Visit   None Visit Diagnoses     Left ear pain    -  Primary   Nausea       Relevant Medications   ondansetron (ZOFRAN) 4 MG tablet   Diarrhea, unspecified type       Relevant Medications   loperamide (IMODIUM A-D) 2 MG tablet        Meds ordered this encounter  Medications   ondansetron (ZOFRAN) 4 MG tablet    Sig: Take 1 tablet (4 mg total) by mouth every 8 (eight) hours as needed for nausea or vomiting.    Dispense:  20 tablet    Refill:  0    Order Specific Question:   Supervising Provider    Answer:   Jeneen Rinks   loperamide (IMODIUM A-D) 2 MG tablet    Sig: Take 1 tablet (2 mg total) by mouth 4 (four) times daily as needed for diarrhea or loose stools.    Dispense:  30 tablet    Refill:  0    Order Specific Question:   Supervising Provider    Answer:   Claretta Fraise [[id number]]     Ivy Lynn, NP

## 2021-08-18 NOTE — Patient Instructions (Signed)
Earache, Adult An earache, or ear pain, can be caused by many things, including: An infection. Ear wax buildup. Ear pressure. Something in the ear that should not be there (foreign body). A sore throat. Tooth problems. Jaw problems. Treatment of the earache will depend on the cause. If the cause is not clear or cannot be determined, you may need to watch your symptoms until your earache goes away or until a cause is found. Follow these instructions at home: Medicines Take or apply over-the-counter and prescription medicines only as told by your health care provider. If you were prescribed an antibiotic medicine, use it as told by your health care provider. Do not stop using the antibiotic even if you start to feel better. Do not put anything in your ear other than medicine that is prescribed by your health care provider. Managing pain If directed, apply heat to the affected area as often as told by your health care provider. Use the heat source that your health care provider recommends, such as a moist heat pack or a heating pad. Place a towel between your skin and the heat source. Leave the heat on for 20-30 minutes. Remove the heat if your skin turns bright red. This is especially important if you are unable to feel pain, heat, or cold. You may have a greater risk of getting burned. If directed, put ice on the affected area as often as told by your health care provider. To do this:   Put ice in a plastic bag. Place a towel between your skin and the bag. Leave the ice on for 20 minutes, 2-3 times a day. General instructions Pay attention to any changes in your symptoms. Try resting in an upright position instead of lying down. This may help to reduce pressure in your ear and relieve pain. Chew gum if it helps to relieve your ear pain. Treat any allergies as told by your health care provider. Drink enough fluid to keep your urine pale yellow. It is up to you to get the results of any  tests that were done. Ask your health care provider, or the department that is doing the tests, when your results will be ready. Keep all follow-up visits as told by your health care provider. This is important. Contact a health care provider if: Your pain does not improve within 2 days. Your earache gets worse. You have new symptoms. You have a fever. Get help right away if you: Have a severe headache. Have a stiff neck. Have trouble swallowing. Have redness or swelling behind your ear. Have fluid or blood coming from your ear. Have hearing loss. Feel dizzy. Summary An earache, or ear pain, can be caused by many things. Treatment of the earache will depend on the cause. Follow recommendations from your health care provider to treat your ear pain. If the cause is not clear or cannot be determined, you may need to watch your symptoms until your earache goes away or until a cause is found. Keep all follow-up visits as told by your health care provider. This is important. This information is not intended to replace advice given to you by your health care provider. Make sure you discuss any questions you have with your health care provider. Document Revised: 01/31/2019 Document Reviewed: 01/31/2019 Elsevier Patient Education  2022 [employer]  

## 2021-11-13 ENCOUNTER — Other Ambulatory Visit: Payer: Self-pay | Admitting: Family

## 2021-11-27 ENCOUNTER — Ambulatory Visit (INDEPENDENT_AMBULATORY_CARE_PROVIDER_SITE_OTHER): Payer: Medicare HMO

## 2021-11-27 VITALS — Wt 159.0 lb

## 2021-11-27 DIAGNOSIS — Z0000 Encounter for general adult medical examination without abnormal findings: Secondary | ICD-10-CM | POA: Diagnosis not present

## 2021-11-27 NOTE — Progress Notes (Signed)
Subjective:   Victor Shields is a 71 y.o. male who presents for Medicare Annual/Subsequent preventive examination.  Virtual Visit via Telephone Note  I connected with  Victor Shields on 11/27/21 at  2:00 PM EDT by telephone and verified that I am speaking with the correct person using two identifiers.  Location: Patient: Home Provider: [facility] Persons participating in the virtual visit: patient/Nurse Health Advisor   I discussed the limitations, risks, security and privacy concerns of performing an evaluation and management service by telephone and the availability of in person appointments. The patient expressed understanding and agreed to proceed.  Interactive audio and video telecommunications were attempted between this nurse and patient, however failed, due to patient having technical difficulties OR patient did not have access to video capability.  We continued and completed visit with audio only.  Some vital signs may be absent or patient reported.   Julliette Frentz E Zenaida Tesar, LPN   Review of Systems     Cardiac Risk Factors include: advanced age (>55men, >65 women);sedentary lifestyle;smoking/ tobacco exposure;dyslipidemia;male gender;Other (see comment), Risk factor comments: COPD     Objective:    Today's Vitals   11/27/21 1401 11/27/21 1405  Weight: 159 lb (72.1 kg)   PainSc:  8    Body mass index is 24.18 kg/m.     11/27/2021    2:12 PM 11/24/2020    5:20 PM 02/15/2020    7:55 PM 11/24/2019    2:40 PM 11/13/2018    2:51 PM 02/04/2018    2:58 PM  Advanced Directives  Does Patient Have a Medical Advance Directive? No No No No No No  Would patient like information on creating a medical advance directive? No - Patient declined Yes (MAU/Ambulatory/Procedural Areas - Information given) No - Patient declined No - Patient declined Yes (MAU/Ambulatory/Procedural Areas - Information given)     Current Medications (verified) Outpatient Encounter Medications as of 11/27/2021   Medication Sig   albuterol (VENTOLIN HFA) 108 (90 Base) MCG/ACT inhaler TAKE 2 PUFFS BY MOUTH EVERY 6 HOURS AS NEEDED FOR WHEEZE OR SHORTNESS OF BREATH   budesonide-formoterol (SYMBICORT) 160-4.5 MCG/ACT inhaler INHALE 2 PUFFS INTO THE LUNGS TWICE A DAY   docusate sodium (COLACE) 100 MG capsule Take 1 capsule (100 mg total) by mouth 2 (two) times daily.   fluticasone (FLONASE) 50 MCG/ACT nasal spray Place 2 sprays into both nostrils daily.   loperamide (IMODIUM A-D) 2 MG tablet Take 1 tablet (2 mg total) by mouth 4 (four) times daily as needed for diarrhea or loose stools.   rosuvastatin (CRESTOR) 10 MG tablet Take 1 tablet (10 mg total) by mouth daily.   trolamine salicylate (ASPERCREME) 10 % cream Apply 1 application topically as needed for muscle pain.   baclofen (LIORESAL) 10 MG tablet Take 1 tablet (10 mg total) by mouth 3 (three) times daily. (Patient not taking: Reported on 11/27/2021)   ondansetron (ZOFRAN) 4 MG tablet Take 1 tablet (4 mg total) by mouth every 8 (eight) hours as needed for nausea or vomiting. (Patient not taking: Reported on 11/27/2021)   [DISCONTINUED] cefdinir (OMNICEF) 300 MG capsule Take 1 capsule (300 mg total) by mouth 2 (two) times daily. 1 po BID (Patient not taking: Reported on 11/27/2021)   [DISCONTINUED] clotrimazole (LOTRIMIN) 1 % cream Apply 1 application topically as needed. (Patient not taking: Reported on 11/27/2021)   [DISCONTINUED] nystatin (MYCOSTATIN/NYSTOP) powder Apply 1 application topically 3 (three) times daily. (Patient not taking: Reported on 11/27/2021)   No facility-administered encounter  medications on file as of 11/27/2021.    Allergies (verified) Penicillins   History: Past Medical History:  Diagnosis Date   Gout    Past Surgical History:  Procedure Laterality Date   CATARACT EXTRACTION     COMPRESSION HIP SCREW Left 02/17/2020   Procedure: OPEN TREATMENT INTERNAL FIXATION LEFT HIP;  Surgeon: Vickki HearingHarrison, Stanley E, MD;  Location: [facility];   Service: Orthopedics;  Laterality: Left;   History reviewed. No pertinent family history. Social History   Socioeconomic History   Marital status: Married    Spouse name: Victor Shields   Number of children: 1   Years of education: Not on file   Highest education level: 9th grade  Occupational History   Occupation: Retired    Associate Professormployer: [employer]  Tobacco Use   Smoking status: Every Day    Packs/day: 0.50    Years: 40.00    Pack years: 20.00    Types: Cigarettes    Start date: 07/10/1975   Smokeless tobacco: Never  Vaping Use   Vaping Use: Never used  Substance and Sexual Activity   Alcohol use: Yes    Alcohol/week: 24.0 standard drinks    Types: 24 Standard drinks or equivalent per week    Comment: "couple of beers daily"   Drug use: No   Sexual activity: Not Currently  Other Topics Concern   Not on file  Social History Narrative   Lives at home with wife - one level living   Social Determinants of Health   Financial Resource Strain: Low Risk    Difficulty of Paying Living Expenses: Not hard at all  Food Insecurity: No Food Insecurity   Worried About Programme researcher, broadcasting/film/videounning Out of Food in the Last Year: Never true   Baristaan Out of Food in the Last Year: Never true  Transportation Needs: No Transportation Needs   Lack of Transportation (Medical): No   Lack of Transportation (Non-Medical): No  Physical Activity: Insufficiently Active   Days of Exercise per Week: 7 days   Minutes of Exercise per Session: 10 min  Stress: No Stress Concern Present   Feeling of Stress : Not at all  Social Connections: Moderately Isolated   Frequency of Communication with Friends and Family: More than three times a week   Frequency of Social Gatherings with Friends and Family: Three times a week   Attends Religious Services: Never   Active Member of Clubs or Organizations: No   Attends BankerClub or Organization Meetings: Never   Marital Status: Married    Tobacco Counseling Ready to quit: Not  Answered Counseling given: Not Answered   Clinical Intake:  Pre-visit preparation completed: Yes  Pain : 0-10 Pain Score: 8  Pain Type: Chronic pain Pain Location: Leg Pain Orientation: Right, Left Pain Descriptors / Indicators: Aching, Sore, Squeezing Pain Onset: More than a month ago Pain Frequency: Intermittent     BMI - recorded: 24.18 Nutritional Status: BMI of 19-24  Normal Nutritional Risks: None Diabetes: No  How often do you need to have someone help you when you read instructions, pamphlets, or other written materials from your doctor or pharmacy?: 1 - Never  Diabetic? no  Interpreter Needed?: No  Information entered by :: Yani Coventry, LPN   Activities of Daily Living    11/27/2021    2:12 PM  In your present state of health, do you have any difficulty performing the following activities:  Hearing? 1  Vision? 0  Difficulty concentrating or making decisions? 0  Walking  or climbing stairs? 1  Dressing or bathing? 0  Doing errands, shopping? 1  Comment afraid to drive due to feet cramping up  Preparing Food and eating ? N  Using the Toilet? N  In the past six months, have you accidently leaked urine? N  Do you have problems with loss of bowel control? N  Managing your Medications? N  Managing your Finances? N  Housekeeping or managing your Housekeeping? N    Patient Care Team: Junie Spencer, FNP as PCP - General (Nurse Practitioner)  Indicate any recent Medical Services you may have received from other than [facility] providers in the past year (date may be approximate).     Assessment:   This is a routine wellness examination for Rodell.  Hearing/Vision screen Hearing Screening - Comments:: C/o moderate hearing difficulties - declines hearing aids at this time Vision Screening - Comments:: Wears rx glasses - up to date with routine eye exams with [facility]  Dietary issues and exercise activities discussed: Current Exercise Habits: The  patient does not participate in regular exercise at present, Exercise limited by: orthopedic condition(s);respiratory conditions(s)   Goals Addressed   None    Depression Screen    11/27/2021    2:10 PM 08/18/2021    8:18 AM 11/24/2020    5:16 PM 11/15/2020   11:10 AM 11/24/2019    2:35 PM 11/13/2018    2:52 PM 03/20/2018    9:21 AM  PHQ 2/9 Scores  PHQ - 2 Score 0 0 0 0 0 0 0  PHQ- 9 Score  0         Fall Risk    11/27/2021    2:07 PM 08/18/2021    8:19 AM 11/24/2020    5:19 PM 11/15/2020   11:10 AM 11/24/2019    2:35 PM  Fall Risk   Falls in the past year? 0 0 1 0 0  Number falls in past yr: 0  0    Injury with Fall? 0  1    Risk for fall due to : Impaired balance/gait;Orthopedic patient  Orthopedic patient;Impaired vision    Follow up Education provided;Falls prevention discussed  Education provided;Falls prevention discussed      FALL RISK PREVENTION PERTAINING TO THE HOME:  Any stairs in or around the home? Yes  If so, are there any without handrails? No  Home free of loose throw rugs in walkways, pet beds, electrical cords, etc? Yes  Adequate lighting in your home to reduce risk of falls? Yes   ASSISTIVE DEVICES UTILIZED TO PREVENT FALLS:  Life alert? No  Use of a cane, walker or w/c? Yes  Grab bars in the bathroom? Yes  Shower chair or bench in shower? Yes  Elevated toilet seat or a handicapped toilet? No   TIMED UP AND GO:  Was the test performed? No . Telephonic visit  Cognitive Function:        11/27/2021    2:14 PM 11/24/2019    2:57 PM 11/13/2018    2:53 PM  6CIT Screen  What Year? 0 points 0 points 0 points  What month? 0 points 0 points 0 points  What time? 0 points 0 points 0 points  Count back from 20 0 points 0 points 0 points  Months in reverse 0 points 2 points 0 points  Repeat phrase 2 points 2 points 0 points  Total Score 2 points 4 points 0 points    Immunizations Immunization History  Administered Date(s) Administered  Fluad Quad(high  Dose 65+) 05/19/2021   Pneumococcal Conjugate-13 11/15/2020    TDAP status: Due, Education has been provided regarding the importance of this vaccine. Advised may receive this vaccine at local pharmacy or Health Dept. Aware to provide a copy of the vaccination record if obtained from local pharmacy or Health Dept. Verbalized acceptance and understanding.  Flu Vaccine status: Up to date  Pneumococcal vaccine status: Due, Education has been provided regarding the importance of this vaccine. Advised may receive this vaccine at local pharmacy or Health Dept. Aware to provide a copy of the vaccination record if obtained from local pharmacy or Health Dept. Verbalized acceptance and understanding.  Covid-19 vaccine status: Declined, Education has been provided regarding the importance of this vaccine but patient still declined. Advised may receive this vaccine at local pharmacy or Health Dept.or vaccine clinic. Aware to provide a copy of the vaccination record if obtained from local pharmacy or Health Dept. Verbalized acceptance and understanding.  Qualifies for Shingles Vaccine? Yes   Zostavax completed No   Shingrix Completed?: No.    Education has been provided regarding the importance of this vaccine. Patient has been advised to call insurance company to determine out of pocket expense if they have not yet received this vaccine. Advised may also receive vaccine at local pharmacy or Health Dept. Verbalized acceptance and understanding.  Screening Tests Health Maintenance  Topic Date Due   COVID-19 Vaccine (1) Never done   TETANUS/TDAP  Never done   Zoster Vaccines- Shingrix (1 of 2) Never done   Pneumonia Vaccine 77+ Years old (2 - PPSV23 if available, else PCV20) 11/15/2021   INFLUENZA VACCINE  02/06/2022   Fecal DNA (Cologuard)  11/24/2023   Hepatitis C Screening  Completed   HPV VACCINES  Aged Out    Health Maintenance  Health Maintenance Due  Topic Date Due   COVID-19 Vaccine (1)  Never done   TETANUS/TDAP  Never done   Zoster Vaccines- Shingrix (1 of 2) Never done   Pneumonia Vaccine 78+ Years old (2 - PPSV23 if available, else PCV20) 11/15/2021    Colorectal cancer screening: Type of screening: Cologuard. Completed 11/23/2020. Repeat every 3 years  Lung Cancer Screening: (Low Dose CT Chest recommended if Age 23-80 years, 30 pack-year currently smoking OR have quit w/in 15years.) does qualify.   Lung Cancer Screening Referral: declines  Additional Screening:  Hepatitis C Screening: does qualify; Completed 11/15/2020  Vision Screening: Recommended annual ophthalmology exams for early detection of glaucoma and other disorders of the eye. Is the patient up to date with their annual eye exam?  Yes  Who is the provider or what is the name of the office in which the patient attends annual eye exams? [facility] If pt is not established with a provider, would they like to be referred to a provider to establish care? No .   Dental Screening: Recommended annual dental exams for proper oral hygiene  Community Resource Referral / Chronic Care Management: CRR required this visit?  No   CCM required this visit?  No      Plan:     I have personally reviewed and noted the following in the patient's chart:   Medical and social history Use of alcohol, tobacco or illicit drugs  Current medications and supplements including opioid prescriptions. Patient is not currently taking opioid prescriptions. Functional ability and status Nutritional status Physical activity Advanced directives List of other physicians Hospitalizations, surgeries, and ER visits in previous 12 months Vitals  Screenings to include cognitive, depression, and falls Referrals and appointments  In addition, I have reviewed and discussed with patient certain preventive protocols, quality metrics, and best practice recommendations. A written personalized care plan for preventive services as well  as general preventive health recommendations were provided to patient.     [place] Constable, LPN   [id number]/[id number]   Nurse Notes: Would like rx for Viagra. 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

## 2021-11-27 NOTE — Patient Instructions (Signed)
Victor Shields , Thank you for taking time to come for your Medicare Wellness Visit. I appreciate your ongoing commitment to your health goals. Please review the following plan we discussed and let me know if I can assist you in the future.   Screening recommendations/referrals: Colonoscopy: Cologuard done 11/23/2020 - repeat in 3 years Recommended yearly ophthalmology/optometry visit for glaucoma screening and checkup Recommended yearly dental visit for hygiene and checkup  Vaccinations: Influenza vaccine: Done 05/19/2021 - Repeat annually Pneumococcal vaccine: Prevnar-13 Done 11/15/2020 - due for Pneumovax-23 Tdap vaccine: Due - recommended every 10 years Shingles vaccine: Due - Shingrix is 2 doses 2-6 months apart and over 90% effective     Covid-19: Declined  Advanced directives: Advance directive discussed with you today. Even though you declined this today, please call our office should you change your mind, and we can give you the proper paperwork for you to fill out.   Conditions/risks identified: Aim for 30 minutes of exercise or brisk walking, 6-8 glasses of water, and 5 servings of fruits and vegetables each day.   Next appointment: Follow up in one year for your annual wellness visit.   Preventive Care 72 Years and Older, Male  Preventive care refers to lifestyle choices and visits with your health care provider that can promote health and wellness. What does preventive care include? A yearly physical exam. This is also called an annual well check. Dental exams once or twice a year. Routine eye exams. Ask your health care provider how often you should have your eyes checked. Personal lifestyle choices, including: Daily care of your teeth and gums. Regular physical activity. Eating a healthy diet. Avoiding tobacco and drug use. Limiting alcohol use. Practicing safe sex. Taking low doses of aspirin every day. Taking vitamin and mineral supplements as recommended by your health  care provider. What happens during an annual well check? The services and screenings done by your health care provider during your annual well check will depend on your age, overall health, lifestyle risk factors, and family history of disease. Counseling  Your health care provider may ask you questions about your: Alcohol use. Tobacco use. Drug use. Emotional well-being. Home and relationship well-being. Sexual activity. Eating habits. History of falls. Memory and ability to understand (cognition). Work and work Astronomer. Screening  You may have the following tests or measurements: Height, weight, and BMI. Blood pressure. Lipid and cholesterol levels. These may be checked every 5 years, or more frequently if you are over 72 years old. Skin check. Lung cancer screening. You may have this screening every year starting at age 72 if you have a 30-pack-year history of smoking and currently smoke or have quit within the past 15 years. Fecal occult blood test (FOBT) of the stool. You may have this test every year starting at age 72. Flexible sigmoidoscopy or colonoscopy. You may have a sigmoidoscopy every 5 years or a colonoscopy every 10 years starting at age 72. Prostate cancer screening. Recommendations will vary depending on your family history and other risks. Hepatitis C blood test. Hepatitis B blood test. Sexually transmitted disease (STD) testing. Diabetes screening. This is done by checking your blood sugar (glucose) after you have not eaten for a while (fasting). You may have this done every 1-3 years. Abdominal aortic aneurysm (AAA) screening. You may need this if you are a current or former smoker. Osteoporosis. You may be screened starting at age 72 if you are at high risk. Talk with your health care provider about  your test results, treatment options, and if necessary, the need for more tests. Vaccines  Your health care provider may recommend certain vaccines, such  as: Influenza vaccine. This is recommended every year. Tetanus, diphtheria, and acellular pertussis (Tdap, Td) vaccine. You may need a Td booster every 10 years. Zoster vaccine. You may need this after age 72. Pneumococcal 13-valent conjugate (PCV13) vaccine. One dose is recommended after age 72. Pneumococcal polysaccharide (PPSV23) vaccine. One dose is recommended after age 72. Talk to your health care provider about which screenings and vaccines you need and how often you need them. This information is not intended to replace advice given to you by your health care provider. Make sure you discuss any questions you have with your health care provider. Document Released: 07/22/2015 Document Revised: 03/14/2016 Document Reviewed: 04/26/2015 Elsevier Interactive Patient Education  2017 [place] Prevention in the Home Falls can cause injuries. They can happen to people of all ages. There are many things you can do to make your home safe and to help prevent falls. What can I do on the outside of my home? Regularly fix the edges of walkways and driveways and fix any cracks. Remove anything that might make you trip as you walk through a door, such as a raised step or threshold. Trim any bushes or trees on the path to your home. Use bright outdoor lighting. Clear any walking paths of anything that might make someone trip, such as rocks or tools. Regularly check to see if handrails are loose or broken. Make sure that both sides of any steps have handrails. Any raised decks and porches should have guardrails on the edges. Have any leaves, snow, or ice cleared regularly. Use sand or salt on walking paths during winter. Clean up any spills in your garage right away. This includes oil or grease spills. What can I do in the bathroom? Use night lights. Install grab bars by the toilet and in the tub and shower. Do not use towel bars as grab bars. Use non-skid mats or decals in the tub or  shower. If you need to sit down in the shower, use a plastic, non-slip stool. Keep the floor dry. Clean up any water that spills on the floor as soon as it happens. Remove soap buildup in the tub or shower regularly. Attach bath mats securely with double-sided non-slip rug tape. Do not have throw rugs and other things on the floor that can make you trip. What can I do in the bedroom? Use night lights. Make sure that you have a light by your bed that is easy to reach. Do not use any sheets or blankets that are too big for your bed. They should not hang down onto the floor. Have a firm chair that has side arms. You can use this for support while you get dressed. Do not have throw rugs and other things on the floor that can make you trip. What can I do in the kitchen? Clean up any spills right away. Avoid walking on wet floors. Keep items that you use a lot in easy-to-reach places. If you need to reach something above you, use a strong step stool that has a grab bar. Keep electrical cords out of the way. Do not use floor polish or wax that makes floors slippery. If you must use wax, use non-skid floor wax. Do not have throw rugs and other things on the floor that can make you trip. What can I do with  my stairs? Do not leave any items on the stairs. Make sure that there are handrails on both sides of the stairs and use them. Fix handrails that are broken or loose. Make sure that handrails are as long as the stairways. Check any carpeting to make sure that it is firmly attached to the stairs. Fix any carpet that is loose or worn. Avoid having throw rugs at the top or bottom of the stairs. If you do have throw rugs, attach them to the floor with carpet tape. Make sure that you have a light switch at the top of the stairs and the bottom of the stairs. If you do not have them, ask someone to add them for you. What else can I do to help prevent falls? Wear shoes that: Do not have high heels. Have  rubber bottoms. Are comfortable and fit you well. Are closed at the toe. Do not wear sandals. If you use a stepladder: Make sure that it is fully opened. Do not climb a closed stepladder. Make sure that both sides of the stepladder are locked into place. Ask someone to hold it for you, if possible. Clearly mark and make sure that you can see: Any grab bars or handrails. First and last steps. Where the edge of each step is. Use tools that help you move around (mobility aids) if they are needed. These include: Canes. Walkers. Scooters. Crutches. Turn on the lights when you go into a dark area. Replace any light bulbs as soon as they burn out. Set up your furniture so you have a clear path. Avoid moving your furniture around. If any of your floors are uneven, fix them. If there are any pets around you, be aware of where they are. Review your medicines with your doctor. Some medicines can make you feel dizzy. This can increase your chance of falling. Ask your doctor what other things that you can do to help prevent falls. This information is not intended to replace advice given to you by your health care provider. Make sure you discuss any questions you have with your health care provider. Document Released: 04/21/2009 Document Revised: 12/01/2015 Document Reviewed: 07/30/2014 Elsevier Interactive Patient Education  2017 [employer].

## 2021-11-30 ENCOUNTER — Ambulatory Visit (INDEPENDENT_AMBULATORY_CARE_PROVIDER_SITE_OTHER): Payer: Medicare HMO | Admitting: Family

## 2021-11-30 ENCOUNTER — Encounter: Payer: Self-pay | Admitting: Family

## 2021-11-30 VITALS — BP 126/75 | HR 93 | Temp 97.2°F | Ht 68.0 in | Wt 150.4 lb

## 2021-11-30 DIAGNOSIS — R252 Cramp and spasm: Secondary | ICD-10-CM

## 2021-11-30 DIAGNOSIS — H938X3 Other specified disorders of ear, bilateral: Secondary | ICD-10-CM | POA: Diagnosis not present

## 2021-11-30 DIAGNOSIS — N529 Male erectile dysfunction, unspecified: Secondary | ICD-10-CM | POA: Diagnosis not present

## 2021-11-30 DIAGNOSIS — H6122 Impacted cerumen, left ear: Secondary | ICD-10-CM | POA: Diagnosis not present

## 2021-11-30 MED ORDER — CETIRIZINE HCL 10 MG PO TABS: 10.0000 mg | ORAL_TABLET | Freq: Every day | ORAL | 1 refills | Status: DC

## 2021-11-30 MED ORDER — SILDENAFIL CITRATE 100 MG PO TABS: 50.0000 mg | ORAL_TABLET | Freq: Every day | ORAL | 1 refills | Status: DC | PRN

## 2021-11-30 NOTE — Progress Notes (Signed)
Subjective:    Patient ID: Victor Shields, male    DOB: 07/05/1951, 71 y.o.   MRN: [id number]  Chief Complaint  Patient presents with   Ear Pain   Pt presents to the office today for ear fullness. He is complaining of leg cramps at night. He reports he has tried forcing fluids.   He is also having ED. He can not maintain an erection and requesting Viagra.  Ear Fullness  There is pain in both ears. The current episode started more than 1 month ago. There has been no fever. The pain is mild. Associated symptoms include hearing loss. Pertinent negatives include no ear discharge, headaches, rhinorrhea or sore throat. He has tried antibiotics for the symptoms. The treatment provided mild relief.     Review of Systems  HENT:  Positive for hearing loss. Negative for ear discharge, rhinorrhea and sore throat.   Neurological:  Negative for headaches.  All other systems reviewed and are negative.     Objective:   Physical Exam Vitals reviewed.  Constitutional:      General: He is not in acute distress.    Appearance: He is well-developed.  HENT:     Head: Normocephalic.     Right Ear: Tympanic membrane normal.     Left Ear: There is impacted cerumen.  Eyes:     General:        Right eye: No discharge.        Left eye: No discharge.     Pupils: Pupils are equal, round, and reactive to light.  Neck:     Thyroid: No thyromegaly.  Cardiovascular:     Rate and Rhythm: Normal rate and regular rhythm.     Heart sounds: Normal heart sounds. No murmur heard. Pulmonary:     Effort: Pulmonary effort is normal. No respiratory distress.     Breath sounds: Normal breath sounds. No wheezing.  Abdominal:     General: Bowel sounds are normal. There is no distension.     Palpations: Abdomen is soft.     Tenderness: There is no abdominal tenderness.  Musculoskeletal:        General: No tenderness. Normal range of motion.     Cervical back: Normal range of motion and neck supple.   Skin:    General: Skin is warm and dry.     Findings: No erythema or rash.  Neurological:     Mental Status: He is alert and oriented to person, place, and time.     Cranial Nerves: No cranial nerve deficit.     Deep Tendon Reflexes: Reflexes are normal and symmetric.  Psychiatric:        Behavior: Behavior normal.        Thought Content: Thought content normal.        Judgment: Judgment normal.    BP 126/75   Pulse 93   Temp (!) 97.2 F (36.2 C)   Ht 5\' 8"  (1.727 m)   Wt 150 lb 6.4 oz (68.2 kg)   SpO2 97%   BMI 22.87 kg/m       Assessment & Plan:  Victor Shields comes in today with chief complaint of Ear Pain   Diagnosis and orders addressed:  1. Impacted cerumen, left ear Improved Use Debrox drops as needed  2. Sensation of fullness in both ears Continue flonase  Start zyrtec  - cetirizine (ZYRTEC) 10 MG tablet; Take 1 tablet (10 mg total) by mouth daily.  Dispense: 90 tablet;  Refill: 1  3. Leg cramps Force fluids  Encourage ROM exercises    4. Erectile dysfunction, unspecified erectile dysfunction type Pt started Viagra  - sildenafil (VIAGRA) 100 MG tablet; Take 0.5-1 tablets (50-100 mg total) by mouth daily as needed for erectile dysfunction.  Dispense: 10 tablet; Refill: 1    Health Maintenance reviewed Diet and exercise encouraged  Follow up plan: Keep chronic follow up   Evelina Dun, FNP

## 2021-11-30 NOTE — Patient Instructions (Addendum)
Earwax Buildup, Adult The ears produce a substance called earwax that helps keep bacteria out of the ear and protects the skin in the ear canal. Occasionally, earwax can build up in the ear and cause discomfort or hearing loss. What are the causes? This condition is caused by a buildup of earwax. Ear canals are self-cleaning. Ear wax is made in the outer part of the ear canal and generally falls out in small amounts over time. When the self-cleaning mechanism is not working, earwax builds up and can cause decreased hearing and discomfort. Attempting to clean ears with cotton swabs can push the earwax deep into the ear canal and cause decreased hearing and pain. What increases the risk? This condition is more likely to develop in people who: Clean their ears often with cotton swabs. Pick at their ears. Use earplugs or in-ear headphones often, or wear hearing aids. The following factors may also make you more likely to develop this condition: Being male. Being of older age. Naturally producing more earwax. Having narrow ear canals. Having earwax that is overly thick or sticky. Having excess hair in the ear canal. Having eczema. Being dehydrated. What are the signs or symptoms? Symptoms of this condition include: Reduced or muffled hearing. A feeling of fullness in the ear or feeling that the ear is plugged. Fluid coming from the ear. Ear pain or an itchy ear. Ringing in the ear. Coughing. Balance problems. An obvious piece of earwax that can be seen inside the ear canal. How is this diagnosed? This condition may be diagnosed based on: Your symptoms. Your medical history. An ear exam. During the exam, your health care provider will look into your ear with an instrument called an otoscope. You may have tests, including a hearing test. How is this treated? This condition may be treated by: Using ear drops to soften the earwax. Having the earwax removed by a health care provider. The  health care provider may: Flush the ear with water. Use an instrument that has a loop on the end (curette). Use a suction device. Having surgery to remove the wax buildup. This may be done in severe cases. Follow these instructions at home:  Take over-the-counter and prescription medicines only as told by your health care provider. Do not put any objects, including cotton swabs, into your ear. You can clean the opening of your ear canal with a washcloth or facial tissue. Follow instructions from your health care provider about cleaning your ears. Do not overclean your ears. Drink enough fluid to keep your urine pale yellow. This will help to thin the earwax. Keep all follow-up visits as told. If earwax builds up in your ears often or if you use hearing aids, consider seeing your health care provider for routine, preventive ear cleanings. Ask your health care provider how often you should schedule your cleanings. If you have hearing aids, clean them according to instructions from the manufacturer and your health care provider. Contact a health care provider if: You have ear pain. You develop a fever. You have pus or other fluid coming from your ear. You have hearing loss. You have ringing in your ears that does not go away. You feel like the room is spinning (vertigo). Your symptoms do not improve with treatment. Get help right away if: You have bleeding from the affected ear. You have severe ear pain. Summary Earwax can build up in the ear and cause discomfort or hearing loss. The most common symptoms of this condition include   reduced or muffled hearing, a feeling of fullness in the ear, or feeling that the ear is plugged. This condition may be diagnosed based on your symptoms, your medical history, and an ear exam. This condition may be treated by using ear drops to soften the earwax or by having the earwax removed by a health care provider. Do not put any objects, including cotton  swabs, into your ear. You can clean the opening of your ear canal with a washcloth or facial tissue. This information is not intended to replace advice given to you by your health care provider. Make sure you discuss any questions you have with your health care provider.  Leg Cramps Leg cramps occur when one or more muscles tighten and a person has no control over it (involuntary muscle contraction). Muscle cramps are most common in the calf muscles of the leg. They can occur during exercise or at rest. Leg cramps are painful, and they may last for a few seconds to a few minutes. Cramps may return several times before they finally stop. Usually, leg cramps are not caused by a serious medical problem. In many cases, the cause is not known. Some common causes include: Excessive physical effort (overexertion), such as during intense exercise. Doing the same motion over and over. Staying in a certain position for a long period of time. Improper preparation, form, or technique while doing a sport or an activity. Dehydration. Injury. Side effects of certain medicines. Abnormally low levels of minerals in your blood (electrolytes), especially potassium and calcium. This could result from: Pregnancy. Taking diuretic medicines. Follow these instructions at home: Eating and drinking Drink enough fluid to keep your urine pale yellow. Staying hydrated may help prevent cramps. Eat a healthy diet that includes plenty of nutrients to help your muscles function. A healthy diet includes fruits and vegetables, lean protein, whole grains, and low-fat or nonfat dairy products. Managing pain, stiffness, and swelling     Try massaging, stretching, and relaxing the affected muscle. Do this for several minutes at a time. If directed, put ice on areas that are sore or painful after a cramp. To do this: Put ice in a plastic bag. Place a towel between your skin and the bag. Leave the ice on for 20 minutes, 2-3  times a day. Remove the ice if your skin turns bright red. This is very important. If you cannot feel pain, heat, or cold, you have a greater risk of damage to the area. If directed, apply heat to muscles that are tense or tight. Do this before you exercise, or as often as told by your health care provider. Use the heat source that your health care provider recommends, such as a moist heat pack or a heating pad. To do this: Place a towel between your skin and the heat source. Leave the heat on for 20-30 minutes. Remove the heat if your skin turns bright red. This is especially important if you are unable to feel pain, heat, or cold. You may have a greater risk of getting burned. Try taking hot showers or baths to help relax tight muscles. General instructions If you are having frequent leg cramps, avoid intense exercise for several days. Take over-the-counter and prescription medicines only as told by your health care provider. Keep all follow-up visits. This is important. Contact a health care provider if: Your leg cramps get more severe or more frequent, or they do not improve over time. Your foot becomes cold, numb, or blue. Summary  Muscle cramps can develop in any muscle, but the most common place is in the calf muscles of the leg. Leg cramps are painful, and they may last for a few seconds to a few minutes. Usually, leg cramps are not caused by a serious medical problem. Often, the cause is not known. Stay hydrated, and take over-the-counter and prescription medicines only as told by your health care provider. This information is not intended to replace advice given to you by your health care provider. Make sure you discuss any questions you have with your health care provider. Document Revised: 11/11/2019 Document Reviewed: 11/11/2019 Elsevier Patient Education  2023 Else[employer] Inc[employer]  Document Revised: 10/13/2019 Document Reviewed: 10/13/2019 Elsevier Patient Education  2023 [employer].

## 2022-03-19 ENCOUNTER — Other Ambulatory Visit: Payer: Self-pay | Admitting: Family

## 2022-04-01 ENCOUNTER — Other Ambulatory Visit: Payer: Self-pay | Admitting: Family

## 2022-04-01 DIAGNOSIS — J42 Unspecified chronic bronchitis: Secondary | ICD-10-CM

## 2022-04-27 ENCOUNTER — Other Ambulatory Visit: Payer: Self-pay | Admitting: Family

## 2022-04-27 DIAGNOSIS — J42 Unspecified chronic bronchitis: Secondary | ICD-10-CM

## 2022-07-01 ENCOUNTER — Other Ambulatory Visit: Payer: Self-pay | Admitting: Family

## 2022-07-01 DIAGNOSIS — J42 Unspecified chronic bronchitis: Secondary | ICD-10-CM

## 2022-07-03 ENCOUNTER — Other Ambulatory Visit: Payer: Self-pay | Admitting: Family

## 2022-07-16 ENCOUNTER — Other Ambulatory Visit: Payer: Self-pay | Admitting: Family

## 2022-07-16 DIAGNOSIS — J42 Unspecified chronic bronchitis: Secondary | ICD-10-CM

## 2022-07-16 NOTE — Telephone Encounter (Signed)
[name] [employer] 30 days given 07/03/22

## 2022-08-09 ENCOUNTER — Encounter: Payer: Self-pay | Admitting: Family

## 2022-08-09 ENCOUNTER — Ambulatory Visit (INDEPENDENT_AMBULATORY_CARE_PROVIDER_SITE_OTHER): Payer: Medicare HMO | Admitting: Family

## 2022-08-09 VITALS — BP 135/53 | HR 63 | Temp 97.8°F | Ht 68.0 in | Wt 144.6 lb

## 2022-08-09 DIAGNOSIS — Z720 Tobacco use: Secondary | ICD-10-CM

## 2022-08-09 DIAGNOSIS — Z23 Encounter for immunization: Secondary | ICD-10-CM | POA: Diagnosis not present

## 2022-08-09 DIAGNOSIS — J449 Chronic obstructive pulmonary disease, unspecified: Secondary | ICD-10-CM | POA: Diagnosis not present

## 2022-08-09 DIAGNOSIS — Z8781 Personal history of (healed) traumatic fracture: Secondary | ICD-10-CM

## 2022-08-09 DIAGNOSIS — E785 Hyperlipidemia, unspecified: Secondary | ICD-10-CM

## 2022-08-09 DIAGNOSIS — Z0000 Encounter for general adult medical examination without abnormal findings: Secondary | ICD-10-CM | POA: Diagnosis not present

## 2022-08-09 DIAGNOSIS — H6122 Impacted cerumen, left ear: Secondary | ICD-10-CM | POA: Diagnosis not present

## 2022-08-09 DIAGNOSIS — J438 Other emphysema: Secondary | ICD-10-CM

## 2022-08-09 DIAGNOSIS — Z0001 Encounter for general adult medical examination with abnormal findings: Secondary | ICD-10-CM

## 2022-08-09 MED ORDER — FLUTICASONE FUROATE-VILANTEROL 200-25 MCG/ACT IN AEPB: 1.0000 | INHALATION_SPRAY | Freq: Every day | RESPIRATORY_TRACT | 11 refills | Status: DC

## 2022-08-09 NOTE — Patient Instructions (Signed)
Health Maintenance After Age 73 After age 73, you are at a higher risk for certain long-term diseases and infections as well as injuries from falls. Falls are a major cause of broken bones and head injuries in people who are older than age 73. Getting regular preventive care can help to keep you healthy and well. Preventive care includes getting regular testing and making lifestyle changes as recommended by your health care provider. Talk with your health care provider about: Which screenings and tests you should have. A screening is a test that checks for a disease when you have no symptoms. A diet and exercise plan that is right for you. What should I know about screenings and tests to prevent falls? Screening and testing are the best ways to find a health problem early. Early diagnosis and treatment give you the best chance of managing medical conditions that are common after age 73. Certain conditions and lifestyle choices may make you more likely to have a fall. Your health care provider may recommend: Regular vision checks. Poor vision and conditions such as cataracts can make you more likely to have a fall. If you wear glasses, make sure to get your prescription updated if your vision changes. Medicine review. Work with your health care provider to regularly review all of the medicines you are taking, including over-the-counter medicines. Ask your health care provider about any side effects that may make you more likely to have a fall. Tell your health care provider if any medicines that you take make you feel dizzy or sleepy. Strength and balance checks. Your health care provider may recommend certain tests to check your strength and balance while standing, walking, or changing positions. Foot health exam. Foot pain and numbness, as well as not wearing proper footwear, can make you more likely to have a fall. Screenings, including: Osteoporosis screening. Osteoporosis is a condition that causes  the bones to get weaker and break more easily. Blood pressure screening. Blood pressure changes and medicines to control blood pressure can make you feel dizzy. Depression screening. You may be more likely to have a fall if you have a fear of falling, feel depressed, or feel unable to do activities that you used to do. Alcohol use screening. Using too much alcohol can affect your balance and may make you more likely to have a fall. Follow these instructions at home: Lifestyle Do not drink alcohol if: Your health care provider tells you not to drink. If you drink alcohol: Limit how much you have to: 0-1 drink a day for women. 0-2 drinks a day for men. Know how much alcohol is in your drink. In the [place] one drink equals one 12 oz bottle of beer (355 mL), one 5 oz glass of wine (148 mL), or one 1 oz glass of hard liquor (44 mL). Do not use any products that contain nicotine or tobacco. These products include cigarettes, chewing tobacco, and vaping devices, such as e-cigarettes. If you need help quitting, ask your health care provider. Activity  Follow a regular exercise program to stay fit. This will help you maintain your balance. Ask your health care provider what types of exercise are appropriate for you. If you need a cane or walker, use it as recommended by your health care provider. Wear supportive shoes that have nonskid soles. Safety  Remove any tripping hazards, such as rugs, cords, and clutter. Install safety equipment such as grab bars in bathrooms and safety rails on stairs. Keep rooms and walkways   well-lit. General instructions Talk with your health care provider about your risks for falling. Tell your health care provider if: You fall. Be sure to tell your health care provider about all falls, even ones that seem minor. You feel dizzy, tiredness (fatigue), or off-balance. Take over-the-counter and prescription medicines only as told by your health care provider. These include  supplements. Eat a healthy diet and maintain a healthy weight. A healthy diet includes low-fat dairy products, low-fat (lean) meats, and fiber from whole grains, beans, and lots of fruits and vegetables. Stay current with your vaccines. Schedule regular health, dental, and eye exams. Summary Having a healthy lifestyle and getting preventive care can help to protect your health and wellness after age 73. Screening and testing are the best way to find a health problem early and help you avoid having a fall. Early diagnosis and treatment give you the best chance for managing medical conditions that are more common for people who are older than age 73. Falls are a major cause of broken bones and head injuries in people who are older than age 73. Take precautions to prevent a fall at home. Work with your health care provider to learn what changes you can make to improve your health and wellness and to prevent falls. This information is not intended to replace advice given to you by your health care provider. Make sure you discuss any questions you have with your health care provider. Document Revised: 11/14/2020 Document Reviewed: 11/14/2020 Elsevier Patient Education  2023 [employer] Inc.  

## 2022-08-09 NOTE — Progress Notes (Signed)
Subjective:    Patient ID: Victor Shields, male    DOB: Dec 02, 1950, 72 y.o.   MRN: [id number]  Chief Complaint  Patient presents with   Medical Management of Chronic Issues   PT presents to the office today for CPE and chronic follow up. He has a hx of left hip fracture in 02/2020. Using cane.   He has COPD and continues to smoke 1/2 pack a day.  Hyperlipidemia This is a chronic problem. The current episode started more than 1 year ago. The problem is uncontrolled. Exacerbating diseases include obesity. Current antihyperlipidemic treatment includes diet change. The current treatment provides no improvement of lipids. Risk factors for coronary artery disease include male sex and a sedentary lifestyle.      Review of Systems  All other systems reviewed and are negative.      Objective:   Physical Exam Vitals reviewed.  Constitutional:      General: He is not in acute distress.    Appearance: He is well-developed. He is obese.  HENT:     Head: Normocephalic.     Right Ear: Tympanic membrane normal.     Ears:     Comments: Left ear cerumen impaction  Eyes:     General:        Right eye: No discharge.        Left eye: No discharge.     Pupils: Pupils are equal, round, and reactive to light.  Neck:     Thyroid: No thyromegaly.  Cardiovascular:     Rate and Rhythm: Normal rate and regular rhythm.     Heart sounds: Normal heart sounds. No murmur heard. Pulmonary:     Effort: Pulmonary effort is normal. No respiratory distress.     Breath sounds: Decreased breath sounds present. No wheezing.  Abdominal:     General: Bowel sounds are normal. There is no distension.     Palpations: Abdomen is soft.     Tenderness: There is no abdominal tenderness.  Musculoskeletal:        General: No tenderness. Normal range of motion.     Cervical back: Normal range of motion and neck supple.  Skin:    General: Skin is warm and dry.     Findings: No erythema or rash.   Neurological:     Mental Status: He is alert and oriented to person, place, and time.     Cranial Nerves: No cranial nerve deficit.     Deep Tendon Reflexes: Reflexes are normal and symmetric.  Psychiatric:        Behavior: Behavior normal.        Thought Content: Thought content normal.        Judgment: Judgment normal.    Left ear washed with warm water and peroxide. TM WNL   BP (!) 135/53   Pulse 63   Temp 97.8 F (36.6 C) (Temporal)   Ht 5\' 8"  (1.727 m)   Wt 144 lb 9.6 oz (65.6 kg)   SpO2 99%   BMI 21.99 kg/m      Assessment & Plan:  Victor Shields comes in today with chief complaint of Medical Management of Chronic Issues   Diagnosis and orders addressed:  1. Other emphysema ([place]) Will change Symbicort to Breo per insurance  - Pneumococcal conjugate vaccine 20-valent (Prevnar 20) - fluticasone furoate-vilanterol (BREO ELLIPTA) 200-25 MCG/ACT AEPB; Inhale 1 puff into the lungs daily.  Dispense: 1 each; Refill: 11 - CMP14+EGFR - CBC with Differential/Platelet  2. Need for immunization against influenza - Flu Vaccine QUAD High Dose(Fluad) - CMP14+EGFR - CBC with Differential/Platelet  3. Tobacco abuse - CMP14+EGFR - CBC with Differential/Platelet  4. Hyperlipidemia, unspecified hyperlipidemia type - CMP14+EGFR - CBC with Differential/Platelet  5. History of hip fracture - CMP14+EGFR - CBC with Differential/Platelet  6. Annual physical exam - CMP14+EGFR - CBC with Differential/Platelet - Lipid panel - TSH  7. Impacted cerumen of left ear - CMP14+EGFR - CBC with Differential/Platelet   Labs pending Health Maintenance reviewed Diet and exercise encouraged  Follow up plan: 6 months    Evelina Dun, FNP

## 2022-08-10 LAB — CBC WITH DIFFERENTIAL/PLATELET
Basophils Absolute: 0.1 10*3/uL (ref 0.0–0.2)
Basos: 1 %
EOS (ABSOLUTE): 0.2 10*3/uL (ref 0.0–0.4)
Eos: 1 %
Hematocrit: 44.4 % (ref 37.5–51.0)
Hemoglobin: 15.1 g/dL (ref 13.0–17.7)
Immature Grans (Abs): 0 10*3/uL (ref 0.0–0.1)
Immature Granulocytes: 0 %
Lymphocytes Absolute: 2.4 10*3/uL (ref 0.7–3.1)
Lymphs: 23 %
MCH: 31.3 pg (ref 26.6–33.0)
MCHC: 34 g/dL (ref 31.5–35.7)
MCV: 92 fL (ref 79–97)
Monocytes Absolute: 1 10*3/uL — ABNORMAL HIGH (ref 0.1–0.9)
Monocytes: 10 %
Neutrophils Absolute: 6.9 10*3/uL (ref 1.4–7.0)
Neutrophils: 65 %
Platelets: 368 10*3/uL (ref 150–450)
RBC: 4.83 x10E6/uL (ref 4.14–5.80)
RDW: 12.1 % (ref 11.6–15.4)
WBC: 10.5 10*3/uL (ref 3.4–10.8)

## 2022-08-10 LAB — CMP14+EGFR
ALT: 11 IU/L (ref 0–44)
AST: 20 IU/L (ref 0–40)
Albumin/Globulin Ratio: 1.3 (ref 1.2–2.2)
Albumin: 3.7 g/dL — ABNORMAL LOW (ref 3.8–4.8)
Alkaline Phosphatase: 98 IU/L (ref 44–121)
BUN/Creatinine Ratio: 15 (ref 10–24)
BUN: 8 mg/dL (ref 8–27)
Bilirubin Total: 0.9 mg/dL (ref 0.0–1.2)
CO2: 24 mmol/L (ref 20–29)
Calcium: 9.3 mg/dL (ref 8.6–10.2)
Chloride: 101 mmol/L (ref 96–106)
Creatinine, Ser: 0.55 mg/dL — ABNORMAL LOW (ref 0.76–1.27)
Globulin, Total: 2.9 g/dL (ref 1.5–4.5)
Glucose: 86 mg/dL (ref 70–99)
Potassium: 4.6 mmol/L (ref 3.5–5.2)
Sodium: 141 mmol/L (ref 134–144)
Total Protein: 6.6 g/dL (ref 6.0–8.5)
eGFR: 105 mL/min/{1.73_m2} (ref >59)

## 2022-08-10 LAB — LIPID PANEL
Chol/HDL Ratio: 2.3 ratio (ref 0.0–5.0)
Cholesterol, Total: 177 mg/dL (ref 100–199)
HDL: 76 mg/dL (ref >39)
LDL Chol Calc (NIH): 89 mg/dL (ref 0–99)
Triglycerides: 63 mg/dL (ref 0–149)
VLDL Cholesterol Cal: 12 mg/dL (ref 5–40)

## 2022-08-10 LAB — TSH: TSH: 1.3 u[IU]/mL (ref 0.450–4.500)

## 2022-08-13 ENCOUNTER — Other Ambulatory Visit: Payer: Self-pay | Admitting: Family

## 2022-08-13 DIAGNOSIS — J42 Unspecified chronic bronchitis: Secondary | ICD-10-CM

## 2022-08-13 MED ORDER — ROSUVASTATIN CALCIUM 5 MG PO TABS: 5.0000 mg | ORAL_TABLET | Freq: Every day | ORAL | 3 refills | Status: DC

## 2022-09-06 ENCOUNTER — Encounter: Payer: Self-pay | Admitting: Radiology

## 2022-10-03 ENCOUNTER — Other Ambulatory Visit: Payer: Self-pay | Admitting: Family

## 2022-10-03 DIAGNOSIS — H65193 Other acute nonsuppurative otitis media, bilateral: Secondary | ICD-10-CM

## 2022-11-09 ENCOUNTER — Other Ambulatory Visit: Payer: Self-pay | Admitting: Family

## 2022-11-19 ENCOUNTER — Other Ambulatory Visit: Payer: Self-pay | Admitting: Family

## 2022-11-21 NOTE — Telephone Encounter (Signed)
Spouse says that she was not aware that it was d/c. Pt has been able to get refill up until last month. Now he is out now. Per AVS Return in about 1 year (around 08/10/2023), or if symptoms worsen or fail to improve. What should pt do in the meantime? He has trouble breathing without the rx. Please call back to explain. Spouse aware Neysa Bonito is out and will address when she is back.

## 2022-11-21 NOTE — Telephone Encounter (Signed)
Symbicort was changed to [place] because of insurance. Left message on home vmail making aware. Pt should have refills of Breo at his pharmacy.

## 2022-11-29 ENCOUNTER — Ambulatory Visit (INDEPENDENT_AMBULATORY_CARE_PROVIDER_SITE_OTHER): Payer: Medicare HMO

## 2022-11-29 VITALS — Ht 68.0 in | Wt 145.0 lb

## 2022-11-29 DIAGNOSIS — Z0000 Encounter for general adult medical examination without abnormal findings: Secondary | ICD-10-CM | POA: Diagnosis not present

## 2022-11-29 NOTE — Progress Notes (Signed)
Subjective:   Victor Shields is a 72 y.o. male who presents for Medicare Annual/Subsequent preventive examination. I connected with  Victor Shields on 11/29/22 by a audio enabled telemedicine application and verified that I am speaking with the correct person using two identifiers.  Patient Location: Home  Provider Location: Home Office  I discussed the limitations of evaluation and management by telemedicine. The patient expressed understanding and agreed to proceed.  Review of Systems     Cardiac Risk Factors include: advanced age (>45men, >53 women);male gender;dyslipidemia     Objective:    Today's Vitals   11/29/22 1324  Weight: 145 lb (65.8 kg)  Height: 5\' 8"  (1.727 m)   Body mass index is 22.05 kg/m.     11/29/2022    1:27 PM 11/27/2021    2:12 PM 11/24/2020    5:20 PM 02/15/2020    7:55 PM 11/24/2019    2:40 PM 11/13/2018    2:51 PM 02/04/2018    2:58 PM  Advanced Directives  Does Patient Have a Medical Advance Directive? No No No No No No No  Would patient like information on creating a medical advance directive? No - Patient declined No - Patient declined Yes (MAU/Ambulatory/Procedural Areas - Information given) No - Patient declined No - Patient declined Yes (MAU/Ambulatory/Procedural Areas - Information given)     Current Medications (verified) Outpatient Encounter Medications as of 11/29/2022  Medication Sig   albuterol (VENTOLIN HFA) 108 (90 Base) MCG/ACT inhaler Inhale 2 puffs into the lungs every 6 (six) hours as needed for wheezing or shortness of breath.   baclofen (LIORESAL) 10 MG tablet Take 1 tablet (10 mg total) by mouth 3 (three) times daily.   cetirizine (ZYRTEC) 10 MG tablet Take 1 tablet (10 mg total) by mouth daily.   docusate sodium (COLACE) 100 MG capsule Take 1 capsule (100 mg total) by mouth 2 (two) times daily.   fluticasone (FLONASE) 50 MCG/ACT nasal spray SPRAY 2 SPRAYS INTO EACH NOSTRIL EVERY DAY   fluticasone furoate-vilanterol  (BREO ELLIPTA) 200-25 MCG/ACT AEPB Inhale 1 puff into the lungs daily.   loperamide (IMODIUM A-D) 2 MG tablet Take 1 tablet (2 mg total) by mouth 4 (four) times daily as needed for diarrhea or loose stools.   rosuvastatin (CRESTOR) 5 MG tablet Take 1 tablet (5 mg total) by mouth daily.   sildenafil (VIAGRA) 100 MG tablet Take 0.5-1 tablets (50-100 mg total) by mouth daily as needed for erectile dysfunction.   trolamine salicylate (ASPERCREME) 10 % cream Apply 1 application topically as needed for muscle pain.   No facility-administered encounter medications on file as of 11/29/2022.    Allergies (verified) Penicillins   History: Past Medical History:  Diagnosis Date   Gout    Past Surgical History:  Procedure Laterality Date   CATARACT EXTRACTION     COMPRESSION HIP SCREW Left 02/17/2020   Procedure: OPEN TREATMENT INTERNAL FIXATION LEFT HIP;  Surgeon: Vickki Hearing, MD;  Location: [facility];  Service: Orthopedics;  Laterality: Left;   History reviewed. No pertinent family history. Social History   Socioeconomic History   Marital status: Married    Spouse name: [name]   Number of children: 1   Years of education: Not on file   Highest education level: 9th grade  Occupational History   Occupation: Retired    Associate Professor: [employer]  Tobacco Use   Smoking status: Every Day    Packs/day: 0.50    Years: 40.00  Additional pack years: 0.00    Total pack years: 20.00    Types: Cigarettes    Start date: 07/10/1975   Smokeless tobacco: Never  Vaping Use   Vaping Use: Never used  Substance and Sexual Activity   Alcohol use: Yes    Alcohol/week: 24.0 standard drinks of alcohol    Types: 24 Standard drinks or equivalent per week    Comment: "couple of beers daily"   Drug use: No   Sexual activity: Not Currently  Other Topics Concern   Not on file  Social History Narrative   Lives at home with wife - one level living   Social Determinants of Health   Financial  Resource Strain: Low Risk  (11/29/2022)   Overall Financial Resource Strain (CARDIA)    Difficulty of Paying Living Expenses: Not hard at all  Food Insecurity: No Food Insecurity (11/29/2022)   Hunger Vital Sign    Worried About Running Out of Food in the Last Year: Never true    Ran Out of Food in the Last Year: Never true  Transportation Needs: No Transportation Needs (11/29/2022)   PRAPARE - Administrator, Civil Service (Medical): No    Lack of Transportation (Non-Medical): No  Physical Activity: Insufficiently Active (11/29/2022)   Exercise Vital Sign    Days of Exercise per Week: 3 days    Minutes of Exercise per Session: 30 min  Stress: No Stress Concern Present (11/29/2022)   [employer] of Occupational Health - Occupational Stress Questionnaire    Feeling of Stress : Not at all  Social Connections: Moderately Isolated (11/29/2022)   Social Connection and Isolation Panel [NHANES]    Frequency of Communication with Friends and Family: More than three times a week    Frequency of Social Gatherings with Friends and Family: More than three times a week    Attends Religious Services: Never    Database administrator or Organizations: No    Attends Engineer, structural: Never    Marital Status: Married    Tobacco Counseling Ready to quit: No Counseling given: Not Answered   Clinical Intake:  Pre-visit preparation completed: Yes  Pain : No/denies pain     Nutritional Risks: None Diabetes: No  How often do you need to have someone help you when you read instructions, pamphlets, or other written materials from your doctor or pharmacy?: 1 - Never  Diabetic?no   Interpreter Needed?: No  Information entered by :: Renie Ora, LPN   Activities of Daily Living    11/29/2022    1:27 PM  In your present state of health, do you have any difficulty performing the following activities:  Hearing? 0  Vision? 0  Difficulty concentrating or making  decisions? 0  Walking or climbing stairs? 0  Dressing or bathing? 0  Doing errands, shopping? 0  Preparing Food and eating ? N  Using the Toilet? N  In the past six months, have you accidently leaked urine? N  Do you have problems with loss of bowel control? N  Managing your Medications? N  Managing your Finances? N  Housekeeping or managing your Housekeeping? N    Patient Care Team: Junie Spencer, FNP as PCP - General (Nurse Practitioner)  Indicate any recent Medical Services you may have received from other than [facility] providers in the past year (date may be approximate).     Assessment:   This is a routine wellness examination for Makel.  Hearing/Vision screen Vision  Screening - Comments:: Wears rx glasses - up to date with routine eye exams with  Dr.Johnson   Dietary issues and exercise activities discussed: Current Exercise Habits: Home exercise routine, Type of exercise: strength training/weights, Time (Minutes): 30, Frequency (Times/Week): 3, Weekly Exercise (Minutes/Week): 90, Intensity: Mild, Exercise limited by: None identified   Goals Addressed             This Visit's Progress    DIET - EAT MORE FRUITS AND VEGETABLES   On track      Depression Screen    11/29/2022    1:26 PM 08/09/2022   12:02 PM 11/30/2021    8:10 AM 11/27/2021    2:10 PM 08/18/2021    8:18 AM 11/24/2020    5:16 PM 11/15/2020   11:10 AM  PHQ 2/9 Scores  PHQ - 2 Score 0 0 0 0 0 0 0  PHQ- 9 Score  0   0      Fall Risk    11/29/2022    1:25 PM 08/09/2022   12:02 PM 11/30/2021    8:09 AM 11/27/2021    2:07 PM 08/18/2021    8:19 AM  Fall Risk   Falls in the past year? 0 0 0 0 0  Number falls in past yr: 0   0   Injury with Fall? 0   0   Risk for fall due to : No Fall Risks   Impaired balance/gait;Orthopedic patient   Follow up Falls prevention discussed  Falls evaluation completed Education provided;Falls prevention discussed     FALL RISK PREVENTION PERTAINING TO THE HOME:  Any  stairs in or around the home? No  If so, are there any without handrails? No  Home free of loose throw rugs in walkways, pet beds, electrical cords, etc? Yes  Adequate lighting in your home to reduce risk of falls? Yes   ASSISTIVE DEVICES UTILIZED TO PREVENT FALLS:  Life alert? No  Use of a cane, walker or w/c? Yes  Grab bars in the bathroom? Yes  Shower chair or bench in shower? No  Elevated toilet seat or a handicapped toilet? No          11/29/2022    1:27 PM 11/27/2021    2:14 PM 11/24/2019    2:57 PM 11/13/2018    2:53 PM  6CIT Screen  What Year? 0 points 0 points 0 points 0 points  What month? 0 points 0 points 0 points 0 points  What time? 0 points 0 points 0 points 0 points  Count back from 20 0 points 0 points 0 points 0 points  Months in reverse 0 points 0 points 2 points 0 points  Repeat phrase 0 points 2 points 2 points 0 points  Total Score 0 points 2 points 4 points 0 points    Immunizations Immunization History  Administered Date(s) Administered   Fluad Quad(high Dose 65+) 05/19/2021, 08/09/2022   PNEUMOCOCCAL CONJUGATE-20 08/09/2022   Pneumococcal Conjugate-13 11/15/2020    TDAP status: Due, Education has been provided regarding the importance of this vaccine. Advised may receive this vaccine at local pharmacy or Health Dept. Aware to provide a copy of the vaccination record if obtained from local pharmacy or Health Dept. Verbalized acceptance and understanding.  Flu Vaccine status: Up to date  Pneumococcal vaccine status: Up to date  Covid-19 vaccine status: Declined, Education has been provided regarding the importance of this vaccine but patient still declined. Advised may receive this vaccine at local pharmacy  or Health Dept.or vaccine clinic. Aware to provide a copy of the vaccination record if obtained from local pharmacy or Health Dept. Verbalized acceptance and understanding.  Qualifies for Shingles Vaccine? Yes   Zostavax completed No   Shingrix  Completed?: No.    Education has been provided regarding the importance of this vaccine. Patient has been advised to call insurance company to determine out of pocket expense if they have not yet received this vaccine. Advised may also receive vaccine at local pharmacy or Health Dept. Verbalized acceptance and understanding.  Screening Tests Health Maintenance  Topic Date Due   COVID-19 Vaccine (1) Never done   DTaP/Tdap/Td (1 - Tdap) Never done   Lung Cancer Screening  Never done   Zoster Vaccines- Shingrix (1 of 2) Never done   INFLUENZA VACCINE  02/07/2023   Fecal DNA (Cologuard)  11/24/2023   Medicare Annual Wellness (AWV)  11/29/2023   Pneumonia Vaccine 40+ Years old  Completed   Hepatitis C Screening  Completed   HPV VACCINES  Aged Out    Health Maintenance  Health Maintenance Due  Topic Date Due   COVID-19 Vaccine (1) Never done   DTaP/Tdap/Td (1 - Tdap) Never done   Lung Cancer Screening  Never done   Zoster Vaccines- Shingrix (1 of 2) Never done    Colorectal cancer screening: Type of screening: Cologuard. Completed 11/23/2020. Repeat every 3 years  Lung Cancer Screening: (Low Dose CT Chest recommended if Age 63-80 years, 30 pack-year currently smoking OR have quit w/in 15years.) does qualify.   Lung Cancer Screening Referral: declined   Additional Screening:  Hepatitis C Screening: does qualify; Completed 11/15/2020  Vision Screening: Recommended annual ophthalmology exams for early detection of glaucoma and other disorders of the eye. Is the patient up to date with their annual eye exam?  Yes  Who is the provider or what is the name of the office in which the patient attends annual eye exams? Dr.Johnson  If pt is not established with a provider, would they like to be referred to a provider to establish care? No .   Dental Screening: Recommended annual dental exams for proper oral hygiene  Community Resource Referral / Chronic Care Management: CRR required this  visit?  No   CCM required this visit?  No      Plan:     I have personally reviewed and noted the following in the patient's chart:   Medical and social history Use of alcohol, tobacco or illicit drugs  Current medications and supplements including opioid prescriptions. Patient is not currently taking opioid prescriptions. Functional ability and status Nutritional status Physical activity Advanced directives List of other physicians Hospitalizations, surgeries, and ER visits in previous 12 months Vitals Screenings to include cognitive, depression, and falls Referrals and appointments  In addition, I have reviewed and discussed with patient certain preventive protocols, quality metrics, and best practice recommendations. A written personalized care plan for preventive services as well as general preventive health recommendations were provided to patient.     Lorrene Reid, LPN   1/61/0960   Nurse Notes: none

## 2022-11-29 NOTE — Patient Instructions (Signed)
Victor Shields , Thank you for taking time to come for your Medicare Wellness Visit. I appreciate your ongoing commitment to your health goals. Please review the following plan we discussed and let me know if I can assist you in the future.   These are the goals we discussed:  Goals      DIET - EAT MORE FRUITS AND VEGETABLES     Exercise 150 min/wk Moderate Activity     Quit Smoking     Down to 1/2 ppd        This is a list of the screening recommended for you and due dates:  Health Maintenance  Topic Date Due   COVID-19 Vaccine (1) Never done   DTaP/Tdap/Td vaccine (1 - Tdap) Never done   Screening for Lung Cancer  Never done   Zoster (Shingles) Vaccine (1 of 2) Never done   Flu Shot  02/07/2023   Cologuard (Stool DNA test)  11/24/2023   Medicare Annual Wellness Visit  11/29/2023   Pneumonia Vaccine  Completed   Hepatitis C Screening: USPSTF Recommendation to screen - Ages 18-79 yo.  Completed   HPV Vaccine  Aged Out    Advanced directives: Advance directive discussed with you today. I have provided a copy for you to complete at home and have notarized. Once this is complete please bring a copy in to our office so we can scan it into your chart.   Conditions/risks identified: Aim for 30 minutes of exercise or brisk walking, 6-8 glasses of water, and 5 servings of fruits and vegetables each day.   Next appointment: Follow up in one year for your annual wellness visit.   Preventive Care 4 Years and Older, Male  Preventive care refers to lifestyle choices and visits with your health care provider that can promote health and wellness. What does preventive care include? A yearly physical exam. This is also called an annual well check. Dental exams once or twice a year. Routine eye exams. Ask your health care provider how often you should have your eyes checked. Personal lifestyle choices, including: Daily care of your teeth and gums. Regular physical activity. Eating a healthy  diet. Avoiding tobacco and drug use. Limiting alcohol use. Practicing safe sex. Taking low doses of aspirin every day. Taking vitamin and mineral supplements as recommended by your health care provider. What happens during an annual well check? The services and screenings done by your health care provider during your annual well check will depend on your age, overall health, lifestyle risk factors, and family history of disease. Counseling  Your health care provider may ask you questions about your: Alcohol use. Tobacco use. Drug use. Emotional well-being. Home and relationship well-being. Sexual activity. Eating habits. History of falls. Memory and ability to understand (cognition). Work and work Astronomer. Screening  You may have the following tests or measurements: Height, weight, and BMI. Blood pressure. Lipid and cholesterol levels. These may be checked every 5 years, or more frequently if you are over 69 years old. Skin check. Lung cancer screening. You may have this screening every year starting at age 41 if you have a 30-pack-year history of smoking and currently smoke or have quit within the past 15 years. Fecal occult blood test (FOBT) of the stool. You may have this test every year starting at age 52. Flexible sigmoidoscopy or colonoscopy. You may have a sigmoidoscopy every 5 years or a colonoscopy every 10 years starting at age 52. Prostate cancer screening. Recommendations will vary  depending on your family history and other risks. Hepatitis C blood test. Hepatitis B blood test. Sexually transmitted disease (STD) testing. Diabetes screening. This is done by checking your blood sugar (glucose) after you have not eaten for a while (fasting). You may have this done every 1-3 years. Abdominal aortic aneurysm (AAA) screening. You may need this if you are a current or former smoker. Osteoporosis. You may be screened starting at age 103 if you are at high risk. Talk with  your health care provider about your test results, treatment options, and if necessary, the need for more tests. Vaccines  Your health care provider may recommend certain vaccines, such as: Influenza vaccine. This is recommended every year. Tetanus, diphtheria, and acellular pertussis (Tdap, Td) vaccine. You may need a Td booster every 10 years. Zoster vaccine. You may need this after age 68. Pneumococcal 13-valent conjugate (PCV13) vaccine. One dose is recommended after age 69. Pneumococcal polysaccharide (PPSV23) vaccine. One dose is recommended after age 71. Talk to your health care provider about which screenings and vaccines you need and how often you need them. This information is not intended to replace advice given to you by your health care provider. Make sure you discuss any questions you have with your health care provider. Document Released: 07/22/2015 Document Revised: 03/14/2016 Document Reviewed: 04/26/2015 Elsevier Interactive Patient Education  2017 [employer].  Fall Prevention in the Home Falls can cause injuries. They can happen to people of all ages. There are many things you can do to make your home safe and to help prevent falls. What can I do on the outside of my home? Regularly fix the edges of walkways and driveways and fix any cracks. Remove anything that might make you trip as you walk through a door, such as a raised step or threshold. Trim any bushes or trees on the path to your home. Use bright outdoor lighting. Clear any walking paths of anything that might make someone trip, such as rocks or tools. Regularly check to see if handrails are loose or broken. Make sure that both sides of any steps have handrails. Any raised decks and porches should have guardrails on the edges. Have any leaves, snow, or ice cleared regularly. Use sand or salt on walking paths during winter. Clean up any spills in your garage right away. This includes oil or grease spills. What  can I do in the bathroom? Use night lights. Install grab bars by the toilet and in the tub and shower. Do not use towel bars as grab bars. Use non-skid mats or decals in the tub or shower. If you need to sit down in the shower, use a plastic, non-slip stool. Keep the floor dry. Clean up any water that spills on the floor as soon as it happens. Remove soap buildup in the tub or shower regularly. Attach bath mats securely with double-sided non-slip rug tape. Do not have throw rugs and other things on the floor that can make you trip. What can I do in the bedroom? Use night lights. Make sure that you have a light by your bed that is easy to reach. Do not use any sheets or blankets that are too big for your bed. They should not hang down onto the floor. Have a firm chair that has side arms. You can use this for support while you get dressed. Do not have throw rugs and other things on the floor that can make you trip. What can I do in the  kitchen? Clean up any spills right away. Avoid walking on wet floors. Keep items that you use a lot in easy-to-reach places. If you need to reach something above you, use a strong step stool that has a grab bar. Keep electrical cords out of the way. Do not use floor polish or wax that makes floors slippery. If you must use wax, use non-skid floor wax. Do not have throw rugs and other things on the floor that can make you trip. What can I do with my stairs? Do not leave any items on the stairs. Make sure that there are handrails on both sides of the stairs and use them. Fix handrails that are broken or loose. Make sure that handrails are as long as the stairways. Check any carpeting to make sure that it is firmly attached to the stairs. Fix any carpet that is loose or worn. Avoid having throw rugs at the top or bottom of the stairs. If you do have throw rugs, attach them to the floor with carpet tape. Make sure that you have a light switch at the top of the  stairs and the bottom of the stairs. If you do not have them, ask someone to add them for you. What else can I do to help prevent falls? Wear shoes that: Do not have high heels. Have rubber bottoms. Are comfortable and fit you well. Are closed at the toe. Do not wear sandals. If you use a stepladder: Make sure that it is fully opened. Do not climb a closed stepladder. Make sure that both sides of the stepladder are locked into place. Ask someone to hold it for you, if possible. Clearly mark and make sure that you can see: Any grab bars or handrails. First and last steps. Where the edge of each step is. Use tools that help you move around (mobility aids) if they are needed. These include: Canes. Walkers. Scooters. Crutches. Turn on the lights when you go into a dark area. Replace any light bulbs as soon as they burn out. Set up your furniture so you have a clear path. Avoid moving your furniture around. If any of your floors are uneven, fix them. If there are any pets around you, be aware of where they are. Review your medicines with your doctor. Some medicines can make you feel dizzy. This can increase your chance of falling. Ask your doctor what other things that you can do to help prevent falls. This information is not intended to replace advice given to you by your health care provider. Make sure you discuss any questions you have with your health care provider. Document Released: 04/21/2009 Document Revised: 12/01/2015 Document Reviewed: 07/30/2014 Elsevier Interactive Patient Education  2017 [employer].

## 2023-01-07 ENCOUNTER — Telehealth: Payer: Self-pay | Admitting: Family

## 2023-01-07 MED ORDER — BREZTRI AEROSPHERE 160-9-4.8 MCG/ACT IN AERO: 2.0000 | INHALATION_SPRAY | Freq: Two times a day (BID) | RESPIRATORY_TRACT | 11 refills | Status: DC

## 2023-01-07 NOTE — Telephone Encounter (Signed)
Breo changed to [facility] Prescription sent to pharmacy

## 2023-01-07 NOTE — Telephone Encounter (Signed)
Pt says that he wants rx fluticasone furoate-vilanterol (BREO ELLIPTA) 200-25 MCG/ACT AEPB  changed to something else. Pt says that when using breo pt feels like he can't swallow and the medicine is not lasting the whole day and night. Pt was on Symbicort before. Use [facility]

## 2023-01-07 NOTE — Telephone Encounter (Signed)
Left message making pt aware. 

## 2023-03-27 ENCOUNTER — Other Ambulatory Visit: Payer: Self-pay | Admitting: Family

## 2023-03-27 DIAGNOSIS — J42 Unspecified chronic bronchitis: Secondary | ICD-10-CM

## 2023-04-03 ENCOUNTER — Other Ambulatory Visit: Payer: Self-pay | Admitting: Family

## 2023-04-03 DIAGNOSIS — J42 Unspecified chronic bronchitis: Secondary | ICD-10-CM

## 2023-04-03 NOTE — Telephone Encounter (Signed)
[name] NTBS 30-d refill sent 03/27/23

## 2023-04-03 NOTE — Telephone Encounter (Signed)
Pt scheduled for med refill appt on 10/11 with PCP and wife is aware.

## 2023-04-19 ENCOUNTER — Ambulatory Visit: Payer: Medicare HMO | Admitting: Family

## 2023-04-22 ENCOUNTER — Ambulatory Visit: Payer: Medicare HMO | Admitting: Family

## 2023-04-22 ENCOUNTER — Encounter: Payer: Self-pay | Admitting: Family

## 2023-04-22 VITALS — BP 130/64 | HR 102 | Temp 97.9°F | Ht 68.0 in | Wt 140.0 lb

## 2023-04-22 DIAGNOSIS — R600 Localized edema: Secondary | ICD-10-CM

## 2023-04-22 DIAGNOSIS — Z23 Encounter for immunization: Secondary | ICD-10-CM | POA: Diagnosis not present

## 2023-04-22 DIAGNOSIS — N529 Male erectile dysfunction, unspecified: Secondary | ICD-10-CM

## 2023-04-22 DIAGNOSIS — J42 Unspecified chronic bronchitis: Secondary | ICD-10-CM | POA: Diagnosis not present

## 2023-04-22 DIAGNOSIS — H65193 Other acute nonsuppurative otitis media, bilateral: Secondary | ICD-10-CM

## 2023-04-22 DIAGNOSIS — E785 Hyperlipidemia, unspecified: Secondary | ICD-10-CM

## 2023-04-22 DIAGNOSIS — Z720 Tobacco use: Secondary | ICD-10-CM

## 2023-04-22 DIAGNOSIS — H938X3 Other specified disorders of ear, bilateral: Secondary | ICD-10-CM

## 2023-04-22 DIAGNOSIS — H6122 Impacted cerumen, left ear: Secondary | ICD-10-CM | POA: Diagnosis not present

## 2023-04-22 MED ORDER — BREZTRI AEROSPHERE 160-9-4.8 MCG/ACT IN AERO
2.0000 | INHALATION_SPRAY | Freq: Two times a day (BID) | RESPIRATORY_TRACT | 11 refills | Status: DC
Start: 2023-04-22 — End: 2024-02-24

## 2023-04-22 MED ORDER — FLUTICASONE PROPIONATE 50 MCG/ACT NA SUSP
2.0000 | Freq: Every day | NASAL | 2 refills | Status: DC
Start: 2023-04-22 — End: 2024-01-02

## 2023-04-22 MED ORDER — ALBUTEROL SULFATE HFA 108 (90 BASE) MCG/ACT IN AERS
2.0000 | INHALATION_SPRAY | Freq: Four times a day (QID) | RESPIRATORY_TRACT | 0 refills | Status: DC | PRN
Start: 2023-04-22 — End: 2023-05-21

## 2023-04-22 MED ORDER — CETIRIZINE HCL 10 MG PO TABS: 10.0000 mg | ORAL_TABLET | Freq: Every day | ORAL | 1 refills | Status: DC

## 2023-04-22 MED ORDER — SILDENAFIL CITRATE 100 MG PO TABS
50.0000 mg | ORAL_TABLET | Freq: Every day | ORAL | 1 refills | Status: DC | PRN
Start: 2023-04-22 — End: 2024-01-02

## 2023-04-22 NOTE — Progress Notes (Addendum)
Subjective:    Patient ID: Victor Shields, male    DOB: 24-Aug-1950, 73 y.o.   MRN: [id number]  Chief Complaint  Patient presents with   Medical Management of Chronic Issues   PT presents to the office today for chronic follow up. He has a hx of left hip fracture in 02/2020. Using cane.    He has COPD and continues to smoke 1/2 pack a day. He is using Breztri BID.  Hyperlipidemia This is a chronic problem. The current episode started more than 1 year ago. The problem is controlled. Recent lipid tests were reviewed and are normal. Current antihyperlipidemic treatment includes diet change. The current treatment provides no improvement of lipids. Risk factors for coronary artery disease include dyslipidemia, hypertension and a sedentary lifestyle.  Nicotine Dependence Presents for follow-up visit. His urge triggers include company of smokers. The symptoms have been stable. He smokes < 1/2 a pack of cigarettes per day.      Review of Systems  All other systems reviewed and are negative.      Objective:   Physical Exam Vitals reviewed.  Constitutional:      General: He is not in acute distress.    Appearance: He is well-developed.  HENT:     Head: Normocephalic.     Right Ear: Tympanic membrane normal.     Left Ear: Tympanic membrane normal. There is impacted cerumen.  Eyes:     General:        Right eye: No discharge.        Left eye: No discharge.     Pupils: Pupils are equal, round, and reactive to light.  Neck:     Thyroid: No thyromegaly.  Cardiovascular:     Rate and Rhythm: Normal rate and regular rhythm.     Heart sounds: Normal heart sounds. No murmur heard. Pulmonary:     Effort: Pulmonary effort is normal. No respiratory distress.     Breath sounds: Rhonchi present. No wheezing.  Abdominal:     General: Bowel sounds are normal. There is no distension.     Palpations: Abdomen is soft.     Tenderness: There is no abdominal tenderness.  Musculoskeletal:         General: No tenderness. Normal range of motion.     Cervical back: Normal range of motion and neck supple.     Right lower leg: Edema (2+) present.     Left lower leg: Edema (3+ in ankles) present.  Skin:    General: Skin is warm and dry.     Findings: No erythema or rash.  Neurological:     Mental Status: He is alert and oriented to person, place, and time.     Cranial Nerves: No cranial nerve deficit.     Deep Tendon Reflexes: Reflexes are normal and symmetric.  Psychiatric:        Behavior: Behavior normal.        Thought Content: Thought content normal.        Judgment: Judgment normal.     Left ear washed with warm water and peroxide, cerumen removed but unable to see TM  BP 130/64   Pulse (!) 102   Temp 97.9 F (36.6 C) (Temporal)   Ht 5\' 8"  (1.727 m)   Wt 140 lb (63.5 kg)   SpO2 92%   BMI 21.29 kg/m      Assessment & Plan:   Victor Shields comes in today with chief complaint of Medical  Management of Chronic Issues   Diagnosis and orders addressed:  1. Chronic bronchitis, unspecified chronic bronchitis type (HCC) - albuterol (VENTOLIN HFA) 108 (90 Base) MCG/ACT inhaler; Inhale 2 puffs into the lungs every 6 (six) hours as needed for wheezing or shortness of breath. **NEEDS TO BE SEEN BEFORE NEXT REFILL**  Dispense: 8.5 each; Refill: 0 - Budeson-Glycopyrrol-Formoterol (BREZTRI AEROSPHERE) 160-9-4.8 MCG/ACT AERO; Inhale 2 puffs into the lungs 2 (two) times daily.  Dispense: 10.7 g; Refill: 11 - CMP14+EGFR - CBC with Differential/Platelet  2. Sensation of fullness in both ears Cerumen removed  - cetirizine (ZYRTEC) 10 MG tablet; Take 1 tablet (10 mg total) by mouth daily.  Dispense: 90 tablet; Refill: 1 - fluticasone (FLONASE) 50 MCG/ACT nasal spray; Place 2 sprays into both nostrils daily.  Dispense: 48 mL; Refill: 2 - CMP14+EGFR - CBC with Differential/Platelet  3. Other acute nonsuppurative otitis media of both ears, recurrence not specified -  CMP14+EGFR - CBC with Differential/Platelet  4. Erectile dysfunction, unspecified erectile dysfunction type - sildenafil (VIAGRA) 100 MG tablet; Take 0.5-1 tablets (50-100 mg total) by mouth daily as needed for erectile dysfunction.  Dispense: 10 tablet; Refill: 1 - CMP14+EGFR - CBC with Differential/Platelet  5. Hyperlipidemia, unspecified hyperlipidemia type - CMP14+EGFR - CBC with Differential/Platelet  6. Tobacco abuse - CMP14+EGFR - CBC with Differential/Platelet  7. Peripheral edema - Compression stockings - CMP14+EGFR - CBC with Differential/Platelet  9. Impacted cerumen, left ear  Labs pending Health Maintenance reviewed Diet and exercise encouraged  Follow up plan: 6 months    Jannifer Rodney, FNP

## 2023-04-22 NOTE — Patient Instructions (Signed)
Peripheral Edema  Peripheral edema is swelling that is caused by a buildup of fluid. Peripheral edema most often affects the lower legs, ankles, and feet. It can also develop in the arms, hands, and face. The area of the body that has peripheral edema will look swollen. It may also feel heavy or warm. Your clothes may start to feel tight. Pressing on the area may make a temporary dent in your skin (pitting edema). You may not be able to move your swollen arm or leg as much as usual. There are many causes of peripheral edema. It can happen because of a complication of other conditions such as heart failure, kidney disease, or a problem with your circulation. It also can be a side effect of certain medicines or happen because of an infection. It often happens to women during pregnancy. Sometimes, the cause is not known. Follow these instructions at home: Managing pain, stiffness, and swelling  Raise (elevate) your legs while you are sitting or lying down. Move around often to prevent stiffness and to reduce swelling. Do not sit or stand for long periods of time. Do not wear tight clothing. Do not wear garters on your upper legs. Exercise your legs to get your circulation going. This helps to move the fluid back into your blood vessels, and it may help the swelling go down. Wear compression stockings as told by your health care provider. These stockings help to prevent blood clots and reduce swelling in your legs. It is important that these are the correct size. These stockings should be prescribed by your doctor to prevent possible injuries. If elastic bandages or wraps are recommended, use them as told by your health care provider. Medicines Take over-the-counter and prescription medicines only as told by your health care provider. Your health care provider may prescribe medicine to help your body get rid of excess water (diuretic). Take this medicine if you are told to take it. General  instructions Eat a low-salt (low-sodium) diet as told by your health care provider. Sometimes, eating less salt may reduce swelling. Pay attention to any changes in your symptoms. Moisturize your skin daily to help prevent skin from cracking and draining. Keep all follow-up visits. This is important. Contact a health care provider if: You have a fever. You have swelling in only one leg. You have increased swelling, redness, or pain in one or both of your legs. You have drainage or sores at the area where you have edema. Get help right away if: You have edema that starts suddenly or is getting worse, especially if you are pregnant or have a medical condition. You develop shortness of breath, especially when you are lying down. You have pain in your chest or abdomen. You feel weak. You feel like you will faint. These symptoms may be an emergency. Get help right away. Call 911. Do not wait to see if the symptoms will go away. Do not drive yourself to the hospital. Summary Peripheral edema is swelling that is caused by a buildup of fluid. Peripheral edema most often affects the lower legs, ankles, and feet. Move around often to prevent stiffness and to reduce swelling. Do not sit or stand for long periods of time. Pay attention to any changes in your symptoms. Contact a health care provider if you have edema that starts suddenly or is getting worse, especially if you are pregnant or have a medical condition. Get help right away if you develop shortness of breath, especially when lying down.  This information is not intended to replace advice given to you by your health care provider. Make sure you discuss any questions you have with your health care provider. Document Revised: 02/27/2021 Document Reviewed: 02/27/2021 Elsevier Patient Education  2024 [employer].

## 2023-04-23 LAB — CMP14+EGFR
ALT: 13 IU/L (ref 0–44)
AST: 23 IU/L (ref 0–40)
Albumin: 3.3 g/dL — ABNORMAL LOW (ref 3.8–4.8)
Alkaline Phosphatase: 102 IU/L (ref 44–121)
BUN/Creatinine Ratio: 13 (ref 10–24)
BUN: 8 mg/dL (ref 8–27)
Bilirubin Total: 1.1 mg/dL (ref 0.0–1.2)
CO2: 23 mmol/L (ref 20–29)
Calcium: 9.4 mg/dL (ref 8.6–10.2)
Chloride: 105 mmol/L (ref 96–106)
Creatinine, Ser: 0.6 mg/dL — ABNORMAL LOW (ref 0.76–1.27)
Globulin, Total: 2.9 g/dL (ref 1.5–4.5)
Glucose: 86 mg/dL (ref 70–99)
Potassium: 4.6 mmol/L (ref 3.5–5.2)
Sodium: 143 mmol/L (ref 134–144)
Total Protein: 6.2 g/dL (ref 6.0–8.5)
eGFR: 102 mL/min/{1.73_m2} (ref >59)

## 2023-04-23 LAB — CBC WITH DIFFERENTIAL/PLATELET
Basophils Absolute: 0.1 10*3/uL (ref 0.0–0.2)
Basos: 1 %
EOS (ABSOLUTE): 0.2 10*3/uL (ref 0.0–0.4)
Eos: 2 %
Hematocrit: 46.5 % (ref 37.5–51.0)
Hemoglobin: 15.8 g/dL (ref 13.0–17.7)
Immature Grans (Abs): 0 10*3/uL (ref 0.0–0.1)
Immature Granulocytes: 0 %
Lymphocytes Absolute: 2.6 10*3/uL (ref 0.7–3.1)
Lymphs: 25 %
MCH: 32.5 pg (ref 26.6–33.0)
MCHC: 34 g/dL (ref 31.5–35.7)
MCV: 96 fL (ref 79–97)
Monocytes Absolute: 1.1 10*3/uL — ABNORMAL HIGH (ref 0.1–0.9)
Monocytes: 11 %
Neutrophils Absolute: 6.3 10*3/uL (ref 1.4–7.0)
Neutrophils: 61 %
Platelets: 358 10*3/uL (ref 150–450)
RBC: 4.86 x10E6/uL (ref 4.14–5.80)
RDW: 12.2 % (ref 11.6–15.4)
WBC: 10.3 10*3/uL (ref 3.4–10.8)

## 2023-05-21 ENCOUNTER — Other Ambulatory Visit: Payer: Self-pay | Admitting: Family

## 2023-05-21 DIAGNOSIS — J42 Unspecified chronic bronchitis: Secondary | ICD-10-CM

## 2023-07-19 ENCOUNTER — Other Ambulatory Visit: Payer: Self-pay | Admitting: Family

## 2023-07-19 DIAGNOSIS — J42 Unspecified chronic bronchitis: Secondary | ICD-10-CM

## 2023-07-25 ENCOUNTER — Telehealth: Payer: Self-pay | Admitting: Family

## 2023-07-25 ENCOUNTER — Other Ambulatory Visit: Payer: Self-pay | Admitting: Family

## 2023-07-25 DIAGNOSIS — J42 Unspecified chronic bronchitis: Secondary | ICD-10-CM

## 2023-07-25 MED ORDER — ALBUTEROL SULFATE HFA 108 (90 BASE) MCG/ACT IN AERS: 2.0000 | INHALATION_SPRAY | Freq: Four times a day (QID) | RESPIRATORY_TRACT | 2 refills | Status: DC | PRN

## 2023-07-25 NOTE — Telephone Encounter (Signed)
Sent in for patient he is aware

## 2023-07-25 NOTE — Telephone Encounter (Signed)
Copied from CRM [id number]. Topic: Appointments - Appointment Scheduling >> Jul 25, 2023  4:31 PM Lovey Newcomer R wrote: Per Dr. Lendon Colonel, pt needs an appt before refilling albuterol (VENTOLIN HFA) 108 (90 Base) MCG/ACT inhaler. Pt is not willing to have an in person or a video visit. Please f/u and advise if the dr is willing to do a telephone visit to do refill. [phone]

## 2023-08-01 ENCOUNTER — Other Ambulatory Visit: Payer: Self-pay | Admitting: Family

## 2023-08-01 DIAGNOSIS — J42 Unspecified chronic bronchitis: Secondary | ICD-10-CM

## 2023-08-01 NOTE — Telephone Encounter (Signed)
Lm on vm stating medication was already called in and to reach out to pharmacy. LS

## 2023-08-01 NOTE — Telephone Encounter (Signed)
Copied from CRM [id number]. Topic: Clinical - Medication Refill >> Aug 01, 2023 10:37 AM Nada Libman H wrote: Most Recent Primary Care Visit:  Provider: Jannifer Rodney A  Department: [facility]  Visit Type: OFFICE VISIT  Date: 04/22/2023  Medication: albuterol (VENTOLIN HFA) 108 (90 Base) MCG/ACT inhaler [[id number]]  Has the patient contacted their pharmacy? Yes (Agent: If no, request that the patient contact the pharmacy for the refill. If patient does not wish to contact the pharmacy document the reason why and proceed with request.) (Agent: If yes, when and what did the pharmacy advise?)  Is this the correct pharmacy for this prescription? Yes If no, delete pharmacy and type the correct one.  This is the patient's preferred pharmacy:  [id number] - [place], [place] - [address] [place] [address] Phone: [phone] Fax: [phone]   Has the prescription been filled recently? No  Is the patient out of the medication? Yes  Has the patient been seen for an appointment in the last year OR does the patient have an upcoming appointment? No  Can we respond through MyChart? No  Agent: Please be advised that Rx refills may take up to 3 business days. We ask that you follow-up with your pharmacy.

## 2023-10-11 ENCOUNTER — Other Ambulatory Visit: Payer: Self-pay

## 2023-10-11 DIAGNOSIS — J42 Unspecified chronic bronchitis: Secondary | ICD-10-CM

## 2023-10-11 MED ORDER — ALBUTEROL SULFATE HFA 108 (90 BASE) MCG/ACT IN AERS: 2.0000 | INHALATION_SPRAY | Freq: Four times a day (QID) | RESPIRATORY_TRACT | 2 refills | Status: DC | PRN

## 2023-10-11 NOTE — Telephone Encounter (Signed)
 Copied from CRM [id number]. Topic: Clinical - Medication Refill >> Oct 11, 2023  8:54 AM Victor Shields H wrote: Most Recent Primary Care Visit:   Medication: albuterol (VENTOLIN HFA) 108 (90 Base) MCG/ACT inhaler  Has the patient contacted their pharmacy? Yes (Agent: If no, request that the patient contact the pharmacy for the refill. If patient does not wish to contact the pharmacy document the reason why and proceed with request.) (Agent: If yes, when and what did the pharmacy advise?)  Is this the correct pharmacy for this prescription? Yes If no, delete pharmacy and type the correct one.  This is the patient's preferred pharmacy:  [id number] - [place], [place] - [address] [place] [address] Phone: [phone] Fax: [phone]   Has the prescription been filled recently? Yes  Is the patient out of the medication? Yes  Has the patient been seen for an appointment in the last year OR does the patient have an upcoming appointment? Yes  Can we respond through MyChart? Yes  Agent: Please be advised that Rx refills may take up to 3 business days. We ask that you follow-up with your pharmacy.

## 2023-10-11 NOTE — Telephone Encounter (Signed)
 Refill sent in. Pharmacy will notify. LS

## 2023-10-11 NOTE — Telephone Encounter (Signed)
 Copied from CRM [id number]. Topic: Clinical - Medical Advice >> Oct 11, 2023  8:52 AM Victor Shields wrote: Reason for CRM: Pt's wife, [name] called in asking if Dr.[name] could send in the fluid pill that they have discussed in the past or what should they do about the swelling in his feet. Says she would like for a nurse to give them a call.

## 2023-10-14 ENCOUNTER — Ambulatory Visit: Payer: Self-pay

## 2023-10-14 NOTE — Telephone Encounter (Signed)
 Copied from CRM [id number]. Topic: Clinical - Red Word Triage >> Oct 14, 2023  9:23 AM Fuller Mandril wrote: Red Word that prompted transfer to Nurse Triage: Legs and feet swelling   Chief Complaint: leg swelling Symptoms: edema to bilateral feet and ankles, edema to one calf, pitting edema, weeping fluid intermittently, SOB more than usual, coughs up some phlegm, increased use of rescue inhaler Frequency: continual Pertinent Negatives: Patient wife denies redness, pain more than usual, lesions, fever, chest pain Disposition: [] 911 / [x] ED /[] Urgent Care (no appt availability in office) / [] Appointment(In office/virtual)/ []  [facility]/ [] Home Care/ [] Refused Recommended Disposition /[] [facility]/ []  Follow-up with PCP Additional Notes: Pt wife is poor historian. Pt wife reporting that pt has hx leg edema on and off, currently occurring with being "in the heat" this past week, bilateral feet and ankles are puffy, then swelling goes to calf on leg that had hip surgery, confirms pitting edema and weeping fluid after full day of swelling, swelling goes down at night. Pt wife reporting no more pain than usual, been having more SOB lately, wife states "don't think breztri does as good" since recent switch to that med, been using albuterol inhaler more since before the heat but confirms been using inhaler even more "with the heat," using 3x/day that she knows of. Pt wife confirms SOB with walking. Advised pt go to ED for symptoms, wife states she'll tell him the recommendation, "don't know if he'll go or not." Advised be examined in next 4 hours at PCP or UC if refusing ED. Pt wife verbalized understanding. Please advise.  Reason for Disposition  [1] Difficulty breathing with exertion (e.g., walking) AND [2] new-onset or worsening  Answer Assessment - Initial Assessment Questions 1. ONSET: "When did the swelling start?" (e.g., minutes, hours, days)     Been on and off, then got out in  the heat the past week 2. LOCATION: "What part of the leg is swollen?"  "Are both legs swollen or just one leg?"     Both feet really puffy during day, at night they go down, just ankles, one leg had hip surgery on goes maybe to his calf 3. SEVERITY: "How bad is the swelling?" (e.g., localized; mild, moderate, severe)   - Localized: Small area of swelling localized to one leg.   - MILD pedal edema: Swelling limited to foot and ankle, pitting edema < 1/4 inch (6 mm) deep, rest and elevation eliminate most or all swelling.   - MODERATE edema: Swelling of lower leg to knee, pitting edema > 1/4 inch (6 mm) deep, rest and elevation only partially reduce swelling.   - SEVERE edema: Swelling extends above knee, facial or hand swelling present.      Indent stays for a little while, no weeping today, weeps at night after full day of swelling 4. REDNESS: "Does the swelling look red or infected?"     Sometimes he'll hit his leg, fluid will come out 5. PAIN: "Is the swelling painful to touch?" If Yes, ask: "How painful is it?"   (Scale 1-10; mild, moderate or severe)     No, some pain like normal, he'll complain feet hurt but has for a while even without swelling 6. FEVER: "Do you have a fever?" If Yes, ask: "What is it, how was it measured, and when did it start?"      no 8. MEDICAL HISTORY: "Do you have a history of blood clots (e.g., DVT), cancer, heart failure, kidney  disease, or liver failure?"     no 9. RECURRENT SYMPTOM: "Have you had leg swelling before?" If Yes, ask: "When was the last time?" "What happened that time?"     Last time last year doc was going to send fluid pill, never got it or took it Most of time it's in heat of summer 10. OTHER SYMPTOMS: "Do you have any other symptoms?" (e.g., chest pain, difficulty breathing)       No, maybe using inhalers a little more with more hot and humid Inhaler 3x a day that she knows of Just because of the heat When he's out moving around,  walking Think breztri More trouble breathing since before the heat, don't think breztri does as good, but using even more with the heat, sometimes coughs up phlegm, always done that Advised ED, don't know if he'll go or not, tell him or  Protocols used: Leg Swelling and Edema-A-AH

## 2023-10-14 NOTE — Telephone Encounter (Signed)
Patient denied appt.

## 2023-10-14 NOTE — Telephone Encounter (Signed)
 If patient has not gone to ED, I have openings and can see him today.

## 2023-12-03 ENCOUNTER — Ambulatory Visit (INDEPENDENT_AMBULATORY_CARE_PROVIDER_SITE_OTHER): Payer: Medicare HMO

## 2023-12-03 VITALS — BP 130/64 | HR 102 | Ht 68.0 in | Wt 140.0 lb

## 2023-12-03 DIAGNOSIS — Z0000 Encounter for general adult medical examination without abnormal findings: Secondary | ICD-10-CM

## 2023-12-03 DIAGNOSIS — Z1211 Encounter for screening for malignant neoplasm of colon: Secondary | ICD-10-CM

## 2023-12-03 NOTE — Patient Instructions (Signed)
 Victor Shields , Thank you for taking time out of your busy schedule to complete your Annual Wellness Visit with me. I enjoyed our conversation and look forward to speaking with you again next year. I, as well as your care team,  appreciate your ongoing commitment to your health goals. Please review the following plan we discussed and let me know if I can assist you in the future. Your Game plan/ To Do List   Follow up Visits: Next Medicare AWV with our clinical staff: 12/03/24 at  2:30p.m. Next Office Visit with your provider: n/a  Clinician Recommendations:  Aim for 30 minutes of exercise or brisk walking, 6-8 glasses of water , and 5 servings of fruits and vegetables each day. Patient is recommended to update vaccines and have cologuard done.       This is a list of the screening recommended for you and due dates:  Health Maintenance  Topic Date Due   DTaP/Tdap/Td vaccine (1 - Tdap) Never done   Zoster (Shingles) Vaccine (1 of 2) Never done   Cologuard (Stool DNA test)  11/24/2023   Screening for Lung Cancer  04/21/2024*   COVID-19 Vaccine (1 - 2024-25 season) 12/18/2024*   Flu Shot  02/07/2024   Medicare Annual Wellness Visit  12/02/2024   Pneumonia Vaccine  Completed   Hepatitis C Screening  Completed   HPV Vaccine  Aged Out   Meningitis B Vaccine  Aged Out  *Topic was postponed. The date shown is not the original due date.    Advanced directives: (Declined) Advance directive discussed with you today. Even though you declined this today, please call our office should you change your mind, and we can give you the proper paperwork for you to fill out. Advance Care Planning is important because it:  [x]  Makes sure you receive the medical care that is consistent with your values, goals, and preferences  [x]  It provides guidance to your family and loved ones and reduces their decisional burden about whether or not they are making the right decisions based on your wishes.  Follow the link  provided in your after visit summary or read over the paperwork we have mailed to you to help you started getting your Advance Directives in place. If you need assistance in completing these, please reach out to us  so that we can help you!  See attachments for Preventive Care and Fall Prevention Tips.

## 2023-12-03 NOTE — Progress Notes (Signed)
 Subjective:   Victor Shields is a 73 y.o. who presents for a Medicare Wellness preventive visit.  As a reminder, Annual Wellness Visits don't include a physical exam, and some assessments may be limited, especially if this visit is performed virtually. We may recommend an in-person follow-up visit with your provider if needed.  Visit Complete: Virtual I connected with  Victor Shields on 12/03/23 by a audio enabled telemedicine application and verified that I am speaking with the correct person using two identifiers.  Patient Location: Home  Provider Location: Home Office  I discussed the limitations of evaluation and management by telemedicine. The patient expressed understanding and agreed to proceed.  Vital Signs: Because this visit was a virtual/telehealth visit, some criteria may be missing or patient reported. Any vitals not documented were not able to be obtained and vitals that have been documented are patient reported.  VideoDeclined- This patient declined Librarian, academic. Therefore the visit was completed with audio only.  Persons Participating in Visit: Patient.  AWV Questionnaire: No: Patient Medicare AWV questionnaire was not completed prior to this visit.  Cardiac Risk Factors include: advanced age (>27men, >60 women);dyslipidemia;hypertension;male gender     Objective:     Today's Vitals   12/03/23 1355  BP: 130/64  Pulse: (!) 102  Weight: 140 lb (63.5 kg)  Height: 5\' 8"  (1.727 m)   Body mass index is 21.29 kg/m.     12/03/2023    2:04 PM 11/29/2022    1:27 PM 11/27/2021    2:12 PM 11/24/2020    5:20 PM 02/15/2020    7:55 PM 11/24/2019    2:40 PM 11/13/2018    2:51 PM  Advanced Directives  Does Patient Have a Medical Advance Directive? No No No No No No No  Would patient like information on creating a medical advance directive?  No - Patient declined No - Patient declined Yes (MAU/Ambulatory/Procedural Areas -  Information given) No - Patient declined No - Patient declined Yes (MAU/Ambulatory/Procedural Areas - Information given)    Current Medications (verified) Outpatient Encounter Medications as of 12/03/2023  Medication Sig   albuterol  (VENTOLIN  HFA) 108 (90 Base) MCG/ACT inhaler Inhale 2 puffs into the lungs every 6 (six) hours as needed for wheezing or shortness of breath.   Budeson-Glycopyrrol-Formoterol  (BREZTRI  AEROSPHERE) 160-9-4.8 MCG/ACT AERO Inhale 2 puffs into the lungs 2 (two) times daily.   cetirizine  (ZYRTEC ) 10 MG tablet Take 1 tablet (10 mg total) by mouth daily.   fluticasone  (FLONASE ) 50 MCG/ACT nasal spray Place 2 sprays into both nostrils daily.   sildenafil  (VIAGRA ) 100 MG tablet Take 0.5-1 tablets (50-100 mg total) by mouth daily as needed for erectile dysfunction.   trolamine salicylate (ASPERCREME) 10 % cream Apply 1 application topically as needed for muscle pain.   No facility-administered encounter medications on file as of 12/03/2023.    Allergies (verified) Penicillins   History: Past Medical History:  Diagnosis Date   Gout    Past Surgical History:  Procedure Laterality Date   CATARACT EXTRACTION     COMPRESSION HIP SCREW Left 02/17/2020   Procedure: OPEN TREATMENT INTERNAL FIXATION LEFT HIP;  Surgeon: Darrin Emerald, MD;  Location: [facility];  Service: Orthopedics;  Laterality: Left;   History reviewed. No pertinent family history. Social History   Socioeconomic History   Marital status: Married    Spouse name: Abe Abed   Number of children: 1   Years of education: Not on file   Highest education  level: 9th grade  Occupational History   Occupation: Retired    Associate Professor: [employer]  Tobacco Use   Smoking status: Every Day    Current packs/day: 0.50    Average packs/day: 0.5 packs/day for 48.4 years (24.2 ttl pk-yrs)    Types: Cigarettes    Start date: 07/10/1975   Smokeless tobacco: Never  Vaping Use   Vaping status: Never Used   Substance and Sexual Activity   Alcohol use: Yes    Alcohol/week: 24.0 standard drinks of alcohol    Types: 24 Standard drinks or equivalent per week    Comment: "couple of beers daily"   Drug use: No   Sexual activity: Not Currently  Other Topics Concern   Not on file  Social History Narrative   Lives at home with wife - one level living   Social Drivers of Health   Financial Resource Strain: Low Risk  (12/03/2023)   Overall Financial Resource Strain (CARDIA)    Difficulty of Paying Living Expenses: Not hard at all  Food Insecurity: No Food Insecurity (12/03/2023)   Hunger Vital Sign    Worried About Running Out of Food in the Last Year: Never true    Ran Out of Food in the Last Year: Never true  Transportation Needs: No Transportation Needs (12/03/2023)   PRAPARE - Administrator, Civil Service (Medical): No    Lack of Transportation (Non-Medical): No  Physical Activity: Insufficiently Active (12/03/2023)   Exercise Vital Sign    Days of Exercise per Week: 3 days    Minutes of Exercise per Session: 20 min  Stress: No Stress Concern Present (12/03/2023)   [employer] of Occupational Health - Occupational Stress Questionnaire    Feeling of Stress : Not at all  Social Connections: Moderately Isolated (12/03/2023)   Social Connection and Isolation Panel [NHANES]    Frequency of Communication with Friends and Family: More than three times a week    Frequency of Social Gatherings with Friends and Family: More than three times a week    Attends Religious Services: Never    Database administrator or Organizations: No    Attends Engineer, structural: Never    Marital Status: Married    Tobacco Counseling Ready to quit: Not Answered Counseling given: Yes    Clinical Intake:  Pre-visit preparation completed: Yes  Pain : No/denies pain     BMI - recorded: 21.29 Nutritional Status: BMI of 19-24  Normal Nutritional Risks: None Diabetes: No  Lab  Results  Component Value Date   HGBA1C 4.5 (L) 02/15/2020     How often do you need to have someone help you when you read instructions, pamphlets, or other written materials from your doctor or pharmacy?: 1 - Never  Interpreter Needed?: No  Information entered by :: Alia T/cma   Activities of Daily Living     12/03/2023    2:00 PM  In your present state of health, do you have any difficulty performing the following activities:  Hearing? 1  Comment pt wears hearing aid  Vision? 0  Comment pt goes to [facility] [address] in [facility]  Difficulty concentrating or making decisions? 0  Walking or climbing stairs? 0  Dressing or bathing? 0  Doing errands, shopping? 1  Comment pt's wife help  Preparing Food and eating ? N  Using the Toilet? N  In the past six months, have you accidently leaked urine? N  Do you have problems with loss  of bowel control? N  Managing your Medications? Y  Comment pt's wife  Managing your Finances? Y  Comment pt's wife  Housekeeping or managing your Housekeeping? N    Patient Care Team: Yevette Hem, FNP as PCP - General (Nurse Practitioner)  Indicate any recent Medical Services you may have received from other than [facility] providers in the past year (date may be approximate).     Assessment:    This is a routine wellness examination for Alister.  Hearing/Vision screen Hearing Screening - Comments:: Pt wears hearing aids Vision Screening - Comments:: pt goes to [facility] [address] in [facility]   Goals Addressed             This Visit's Progress    DIET - EAT MORE FRUITS AND VEGETABLES   On track      Depression Screen     12/03/2023    2:07 PM 11/29/2022    1:26 PM 08/09/2022   12:02 PM 11/30/2021    8:10 AM 11/27/2021    2:10 PM 08/18/2021    8:18 AM 11/24/2020    5:16 PM  PHQ 2/9 Scores  PHQ - 2 Score 0 0 0 0 0 0 0  PHQ- 9 Score 2  0   0     Fall Risk     12/03/2023    2:03 PM 11/29/2022    1:25 PM 08/09/2022   12:02 PM 11/30/2021    8:09 AM  11/27/2021    2:07 PM  Fall Risk   Falls in the past year? 0 0 0 0 0  Number falls in past yr: 0 0   0  Injury with Fall? 0 0   0  Risk for fall due to : No Fall Risks No Fall Risks   Impaired balance/gait;Orthopedic patient  Follow up Falls evaluation completed Falls prevention discussed  Falls evaluation completed Education provided;Falls prevention discussed    MEDICARE RISK AT HOME:  Medicare Risk at Home Any stairs in or around the home?: Yes If so, are there any without handrails?: Yes Home free of loose throw rugs in walkways, pet beds, electrical cords, etc?: Yes Adequate lighting in your home to reduce risk of falls?: Yes Life alert?: No Use of a cane, walker or w/c?: Yes (uses a cane) Grab bars in the bathroom?: Yes Shower chair or bench in shower?: No Elevated toilet seat or a handicapped toilet?: Yes  TIMED UP AND GO:  Was the test performed?  no  Cognitive Function: 6CIT completed        12/03/2023    2:10 PM 11/29/2022    1:27 PM 11/27/2021    2:14 PM 11/24/2019    2:57 PM 11/13/2018    2:53 PM  6CIT Screen  What Year? 0 points 0 points 0 points 0 points 0 points  What month? 0 points 0 points 0 points 0 points 0 points  What time? 0 points 0 points 0 points 0 points 0 points  Count back from 20 0 points 0 points 0 points 0 points 0 points  Months in reverse 0 points 0 points 0 points 2 points 0 points  Repeat phrase 0 points 0 points 2 points 2 points 0 points  Total Score 0 points 0 points 2 points 4 points 0 points    Immunizations Immunization History  Administered Date(s) Administered   Fluad Quad(high Dose 65+) 05/19/2021, 08/09/2022   Fluad Trivalent(High Dose 65+) 04/22/2023   PNEUMOCOCCAL CONJUGATE-20 08/09/2022   Pneumococcal Conjugate-13  11/15/2020    Screening Tests Health Maintenance  Topic Date Due   DTaP/Tdap/Td (1 - Tdap) Never done   Zoster Vaccines- Shingrix (1 of 2) Never done   Fecal DNA (Cologuard)  11/24/2023   Lung Cancer  Screening  04/21/2024 (Originally 10/04/1999)   COVID-19 Vaccine (1 - 2024-25 season) 12/18/2024 (Originally 03/10/2023)   INFLUENZA VACCINE  02/07/2024   Medicare Annual Wellness (AWV)  12/02/2024   Pneumonia Vaccine 52+ Years old  Completed   Hepatitis C Screening  Completed   HPV VACCINES  Aged Out   Meningococcal B Vaccine  Aged Out    Health Maintenance  Health Maintenance Due  Topic Date Due   DTaP/Tdap/Td (1 - Tdap) Never done   Zoster Vaccines- Shingrix (1 of 2) Never done   Fecal DNA (Cologuard)  11/24/2023   Health Maintenance Items Addressed: See Nurse Notes  Additional Screening:  Vision Screening: Recommended annual ophthalmology exams for early detection of glaucoma and other disorders of the eye.  Dental Screening: Recommended annual dental exams for proper oral hygiene  Community Resource Referral / Chronic Care Management: CRR required this visit?  No   CCM required this visit?  No   Plan:    I have personally reviewed and noted the following in the patient's chart:   Medical and social history Use of alcohol, tobacco or illicit drugs  Current medications and supplements including opioid prescriptions. Patient is not currently taking opioid prescriptions. Functional ability and status Nutritional status Physical activity Advanced directives List of other physicians Hospitalizations, surgeries, and ER visits in previous 12 months Vitals Screenings to include cognitive, depression, and falls Referrals and appointments  In addition, I have reviewed and discussed with patient certain preventive protocols, quality metrics, and best practice recommendations. A written personalized care plan for preventive services as well as general preventive health recommendations were provided to patient.   Michaelle Adolphus, CMA   12/03/2023   After Visit Summary: (MyChart) Due to this being a telephonic visit, the after visit summary with patients personalized plan was  offered to patient via MyChart   Notes: Patient is recommended to update vaccines and have cologuard done. Cologuard ordered place.

## 2023-12-10 DIAGNOSIS — Z1211 Encounter for screening for malignant neoplasm of colon: Secondary | ICD-10-CM | POA: Diagnosis not present

## 2023-12-17 LAB — COLOGUARD: COLOGUARD: NEGATIVE

## 2023-12-19 ENCOUNTER — Ambulatory Visit: Payer: Self-pay | Admitting: Family

## 2024-01-01 ENCOUNTER — Other Ambulatory Visit: Payer: Self-pay | Admitting: Family

## 2024-01-01 DIAGNOSIS — J42 Unspecified chronic bronchitis: Secondary | ICD-10-CM

## 2024-01-01 NOTE — Telephone Encounter (Signed)
 [facility] pt NTBS 30-d given 01/01/24

## 2024-01-01 NOTE — Telephone Encounter (Signed)
 Appt scheduled for 01/02/2024

## 2024-01-02 ENCOUNTER — Ambulatory Visit (INDEPENDENT_AMBULATORY_CARE_PROVIDER_SITE_OTHER): Admitting: Family

## 2024-01-02 ENCOUNTER — Encounter: Payer: Self-pay | Admitting: Family

## 2024-01-02 VITALS — BP 133/62 | HR 94 | Ht 68.0 in | Wt 139.0 lb

## 2024-01-02 DIAGNOSIS — E785 Hyperlipidemia, unspecified: Secondary | ICD-10-CM

## 2024-01-02 DIAGNOSIS — R600 Localized edema: Secondary | ICD-10-CM | POA: Diagnosis not present

## 2024-01-02 DIAGNOSIS — N529 Male erectile dysfunction, unspecified: Secondary | ICD-10-CM

## 2024-01-02 DIAGNOSIS — Z8781 Personal history of (healed) traumatic fracture: Secondary | ICD-10-CM

## 2024-01-02 DIAGNOSIS — Z720 Tobacco use: Secondary | ICD-10-CM | POA: Diagnosis not present

## 2024-01-02 DIAGNOSIS — J42 Unspecified chronic bronchitis: Secondary | ICD-10-CM

## 2024-01-02 DIAGNOSIS — H938X3 Other specified disorders of ear, bilateral: Secondary | ICD-10-CM | POA: Diagnosis not present

## 2024-01-02 DIAGNOSIS — Z0000 Encounter for general adult medical examination without abnormal findings: Secondary | ICD-10-CM | POA: Diagnosis not present

## 2024-01-02 DIAGNOSIS — Z23 Encounter for immunization: Secondary | ICD-10-CM

## 2024-01-02 LAB — CMP14+EGFR

## 2024-01-02 LAB — LIPID PANEL

## 2024-01-02 LAB — TSH

## 2024-01-02 MED ORDER — ALBUTEROL SULFATE HFA 108 (90 BASE) MCG/ACT IN AERS
2.0000 | INHALATION_SPRAY | Freq: Four times a day (QID) | RESPIRATORY_TRACT | 0 refills | Status: DC | PRN
Start: 2024-01-02 — End: 2024-02-03

## 2024-01-02 MED ORDER — SILDENAFIL CITRATE 100 MG PO TABS: 50.0000 mg | ORAL_TABLET | Freq: Every day | ORAL | 1 refills | Status: DC | PRN

## 2024-01-02 MED ORDER — CETIRIZINE HCL 10 MG PO TABS: 10.0000 mg | ORAL_TABLET | Freq: Every day | ORAL | 1 refills | Status: DC

## 2024-01-02 MED ORDER — FUROSEMIDE 20 MG PO TABS: 20.0000 mg | ORAL_TABLET | Freq: Every day | ORAL | 1 refills | Status: DC

## 2024-01-02 MED ORDER — FLUTICASONE PROPIONATE 50 MCG/ACT NA SUSP: 2.0000 | Freq: Every day | NASAL | 2 refills | Status: DC

## 2024-01-02 NOTE — Patient Instructions (Signed)
 Peripheral Edema  Peripheral edema is swelling that is caused by a buildup of fluid. Peripheral edema most often affects the lower legs, ankles, and feet. It can also develop in the arms, hands, and face. The area of the body that has peripheral edema will look swollen. It may also feel heavy or warm. Your clothes may start to feel tight. Pressing on the area may make a temporary dent in your skin (pitting edema). You may not be able to move your swollen arm or leg as much as usual. There are many causes of peripheral edema. It can happen because of a complication of other conditions such as heart failure, kidney disease, or a problem with your circulation. It also can be a side effect of certain medicines or happen because of an infection. It often happens to women during pregnancy. Sometimes, the cause is not known. Follow these instructions at home: Managing pain, stiffness, and swelling  Raise (elevate) your legs while you are sitting or lying down. Move around often to prevent stiffness and to reduce swelling. Do not sit or stand for long periods of time. Do not wear tight clothing. Do not wear garters on your upper legs. Exercise your legs to get your circulation going. This helps to move the fluid back into your blood vessels, and it may help the swelling go down. Wear compression stockings as told by your health care provider. These stockings help to prevent blood clots and reduce swelling in your legs. It is important that these are the correct size. These stockings should be prescribed by your doctor to prevent possible injuries. If elastic bandages or wraps are recommended, use them as told by your health care provider. Medicines Take over-the-counter and prescription medicines only as told by your health care provider. Your health care provider may prescribe medicine to help your body get rid of excess water (diuretic). Take this medicine if you are told to take it. General  instructions Eat a low-salt (low-sodium) diet as told by your health care provider. Sometimes, eating less salt may reduce swelling. Pay attention to any changes in your symptoms. Moisturize your skin daily to help prevent skin from cracking and draining. Keep all follow-up visits. This is important. Contact a health care provider if: You have a fever. You have swelling in only one leg. You have increased swelling, redness, or pain in one or both of your legs. You have drainage or sores at the area where you have edema. Get help right away if: You have edema that starts suddenly or is getting worse, especially if you are pregnant or have a medical condition. You develop shortness of breath, especially when you are lying down. You have pain in your chest or abdomen. You feel weak. You feel like you will faint. These symptoms may be an emergency. Get help right away. Call 911. Do not wait to see if the symptoms will go away. Do not drive yourself to the hospital. Summary Peripheral edema is swelling that is caused by a buildup of fluid. Peripheral edema most often affects the lower legs, ankles, and feet. Move around often to prevent stiffness and to reduce swelling. Do not sit or stand for long periods of time. Pay attention to any changes in your symptoms. Contact a health care provider if you have edema that starts suddenly or is getting worse, especially if you are pregnant or have a medical condition. Get help right away if you develop shortness of breath, especially when lying down.  This information is not intended to replace advice given to you by your health care provider. Make sure you discuss any questions you have with your health care provider. Document Revised: 02/27/2021 Document Reviewed: 02/27/2021 Elsevier Patient Education  2024 [employer].

## 2024-01-02 NOTE — Progress Notes (Signed)
 Subjective:    Patient ID: Victor Shields, male    DOB: Nov 27, 1950, 73 y.o.   MRN: [id number]  Chief Complaint  Patient presents with   Medical Management of Chronic Issues   Hyperlipidemia   Edema    feet   Emphysema   PT presents to the office today for CPE and chronic follow up. He has a hx of left hip fracture in 02/2020. Using cane.    He has COPD and continues to smoke 1/4 pack a day. He is using Breztri  BID.   Has ED and uses viagra  as needed.  Complaining of bilateral edema.   Hyperlipidemia This is a chronic problem. The current episode started more than 1 year ago. The problem is controlled. Recent lipid tests were reviewed and are normal. Current antihyperlipidemic treatment includes diet change. The current treatment provides no improvement of lipids. Risk factors for coronary artery disease include dyslipidemia, hypertension and a sedentary lifestyle.  Nicotine  Dependence Presents for follow-up visit. The symptoms have been stable. He smokes < 1/2 a pack of cigarettes per day.      Review of Systems  All other systems reviewed and are negative.      Objective:   Physical Exam Vitals reviewed.  Constitutional:      General: He is not in acute distress.    Appearance: He is well-developed.  HENT:     Head: Normocephalic.     Right Ear: Tympanic membrane normal.     Left Ear: Tympanic membrane normal. There is no impacted cerumen.   Eyes:     General:        Right eye: No discharge.        Left eye: No discharge.     Pupils: Pupils are equal, round, and reactive to light.   Neck:     Thyroid : No thyromegaly.   Cardiovascular:     Rate and Rhythm: Normal rate and regular rhythm.     Heart sounds: Normal heart sounds. No murmur heard. Pulmonary:     Effort: Pulmonary effort is normal. No respiratory distress.     Breath sounds: Decreased air movement present. Wheezing present.  Abdominal:     General: Bowel sounds are normal. There is no  distension.     Palpations: Abdomen is soft.     Tenderness: There is no abdominal tenderness.   Musculoskeletal:        General: No tenderness. Normal range of motion.     Cervical back: Normal range of motion and neck supple.     Right lower leg: Edema (3+) present.     Left lower leg: Edema (3+ in ankles) present.   Skin:    General: Skin is warm and dry.     Findings: No erythema or rash.   Neurological:     Mental Status: He is alert and oriented to person, place, and time.     Cranial Nerves: No cranial nerve deficit.     Deep Tendon Reflexes: Reflexes are normal and symmetric.   Psychiatric:        Behavior: Behavior normal.        Thought Content: Thought content normal.        Judgment: Judgment normal.      BP 133/62   Pulse 94   Ht 5' 8 (1.727 m)   Wt 139 lb (63 kg)   SpO2 98%   BMI 21.13 kg/m      Assessment & Plan:   Janine Alm  Sally comes in today with chief complaint of Medical Management of Chronic Issues, Hyperlipidemia, Edema (feet), and Emphysema   Diagnosis and orders addressed:  1. Chronic bronchitis, unspecified chronic bronchitis type (HCC) - albuterol  (VENTOLIN  HFA) 108 (90 Base) MCG/ACT inhaler; Inhale 2 puffs into the lungs every 6 (six) hours as needed for wheezing or shortness of breath. **NEEDS TO BE SEEN BEFORE NEXT REFILL**  Dispense: 18 each; Refill: 0 - CMP14+EGFR - CBC with Differential/Platelet  2. Sensation of fullness in both ears - cetirizine  (ZYRTEC ) 10 MG tablet; Take 1 tablet (10 mg total) by mouth daily.  Dispense: 90 tablet; Refill: 1 - fluticasone  (FLONASE ) 50 MCG/ACT nasal spray; Place 2 sprays into both nostrils daily.  Dispense: 48 mL; Refill: 2 - CMP14+EGFR - CBC with Differential/Platelet  3. Erectile dysfunction, unspecified erectile dysfunction type - sildenafil  (VIAGRA ) 100 MG tablet; Take 0.5-1 tablets (50-100 mg total) by mouth daily as needed for erectile dysfunction.  Dispense: 10 tablet; Refill: 1 -  CMP14+EGFR - CBC with Differential/Platelet  4. Encounter for immunization - Tdap vaccine greater than or equal to 7yo IM - CMP14+EGFR - CBC with Differential/Platelet  5. Annual physical exam (Primary) - CMP14+EGFR - CBC with Differential/Platelet - Lipid panel - TSH - Brain natriuretic peptide  6. History of hip fracture - CMP14+EGFR - CBC with Differential/Platelet  7. Tobacco abuse - CMP14+EGFR - CBC with Differential/Platelet  8. Hyperlipidemia, unspecified hyperlipidemia type - CMP14+EGFR - CBC with Differential/Platelet  9. Peripheral edema Low salt diet Elevated feet Compression hose - CMP14+EGFR - CBC with Differential/Platelet - Brain natriuretic peptide - Compression stockings  Labs pending Health Maintenance reviewed Diet and exercise encouraged  Follow up plan: 1 months for edema   [name], FNP

## 2024-01-03 ENCOUNTER — Other Ambulatory Visit: Payer: Self-pay | Admitting: Family

## 2024-01-03 ENCOUNTER — Ambulatory Visit: Payer: Self-pay | Admitting: Family

## 2024-01-03 ENCOUNTER — Ambulatory Visit: Payer: Self-pay

## 2024-01-03 LAB — CBC WITH DIFFERENTIAL/PLATELET
Basophils Absolute: 0.1 10*3/uL (ref 0.0–0.2)
Basos: 1 %
EOS (ABSOLUTE): 0.3 10*3/uL (ref 0.0–0.4)
Eos: 3 %
Hematocrit: 43.5 % (ref 37.5–51.0)
Hemoglobin: 14.7 g/dL (ref 13.0–17.7)
Immature Grans (Abs): 0 10*3/uL (ref 0.0–0.1)
Immature Granulocytes: 0 %
Lymphocytes Absolute: 2.3 10*3/uL (ref 0.7–3.1)
Lymphs: 22 %
MCH: 32.5 pg (ref 26.6–33.0)
MCHC: 33.8 g/dL (ref 31.5–35.7)
MCV: 96 fL (ref 79–97)
Monocytes Absolute: 0.9 10*3/uL (ref 0.1–0.9)
Monocytes: 9 %
Neutrophils Absolute: 6.9 10*3/uL (ref 1.4–7.0)
Neutrophils: 65 %
Platelets: 308 10*3/uL (ref 150–450)
RBC: 4.53 x10E6/uL (ref 4.14–5.80)
RDW: 12.7 % (ref 11.6–15.4)
WBC: 10.6 10*3/uL (ref 3.4–10.8)

## 2024-01-03 LAB — CMP14+EGFR
ALT: 14 IU/L (ref 0–44)
Albumin: 3.3 g/dL — AB (ref 3.8–4.8)
BUN: 9 mg/dL (ref 8–27)
CO2: 19 mmol/L — AB (ref 20–29)
Calcium: 9 mg/dL (ref 8.6–10.2)
Chloride: 103 mmol/L (ref 96–106)
Potassium: 5.3 mmol/L — AB (ref 3.5–5.2)
Sodium: 141 mmol/L (ref 134–144)

## 2024-01-03 LAB — LIPID PANEL
Cholesterol, Total: 180 mg/dL (ref 100–199)
HDL: 71 mg/dL (ref >39)
LDL CALC COMMENT:: 2.5 ratio (ref 0.0–5.0)
Triglycerides: 72 mg/dL (ref 0–149)
VLDL Cholesterol Cal: 13 mg/dL (ref 5–40)

## 2024-01-03 LAB — BRAIN NATRIURETIC PEPTIDE: BNP: 123.9 pg/mL — AB (ref 0.0–100.0)

## 2024-01-03 MED ORDER — ROSUVASTATIN CALCIUM 5 MG PO TABS
5.0000 mg | ORAL_TABLET | Freq: Every day | ORAL | 3 refills | Status: DC
Start: 2024-01-03 — End: 2024-02-24

## 2024-01-03 NOTE — Telephone Encounter (Signed)
 FYI Only or Action Required?: FYI only for provider.  Patient was last seen in primary care on 01/02/2024 by Lavell Bari LABOR, FNP. Called Nurse Triage reporting Advice Only. Symptoms began today. Interventions attempted: Nothing. Symptoms are: stable.  Triage Disposition: Information or Advice Only Call  Patient/caregiver understands and will follow disposition?: Yes    Copied from CRM [id number]. Topic: Clinical - Lab/Test Results >> Jan 03, 2024 12:26 PM Victor Shields wrote: Reason for CRM: Victor Shields called in about lab resutls.  Read as follows:  Lavell Bari LABOR, FNP to [facility]     01/03/24 11:08 AM Result Note Kidney and liver function stable CBC stable  LDL stable- The 10-year ASCVD risk score (Arnett DK, et al., 2019) is: 23.4%- Crestor  5 mg Prescription sent to pharmacy  Thyroid  levels normal    Victor Shields has further quesitons Reason for Disposition  Health Information question, no triage required and triager able to answer question  Answer Assessment - Initial Assessment Questions 1. REASON FOR CALL or QUESTION: What is your reason for calling today? or How can I best help you? or What question do you have that I can help answer?     Wife wanted to know lab results and what crestor  is for.  This RN was able to answer questions, no triage needed.  Protocols used: Information Only Call - No Triage-A-AH

## 2024-01-03 NOTE — Telephone Encounter (Signed)
 noted

## 2024-01-06 ENCOUNTER — Ambulatory Visit: Payer: Self-pay

## 2024-01-06 NOTE — Telephone Encounter (Signed)
 Appt made for 7/3 at 1:55. Wife aware.

## 2024-01-06 NOTE — Telephone Encounter (Signed)
 FYI Only or Action Required?: Action required by provider: clinical question for provider.  Patient was last seen in primary care on 01/02/2024 by Lavell Bari LABOR, FNP. Called Nurse Triage reporting Medication Reaction. Symptoms began today. Interventions attempted: Rest, hydration, or home remedies. Symptoms are: unchanged.  Triage Disposition: Call PCP Now  Patient/caregiver understands and will follow disposition?: Yes   Wife states pt started Lasix  on last week and has been fine until this morning, states he said he was lightheaded when he woke up and dizzy when he tried to walk to the [place].     Copied from CRM [id number]. Topic: Clinical - Red Word Triage >> Jan 06, 2024  8:17 AM Victor Shields wrote: Patient was given fluid on January 01, 2025  and today he woke up light headed and dizzy. >> Jan 06, 2024  8:18 AM Victor Shields wrote: Correction- patient was given fluid pills Reason for Disposition  [1] Caller has URGENT medicine question about med that PCP or specialist prescribed AND [2] triager unable to answer question  Answer Assessment - Initial Assessment Questions Doesn't have a BP cuff to take vitals.     1. NAME of MEDICINE: What medicine(s) are you calling about?     lasix  2. QUESTION: What is your question? (e.g., double dose of medicine, side effect)     Side effects 3. PRESCRIBER: Who prescribed the medicine? Reason: if prescribed by specialist, call should be referred to that group.     [name] 4. SYMPTOMS: Do you have any symptoms? If Yes, ask: What symptoms are you having?  How bad are the symptoms (e.g., mild, moderate, severe)     Lightheaded and dizzy. -mild  Protocols used: Medication Question Call-A-AH

## 2024-01-06 NOTE — Telephone Encounter (Signed)
 Could cause dizziness. Please schedule him a follow up with me to recheck BP and edema this week if possible.

## 2024-01-09 ENCOUNTER — Ambulatory Visit (INDEPENDENT_AMBULATORY_CARE_PROVIDER_SITE_OTHER): Admitting: Family

## 2024-01-09 ENCOUNTER — Encounter: Payer: Self-pay | Admitting: Family

## 2024-01-09 VITALS — BP 126/60 | HR 68 | Temp 98.0°F | Ht 68.0 in | Wt 135.4 lb

## 2024-01-09 DIAGNOSIS — R42 Dizziness and giddiness: Secondary | ICD-10-CM | POA: Diagnosis not present

## 2024-01-09 DIAGNOSIS — R600 Localized edema: Secondary | ICD-10-CM

## 2024-01-09 MED ORDER — MECLIZINE HCL 25 MG PO TABS: 25.0000 mg | ORAL_TABLET | Freq: Three times a day (TID) | ORAL | 0 refills | Status: DC | PRN

## 2024-01-09 MED ORDER — ONDANSETRON HCL 4 MG PO TABS: 4.0000 mg | ORAL_TABLET | Freq: Three times a day (TID) | ORAL | 0 refills | Status: DC | PRN

## 2024-01-09 NOTE — Progress Notes (Addendum)
 Subjective:    Patient ID: Victor Shields, male    DOB: 1950-08-12, 73 y.o.   MRN: [id number]  Chief Complaint  Patient presents with   Follow-up    Dizzy sick on stomach from lasix     Pt presents to the office today for dizziness. He started Lasix  last week and started having dizziness since Sunday. He stopped the lasix .  Reports his dizziness if worse when he bends over and moves his head.      01/09/2024    2:02 PM 01/02/2024    8:24 AM 12/03/2023    1:55 PM  Last 3 Weights  Weight (lbs) 135 lb 6.4 oz 139 lb 140 lb  Weight (kg) 61.417 kg 63.05 kg 63.504 kg     Dizziness This is a new problem. The current episode started in the past 7 days. The problem occurs intermittently. The problem has been waxing and waning. Associated symptoms include nausea. Pertinent negatives include no chills, congestion, coughing, fatigue, fever, headaches or joint swelling. Associated symptoms comments: Ear fullness . He has tried rest for the symptoms. The treatment provided mild relief.      Review of Systems  Constitutional:  Negative for chills, fatigue and fever.  HENT:  Negative for congestion.   Respiratory:  Negative for cough.   Gastrointestinal:  Positive for nausea.  Musculoskeletal:  Negative for joint swelling.  Neurological:  Positive for dizziness. Negative for headaches.  All other systems reviewed and are negative.   Social History   Socioeconomic History   Marital status: Married    Spouse name: [name]   Number of children: 1   Years of education: Not on file   Highest education level: 9th grade  Occupational History   Occupation: Retired    Associate Professor: [employer]  Tobacco Use   Smoking status: Every Day    Current packs/day: 0.50    Average packs/day: 0.5 packs/day for 48.5 years (24.3 ttl pk-yrs)    Types: Cigarettes    Start date: 07/10/1975   Smokeless tobacco: Never  Vaping Use   Vaping status: Never Used  Substance and Sexual Activity    Alcohol use: Yes    Alcohol/week: 24.0 standard drinks of alcohol    Types: 24 Standard drinks or equivalent per week    Comment: couple of beers daily   Drug use: No   Sexual activity: Not Currently  Other Topics Concern   Not on file  Social History Narrative   Lives at home with wife - one level living   Social Drivers of Health   Financial Resource Strain: Low Risk  (12/03/2023)   Overall Financial Resource Strain (CARDIA)    Difficulty of Paying Living Expenses: Not hard at all  Food Insecurity: No Food Insecurity (12/03/2023)   Hunger Vital Sign    Worried About Running Out of Food in the Last Year: Never true    Ran Out of Food in the Last Year: Never true  Transportation Needs: No Transportation Needs (12/03/2023)   PRAPARE - Administrator, Civil Service (Medical): No    Lack of Transportation (Non-Medical): No  Physical Activity: Insufficiently Active (12/03/2023)   Exercise Vital Sign    Days of Exercise per Week: 3 days    Minutes of Exercise per Session: 20 min  Stress: No Stress Concern Present (12/03/2023)   [employer] of Occupational Health - Occupational Stress Questionnaire    Feeling of Stress : Not at all  Social Connections: Moderately  Isolated (12/03/2023)   Social Connection and Isolation Panel    Frequency of Communication with Friends and Family: More than three times a week    Frequency of Social Gatherings with Friends and Family: More than three times a week    Attends Religious Services: Never    Database administrator or Organizations: No    Attends Banker Meetings: Never    Marital Status: Married   History reviewed. No pertinent family history.      Objective:   Physical Exam Vitals reviewed.  Constitutional:      General: He is not in acute distress.    Appearance: He is well-developed.  HENT:     Head: Normocephalic.     Right Ear: Tympanic membrane normal.     Left Ear: Tympanic membrane normal.   Eyes:     General:        Right eye: No discharge.        Left eye: No discharge.     Pupils: Pupils are equal, round, and reactive to light.  Neck:     Thyroid : No thyromegaly.  Cardiovascular:     Rate and Rhythm: Normal rate and regular rhythm.     Heart sounds: Normal heart sounds. No murmur heard. Pulmonary:     Effort: Pulmonary effort is normal. No respiratory distress.     Breath sounds: Normal breath sounds. No wheezing.  Abdominal:     General: Bowel sounds are normal. There is no distension.     Palpations: Abdomen is soft.     Tenderness: There is no abdominal tenderness.  Musculoskeletal:        General: No tenderness. Normal range of motion.     Cervical back: Normal range of motion and neck supple.     Right lower leg: Edema (3+ in ankles) present.     Left lower leg: Edema (3+) present.  Skin:    General: Skin is warm and dry.     Findings: No erythema or rash.  Neurological:     Mental Status: He is alert and oriented to person, place, and time.     Cranial Nerves: No cranial nerve deficit.     Deep Tendon Reflexes: Reflexes are normal and symmetric.  Psychiatric:        Behavior: Behavior normal.        Thought Content: Thought content normal.        Judgment: Judgment normal.       BP 126/60   Pulse 68   Temp 98 F (36.7 C) (Temporal)   Ht 5' 8 (1.727 m)   Wt 135 lb 6.4 oz (61.4 kg) Comment: Patient stated he could not get on scale today  SpO2 97%   BMI 20.59 kg/m      Assessment & Plan:  Ardith Jerren Flinchbaugh comes in today with chief complaint of Follow-up (Dizzy sick on stomach from lasix  )   Diagnosis and orders addressed:  1. Dizziness (Primary) Force fluids Fall precautions discussed Antivert TID prn  Zofran  as needed  Epley exercises discussed  - BMP8+EGFR - ondansetron  (ZOFRAN ) 4 MG tablet; Take 1 tablet (4 mg total) by mouth every 8 (eight) hours as needed for nausea or vomiting.  Dispense: 30 tablet; Refill: 0 - meclizine  (ANTIVERT) 25 MG tablet; Take 1 tablet (25 mg total) by mouth 3 (three) times daily as needed for dizziness.  Dispense: 60 tablet; Refill: 0  2. Peripheral edema Lasix  as needed Compression hose  -  BMP8+EGFR   Labs pending Continue current medications  Fall precautions   [name], FNP

## 2024-01-09 NOTE — Patient Instructions (Addendum)
 Vertigo Vertigo is the feeling that you or the things around you are moving or spinning when they're not. It's different than feeling dizzy. It can also cause: Loss of balance. Trouble standing or walking. Nausea and vomiting. This feeling can come and go at any time. It can last from a few seconds to minutes or even hours. It may go away on its own or be treated with medicine. What are the types of vertigo? There are two types of vertigo: Peripheral vertigo happens when parts of your inner ear don't work like they should. This is the more common type. Central vertigo happens when your brain and spinal cord don't work like they should. Your health care provider will do tests to find out what kind of vertigo you have. This will help them decide on the right treatment for you. Follow these instructions at home: Eating and drinking Drink enough fluid to keep your pee (urine) pale yellow. Do not drink alcohol. Activity When you get up in the morning, first sit up on the side of the bed. When you feel okay, stand slowly while holding onto something. Move slowly. Avoid sudden body or head movements. Avoid certain positions, as told by your provider. Use a cane if you have trouble standing or walking. Sit down right away if you feel unsteady. Place items in your home so they're easy for you to reach without bending or leaning over. Return to normal activities when you're told. Ask what things are safe for you to do. General instructions Take your medicines only as told by your provider. Contact a health care provider if: Your medicines don't help or make your vertigo worse. You get new symptoms. You have a fever. You have nausea or vomiting. Your family or friends spot any changes in how you're acting. A part of your body goes numb. You feel tingling and prickling in a part of your body. You get very bad headaches. Get help right away if: You're always dizzy or you faint. You have a  stiff neck. You have trouble moving or speaking. Your hands, arms, or legs feel weak. Your hearing or eyesight changes. These symptoms may be an emergency. Call 911 right away. Do not wait to see if the symptoms will go away. Do not drive yourself to the hospital. This information is not intended to replace advice given to you by your health care provider. Make sure you discuss any questions you have with your health care provider.  How to Perform the Epley Maneuver The Epley maneuver is an exercise that relieves symptoms of vertigo. Vertigo is the feeling that you or your surroundings are moving when they are not. When you feel vertigo, you may feel like the room is spinning and may have trouble walking. The Epley maneuver is used for a type of vertigo caused by a calcium  deposit in a part of the inner ear. The maneuver involves changing head positions to help the deposit move out of the area. You can do this maneuver at home whenever you have symptoms of vertigo. You can repeat it in 24 hours if your vertigo has not gone away. Even though the Epley maneuver may relieve your vertigo for a few weeks, it is possible that your symptoms will return. This maneuver relieves vertigo, but it does not relieve dizziness. What are the risks? If it is done correctly, the Epley maneuver is considered safe. Sometimes it can lead to dizziness or nausea that goes away after a short time.  If you develop other symptoms--such as changes in vision, weakness, or numbness--stop doing the maneuver and call your health care provider. Supplies needed: A bed or table. A pillow. How to do the Epley maneuver     Sit on the edge of a bed or table with your back straight and your legs extended or hanging over the edge of the bed or table. Turn your head halfway toward the affected ear or side as told by your health care provider. Lie backward quickly with your head turned until you are lying flat on your back. Your head  should dangle (head-hanging position). You may want to position a pillow under your shoulders. Hold this position for at least 30 seconds. If you feel dizzy or have symptoms of vertigo, continue to hold the position until the symptoms stop. Turn your head to the opposite direction until your unaffected ear is facing down. Your head should continue to dangle. Hold this position for at least 30 seconds. If you feel dizzy or have symptoms of vertigo, continue to hold the position until the symptoms stop. Turn your whole body to the same side as your head so that you are positioned on your side. Your head will now be nearly facedown and no longer needs to dangle. Hold for at least 30 seconds. If you feel dizzy or have symptoms of vertigo, continue to hold the position until the symptoms stop. Sit back up. You can repeat the maneuver in 24 hours if your vertigo does not go away. Follow these instructions at home: For 24 hours after doing the Epley maneuver: Keep your head in an upright position. When lying down to sleep or rest, keep your head raised (elevated) with two or more pillows. Avoid excessive neck movements. Activity Do not drive or use machinery if you feel dizzy. After doing the Epley maneuver, return to your normal activities as told by your health care provider. Ask your health care provider what activities are safe for you. General instructions Drink enough fluid to keep your urine pale yellow. Do not drink alcohol. Take over-the-counter and prescription medicines only as told by your health care provider. Keep all follow-up visits. This is important. Preventing vertigo symptoms Ask your health care provider if there is anything you should do at home to prevent vertigo. He or she may recommend that you: Keep your head elevated with two or more pillows while you sleep. Do not sleep on the side of your affected ear. Get up slowly from bed. Avoid sudden movements during the  day. Avoid extreme head positions or movement, such as looking up or bending over. Contact a health care provider if: Your vertigo gets worse. You have other symptoms, including: Nausea. Vomiting. Headache. Get help right away if you: Have vision changes. Have a headache or neck pain that is severe or getting worse. Cannot stop vomiting. Have new numbness or weakness in any part of your body. These symptoms may represent a serious problem that is an emergency. Do not wait to see if the symptoms will go away. Get medical help right away. Call your local emergency services (911 in the [place] Do not drive yourself to the hospital. Summary Vertigo is the feeling that you or your surroundings are moving when they are not. The Epley maneuver is an exercise that relieves symptoms of vertigo. If the Epley maneuver is done correctly, it is considered safe. This information is not intended to replace advice given to you by your health care provider.  Make sure you discuss any questions you have with your health care provider. Document Revised: 03/22/2023 Document Reviewed: 03/22/2023 Elsevier Patient Education  2024 Elsevier Inc. Document Revised: 03/28/2023 Document Reviewed: 09/28/2022 Elsevier Patient Education  2024 [employer].

## 2024-01-10 LAB — BMP8+EGFR
BUN/Creatinine Ratio: 15 (ref 10–24)
BUN: 9 mg/dL (ref 8–27)
CO2: 17 mmol/L — ABNORMAL LOW (ref 20–29)
Calcium: 8.9 mg/dL (ref 8.6–10.2)
Chloride: 108 mmol/L — ABNORMAL HIGH (ref 96–106)
Creatinine, Ser: 0.61 mg/dL — ABNORMAL LOW (ref 0.76–1.27)
Glucose: 121 mg/dL — ABNORMAL HIGH (ref 70–99)
Potassium: 5.6 mmol/L — ABNORMAL HIGH (ref 3.5–5.2)
Sodium: 147 mmol/L — ABNORMAL HIGH (ref 134–144)
eGFR: 101 mL/min/1.73 (ref >59)

## 2024-01-13 ENCOUNTER — Other Ambulatory Visit: Payer: Self-pay | Admitting: Family Medicine

## 2024-01-13 ENCOUNTER — Ambulatory Visit: Payer: Self-pay | Admitting: Family

## 2024-01-13 DIAGNOSIS — E875 Hyperkalemia: Secondary | ICD-10-CM

## 2024-01-17 ENCOUNTER — Other Ambulatory Visit

## 2024-01-17 DIAGNOSIS — E875 Hyperkalemia: Secondary | ICD-10-CM

## 2024-01-17 LAB — CMP14+EGFR
ALT: 14 IU/L (ref 0–44)
AST: 22 IU/L (ref 0–40)
Albumin: 3.1 g/dL — ABNORMAL LOW (ref 3.8–4.8)
Alkaline Phosphatase: 100 IU/L (ref 44–121)
BUN/Creatinine Ratio: 16 (ref 10–24)
BUN: 10 mg/dL (ref 8–27)
Bilirubin Total: 0.7 mg/dL (ref 0.0–1.2)
CO2: 24 mmol/L (ref 20–29)
Calcium: 9.1 mg/dL (ref 8.6–10.2)
Chloride: 102 mmol/L (ref 96–106)
Creatinine, Ser: 0.62 mg/dL — ABNORMAL LOW (ref 0.76–1.27)
Globulin, Total: 2.9 g/dL (ref 1.5–4.5)
Glucose: 86 mg/dL (ref 70–99)
Potassium: 3.8 mmol/L (ref 3.5–5.2)
Sodium: 143 mmol/L (ref 134–144)
Total Protein: 6 g/dL (ref 6.0–8.5)
eGFR: 100 mL/min/1.73 (ref >59)

## 2024-01-20 ENCOUNTER — Ambulatory Visit: Payer: Self-pay | Admitting: Family

## 2024-01-27 ENCOUNTER — Inpatient Hospital Stay (HOSPITAL_COMMUNITY)
Admission: EM | Admit: 2024-01-27 | Discharge: 2024-02-04 | DRG: 193 | Disposition: A | Discharge status: Skilled Nursing Facility | Attending: Internal Medicine | Admitting: Internal Medicine

## 2024-01-27 ENCOUNTER — Inpatient Hospital Stay (HOSPITAL_COMMUNITY)

## 2024-01-27 ENCOUNTER — Encounter (HOSPITAL_COMMUNITY): Payer: Self-pay | Admitting: *Deleted

## 2024-01-27 ENCOUNTER — Emergency Department (HOSPITAL_COMMUNITY)

## 2024-01-27 ENCOUNTER — Other Ambulatory Visit: Payer: Self-pay

## 2024-01-27 DIAGNOSIS — F17210 Nicotine dependence, cigarettes, uncomplicated: Secondary | ICD-10-CM | POA: Diagnosis present

## 2024-01-27 DIAGNOSIS — Z716 Tobacco abuse counseling: Secondary | ICD-10-CM

## 2024-01-27 DIAGNOSIS — R600 Localized edema: Secondary | ICD-10-CM | POA: Diagnosis present

## 2024-01-27 DIAGNOSIS — R0602 Shortness of breath: Secondary | ICD-10-CM

## 2024-01-27 DIAGNOSIS — E782 Mixed hyperlipidemia: Secondary | ICD-10-CM | POA: Diagnosis present

## 2024-01-27 DIAGNOSIS — J189 Pneumonia, unspecified organism: Principal | ICD-10-CM | POA: Diagnosis present

## 2024-01-27 DIAGNOSIS — M109 Gout, unspecified: Secondary | ICD-10-CM | POA: Diagnosis present

## 2024-01-27 DIAGNOSIS — I480 Paroxysmal atrial fibrillation: Secondary | ICD-10-CM

## 2024-01-27 DIAGNOSIS — Z720 Tobacco use: Secondary | ICD-10-CM | POA: Diagnosis present

## 2024-01-27 DIAGNOSIS — J90 Pleural effusion, not elsewhere classified: Secondary | ICD-10-CM | POA: Diagnosis present

## 2024-01-27 DIAGNOSIS — Z555 Less than a high school diploma: Secondary | ICD-10-CM

## 2024-01-27 DIAGNOSIS — J9601 Acute respiratory failure with hypoxia: Secondary | ICD-10-CM | POA: Diagnosis not present

## 2024-01-27 DIAGNOSIS — I5031 Acute diastolic (congestive) heart failure: Secondary | ICD-10-CM | POA: Diagnosis present

## 2024-01-27 DIAGNOSIS — I700 Atherosclerosis of aorta: Secondary | ICD-10-CM | POA: Diagnosis not present

## 2024-01-27 DIAGNOSIS — R0609 Other forms of dyspnea: Secondary | ICD-10-CM

## 2024-01-27 DIAGNOSIS — Z7901 Long term (current) use of anticoagulants: Secondary | ICD-10-CM | POA: Diagnosis not present

## 2024-01-27 DIAGNOSIS — R000 Tachycardia, unspecified: Secondary | ICD-10-CM | POA: Diagnosis present

## 2024-01-27 DIAGNOSIS — Z79899 Other long term (current) drug therapy: Secondary | ICD-10-CM | POA: Diagnosis not present

## 2024-01-27 DIAGNOSIS — F17200 Nicotine dependence, unspecified, uncomplicated: Secondary | ICD-10-CM | POA: Diagnosis not present

## 2024-01-27 DIAGNOSIS — I110 Hypertensive heart disease with heart failure: Secondary | ICD-10-CM | POA: Diagnosis present

## 2024-01-27 DIAGNOSIS — R9389 Abnormal findings on diagnostic imaging of other specified body structures: Secondary | ICD-10-CM | POA: Diagnosis not present

## 2024-01-27 DIAGNOSIS — I4892 Unspecified atrial flutter: Secondary | ICD-10-CM | POA: Diagnosis not present

## 2024-01-27 DIAGNOSIS — I10 Essential (primary) hypertension: Secondary | ICD-10-CM | POA: Diagnosis not present

## 2024-01-27 DIAGNOSIS — J441 Chronic obstructive pulmonary disease with (acute) exacerbation: Secondary | ICD-10-CM | POA: Diagnosis not present

## 2024-01-27 DIAGNOSIS — R0603 Acute respiratory distress: Secondary | ICD-10-CM | POA: Diagnosis present

## 2024-01-27 DIAGNOSIS — Z48813 Encounter for surgical aftercare following surgery on the respiratory system: Secondary | ICD-10-CM | POA: Diagnosis not present

## 2024-01-27 DIAGNOSIS — Z1152 Encounter for screening for COVID-19: Secondary | ICD-10-CM | POA: Diagnosis not present

## 2024-01-27 DIAGNOSIS — T380X5A Adverse effect of glucocorticoids and synthetic analogues, initial encounter: Secondary | ICD-10-CM | POA: Diagnosis present

## 2024-01-27 DIAGNOSIS — I4891 Unspecified atrial fibrillation: Secondary | ICD-10-CM | POA: Diagnosis not present

## 2024-01-27 DIAGNOSIS — R062 Wheezing: Secondary | ICD-10-CM | POA: Diagnosis not present

## 2024-01-27 DIAGNOSIS — Z880 Allergy status to penicillin: Secondary | ICD-10-CM | POA: Diagnosis not present

## 2024-01-27 DIAGNOSIS — D72829 Elevated white blood cell count, unspecified: Secondary | ICD-10-CM | POA: Diagnosis not present

## 2024-01-27 DIAGNOSIS — Z5320 Procedure and treatment not carried out because of patient's decision for unspecified reasons: Secondary | ICD-10-CM | POA: Diagnosis present

## 2024-01-27 DIAGNOSIS — J918 Pleural effusion in other conditions classified elsewhere: Secondary | ICD-10-CM | POA: Diagnosis present

## 2024-01-27 DIAGNOSIS — J9811 Atelectasis: Secondary | ICD-10-CM | POA: Diagnosis present

## 2024-01-27 DIAGNOSIS — R918 Other nonspecific abnormal finding of lung field: Secondary | ICD-10-CM | POA: Diagnosis not present

## 2024-01-27 DIAGNOSIS — J449 Chronic obstructive pulmonary disease, unspecified: Secondary | ICD-10-CM | POA: Diagnosis not present

## 2024-01-27 DIAGNOSIS — E785 Hyperlipidemia, unspecified: Secondary | ICD-10-CM | POA: Diagnosis not present

## 2024-01-27 DIAGNOSIS — J439 Emphysema, unspecified: Secondary | ICD-10-CM | POA: Diagnosis not present

## 2024-01-27 HISTORY — DX: Essential (primary) hypertension: I10

## 2024-01-27 HISTORY — DX: Hyperlipidemia, unspecified: E78.5

## 2024-01-27 HISTORY — DX: Chronic obstructive pulmonary disease, unspecified: J44.9

## 2024-01-27 LAB — ECHOCARDIOGRAM COMPLETE
AR max vel: 3.35 cm2
AV Area VTI: 3.31 cm2
AV Area mean vel: 3.3 cm2
AV Mean grad: 3 mmHg
AV Peak grad: 6.5 mmHg
Ao pk vel: 1.27 m/s
Height: 68 in
S' Lateral: 3.7 cm
Weight: 2165.8 [oz_av]

## 2024-01-27 LAB — BASIC METABOLIC PANEL WITH GFR
Anion gap: 12 (ref 5–15)
BUN: 10 mg/dL (ref 8–23)
CO2: 25 mmol/L (ref 22–32)
Calcium: 8.6 mg/dL — ABNORMAL LOW (ref 8.9–10.3)
Chloride: 104 mmol/L (ref 98–111)
Creatinine, Ser: 0.55 mg/dL — ABNORMAL LOW (ref 0.61–1.24)
GFR, Estimated: >60 mL/min (ref >60)
Glucose, Bld: 105 mg/dL — ABNORMAL HIGH (ref 70–99)
Potassium: 4.2 mmol/L (ref 3.5–5.1)
Sodium: 141 mmol/L (ref 135–145)

## 2024-01-27 LAB — RESP PANEL BY RT-PCR (RSV, FLU A&B, COVID)  RVPGX2
Influenza A by PCR: NEGATIVE
Influenza B by PCR: NEGATIVE
Resp Syncytial Virus by PCR: NEGATIVE
SARS Coronavirus 2 by RT PCR: NEGATIVE

## 2024-01-27 LAB — PROTEIN, PLEURAL OR PERITONEAL FLUID: Total protein, fluid: <3 g/dL

## 2024-01-27 LAB — CBC WITH DIFFERENTIAL/PLATELET
Abs Immature Granulocytes: 0.03 K/uL (ref 0.00–0.07)
Basophils Absolute: 0.1 K/uL (ref 0.0–0.1)
Basophils Relative: 1 %
Eosinophils Absolute: 0.3 K/uL (ref 0.0–0.5)
Eosinophils Relative: 2 %
HCT: 45.4 % (ref 39.0–52.0)
Hemoglobin: 14.7 g/dL (ref 13.0–17.0)
Immature Granulocytes: 0 %
Lymphocytes Relative: 8 %
Lymphs Abs: 1 K/uL (ref 0.7–4.0)
MCH: 31.4 pg (ref 26.0–34.0)
MCHC: 32.4 g/dL (ref 30.0–36.0)
MCV: 97 fL (ref 80.0–100.0)
Monocytes Absolute: 0.4 K/uL (ref 0.1–1.0)
Monocytes Relative: 4 %
Neutro Abs: 10.2 K/uL — ABNORMAL HIGH (ref 1.7–7.7)
Neutrophils Relative %: 85 %
Platelets: 275 K/uL (ref 150–400)
RBC: 4.68 MIL/uL (ref 4.22–5.81)
RDW: 14 % (ref 11.5–15.5)
WBC: 11.9 K/uL — ABNORMAL HIGH (ref 4.0–10.5)
nRBC: 0 % (ref 0.0–0.2)

## 2024-01-27 LAB — LACTATE DEHYDROGENASE, PLEURAL OR PERITONEAL FLUID: LD, Fluid: 34 U/L — ABNORMAL HIGH (ref 3–23)

## 2024-01-27 LAB — BODY FLUID CELL COUNT WITH DIFFERENTIAL
Lymphs, Fluid: 82 %
Monocyte-Macrophage-Serous Fluid: 17 % — ABNORMAL LOW (ref 50–90)
Neutrophil Count, Fluid: 1 % (ref 0–25)
Total Nucleated Cell Count, Fluid: 48 uL (ref 0–1000)

## 2024-01-27 LAB — GLUCOSE, PLEURAL OR PERITONEAL FLUID: Glucose, Fluid: 97 mg/dL

## 2024-01-27 LAB — TROPONIN I (HIGH SENSITIVITY): Troponin I (High Sensitivity): 7 ng/L (ref <18)

## 2024-01-27 LAB — MAGNESIUM: Magnesium: 1.7 mg/dL (ref 1.7–2.4)

## 2024-01-27 LAB — BRAIN NATRIURETIC PEPTIDE: B Natriuretic Peptide: 376 pg/mL — ABNORMAL HIGH (ref 0.0–100.0)

## 2024-01-27 MED ORDER — IPRATROPIUM-ALBUTEROL 0.5-2.5 (3) MG/3ML IN SOLN
3.0000 mL | Freq: Three times a day (TID) | RESPIRATORY_TRACT | Status: AC
  Administered 2024-01-28 – 2024-01-31 (×12): 3 mL via RESPIRATORY_TRACT
  Filled 2024-01-27 (×11): qty 3

## 2024-01-27 MED ORDER — FENTANYL CITRATE PF 50 MCG/ML IJ SOSY: 12.5000 ug | PREFILLED_SYRINGE | INTRAMUSCULAR | Status: DC | PRN

## 2024-01-27 MED ORDER — METOPROLOL TARTRATE 5 MG/5ML IV SOLN
2.5000 mg | Freq: Four times a day (QID) | INTRAVENOUS | Status: DC
  Administered 2024-01-27 – 2024-01-29 (×7): 2.5 mg via INTRAVENOUS
  Filled 2024-01-27 (×6): qty 5

## 2024-01-27 MED ORDER — ENOXAPARIN SODIUM 40 MG/0.4ML IJ SOSY
40.0000 mg | PREFILLED_SYRINGE | INTRAMUSCULAR | Status: DC
  Administered 2024-01-27 – 2024-01-29 (×3): 40 mg via SUBCUTANEOUS
  Filled 2024-01-27 (×2): qty 0.4

## 2024-01-27 MED ORDER — DEXTROMETHORPHAN POLISTIREX ER 30 MG/5ML PO SUER: 30.0000 mg | Freq: Two times a day (BID) | ORAL | Status: DC | PRN

## 2024-01-27 MED ORDER — GUAIFENESIN ER 600 MG PO TB12
1200.0000 mg | ORAL_TABLET | Freq: Two times a day (BID) | ORAL | Status: DC
  Administered 2024-01-27 – 2024-01-29 (×6): 1200 mg via ORAL
  Administered 2024-01-30: 300 mg via ORAL
  Filled 2024-01-27 (×8): qty 2

## 2024-01-27 MED ORDER — ALBUTEROL SULFATE (2.5 MG/3ML) 0.083% IN NEBU
10.0000 mg/h | INHALATION_SOLUTION | Freq: Once | RESPIRATORY_TRACT | Status: AC
  Administered 2024-01-27: 10 mg/h via RESPIRATORY_TRACT
  Filled 2024-01-27: qty 12

## 2024-01-27 MED ORDER — BUDESON-GLYCOPYRROL-FORMOTEROL 160-9-4.8 MCG/ACT IN AERO
2.0000 | INHALATION_SPRAY | Freq: Two times a day (BID) | RESPIRATORY_TRACT | Status: DC
  Administered 2024-01-27 – 2024-01-29 (×4): 2 via RESPIRATORY_TRACT
  Filled 2024-01-27: qty 5.9

## 2024-01-27 MED ORDER — IPRATROPIUM-ALBUTEROL 0.5-2.5 (3) MG/3ML IN SOLN
3.0000 mL | Freq: Four times a day (QID) | RESPIRATORY_TRACT | Status: DC
  Administered 2024-01-27 (×2): 3 mL via RESPIRATORY_TRACT
  Filled 2024-01-27 (×2): qty 3

## 2024-01-27 MED ORDER — ROSUVASTATIN CALCIUM 10 MG PO TABS
5.0000 mg | ORAL_TABLET | Freq: Every day | ORAL | Status: DC
Start: 2024-01-27 — End: 2024-02-04
  Administered 2024-01-27 – 2024-02-04 (×9): 5 mg via ORAL
  Filled 2024-01-27 (×9): qty 1

## 2024-01-27 MED ORDER — TRAZODONE HCL 50 MG PO TABS: 25.0000 mg | ORAL_TABLET | Freq: Every evening | ORAL | Status: DC | PRN

## 2024-01-27 MED ORDER — ONDANSETRON HCL 4 MG PO TABS: 4.0000 mg | ORAL_TABLET | Freq: Four times a day (QID) | ORAL | Status: DC | PRN

## 2024-01-27 MED ORDER — OXYCODONE HCL 5 MG PO TABS: 5.0000 mg | ORAL_TABLET | ORAL | Status: DC | PRN

## 2024-01-27 MED ORDER — NICOTINE 14 MG/24HR TD PT24
14.0000 mg | MEDICATED_PATCH | Freq: Every day | TRANSDERMAL | Status: DC
  Administered 2024-01-27 – 2024-02-04 (×9): 14 mg via TRANSDERMAL
  Filled 2024-01-27 (×9): qty 1

## 2024-01-27 MED ORDER — LIDOCAINE HCL (PF) 2 % IJ SOLN
INTRAMUSCULAR | Status: AC
  Filled 2024-01-27: qty 20

## 2024-01-27 MED ORDER — IPRATROPIUM BROMIDE 0.02 % IN SOLN
0.5000 mg | Freq: Once | RESPIRATORY_TRACT | Status: AC
  Administered 2024-01-27: 0.5 mg via RESPIRATORY_TRACT
  Filled 2024-01-27: qty 2.5

## 2024-01-27 MED ORDER — ACETAMINOPHEN 325 MG PO TABS: 650.0000 mg | ORAL_TABLET | Freq: Four times a day (QID) | ORAL | Status: DC | PRN

## 2024-01-27 MED ORDER — MECLIZINE HCL 12.5 MG PO TABS: 25.0000 mg | ORAL_TABLET | Freq: Three times a day (TID) | ORAL | Status: DC | PRN

## 2024-01-27 MED ORDER — LORATADINE 10 MG PO TABS
10.0000 mg | ORAL_TABLET | Freq: Every day | ORAL | Status: DC
  Administered 2024-01-28 – 2024-02-04 (×8): 10 mg via ORAL
  Filled 2024-01-27 (×8): qty 1

## 2024-01-27 MED ORDER — LIDOCAINE HCL (PF) 2 % IJ SOLN
10.0000 mL | Freq: Once | INTRAMUSCULAR | Status: AC
  Administered 2024-01-27: 10 mL
  Filled 2024-01-27: qty 10

## 2024-01-27 MED ORDER — ONDANSETRON HCL 4 MG/2ML IJ SOLN: 4.0000 mg | Freq: Four times a day (QID) | INTRAMUSCULAR | Status: DC | PRN

## 2024-01-27 MED ORDER — SODIUM CHLORIDE 0.9 % IV SOLN
2.0000 g | Freq: Every day | INTRAVENOUS | Status: AC
  Administered 2024-01-27 – 2024-01-31 (×5): 2 g via INTRAVENOUS
  Filled 2024-01-27 (×5): qty 20

## 2024-01-27 MED ORDER — METHYLPREDNISOLONE SODIUM SUCC 125 MG IJ SOLR
60.0000 mg | Freq: Two times a day (BID) | INTRAMUSCULAR | Status: DC
  Administered 2024-01-27 – 2024-02-04 (×16): 60 mg via INTRAVENOUS
  Filled 2024-01-27 (×17): qty 2

## 2024-01-27 MED ORDER — DOXYCYCLINE HYCLATE 100 MG PO TABS
100.0000 mg | ORAL_TABLET | Freq: Two times a day (BID) | ORAL | Status: AC
  Administered 2024-01-27 – 2024-01-31 (×10): 100 mg via ORAL
  Filled 2024-01-27 (×10): qty 1

## 2024-01-27 MED ORDER — FENTANYL CITRATE (PF) 100 MCG/2ML IJ SOLN: 12.5000 ug | INTRAMUSCULAR | Status: DC | PRN

## 2024-01-27 MED ORDER — IPRATROPIUM-ALBUTEROL 0.5-2.5 (3) MG/3ML IN SOLN: 3.0000 mL | RESPIRATORY_TRACT | Status: DC | PRN

## 2024-01-27 MED ORDER — FLUTICASONE PROPIONATE 50 MCG/ACT NA SUSP
2.0000 | Freq: Every day | NASAL | Status: DC
  Administered 2024-01-28 – 2024-02-04 (×8): 2 via NASAL
  Filled 2024-01-27: qty 16

## 2024-01-27 MED ORDER — FUROSEMIDE 20 MG PO TABS
20.0000 mg | ORAL_TABLET | Freq: Every day | ORAL | Status: DC
  Administered 2024-01-27 – 2024-01-28 (×2): 20 mg via ORAL
  Filled 2024-01-27 (×2): qty 1

## 2024-01-27 MED ORDER — ACETAMINOPHEN 650 MG RE SUPP: 650.0000 mg | Freq: Four times a day (QID) | RECTAL | Status: DC | PRN

## 2024-01-27 MED ORDER — BISACODYL 5 MG PO TBEC
5.0000 mg | DELAYED_RELEASE_TABLET | Freq: Every day | ORAL | Status: DC | PRN
Start: 2024-01-27 — End: 2024-02-04
  Administered 2024-01-30: 5 mg via ORAL
  Filled 2024-01-27: qty 1

## 2024-01-27 NOTE — Progress Notes (Signed)
 Patient arrived to floor in visual respiratory distress with nebulizer treatment being administered. Patient stood turned pivoted sat on in patient bed , respiratory rate increased to 36-40 breathes per minute with significant drop in oxygen saturation and elevated hr to high 130's. Patient placed on 8 liters oxygen via [place] to get sats above 90%. Patient obtained skin tear to right lower forearm in process of moving. Area cleansed and dressed to prevent further injury. Patient physically unable to complete orthostatic vital signs. Patient placed in gown on tele monitor and sent to US  for thoracentis

## 2024-01-27 NOTE — Progress Notes (Signed)
*  PRELIMINARY RESULTS* Echocardiogram 2D Echocardiogram has been performed.  Benard FORBES Stallion 01/27/2024, 4:13 PM

## 2024-01-27 NOTE — H&P (Signed)
 History and Physical  [facility]  [name] FMW:[id number] DOB: 03-29-1951 DOA: 01/27/2024  PCP: Lavell Bari LABOR, FNP  Patient coming from: Home by [facility] Level of care: Telemetry  I have personally briefly reviewed patient's old medical records in [facility]  Chief Complaint: SOB   HPI: Victor Shields is a 73 year old male with hypertension, hyperlipidemia, COPD, ongoing tobacco use, presented to the emergency department with acute onset shortness of breath and peripheral edema.  EMS found him to be hypoxic at home with oxygen saturation in the mid 80s.  He was wheezing and coughing.  His baseline supplemental oxygen was increased to 3 L with some improvement in oxygen saturation to the mid 90s.  He was given 125 mg of Solu-Medrol  IV by EMS.  He says that he was recently started on Lasix  by his PCP and after starting it was having symptoms of dizziness.  Over the past 2 to 3 days he has had progressive shortness of breath wheezing coughing and weakness.  Patient reports shortness of breath with minimal exertion which is not his baseline.  He has some shortness of breath symptoms but no fever chills or chest pain.  His chest x-ray showed findings of a large right pleural effusion with compressive atelectasis of the right lung and possible pneumonia.  He had an elevated BNP.  White blood cell count 11.9.  Respiratory panel negative for influenza RSV and SARS coronavirus 2.  Hospital admission was requested for management.    Past Medical History:  Diagnosis Date   COPD (chronic obstructive pulmonary disease) (HCC)    Gout    Hyperlipidemia    Hypertension     Past Surgical History:  Procedure Laterality Date   CATARACT EXTRACTION     COMPRESSION HIP SCREW Left 02/17/2020   Procedure: OPEN TREATMENT INTERNAL FIXATION LEFT HIP;  Surgeon: Margrette Taft BRAVO, MD;  Location: [facility];  Service: Orthopedics;  Laterality: Left;     reports that he has been smoking  cigarettes. He started smoking about 48 years ago. He has a 24.3 pack-year smoking history. He has never used smokeless tobacco. He reports current alcohol use of about 24.0 standard drinks of alcohol per week. He reports that he does not use drugs.  Allergies  Allergen Reactions   Penicillins Swelling and Other (See Comments)    History reviewed. No pertinent family history.  Prior to Admission medications   Medication Sig Start Date End Date Taking? Authorizing Provider  albuterol  (VENTOLIN  HFA) 108 (90 Base) MCG/ACT inhaler Inhale 2 puffs into the lungs every 6 (six) hours as needed for wheezing or shortness of breath. **NEEDS TO BE SEEN BEFORE NEXT REFILL** 01/02/24   Lavell Bari A, FNP  Budeson-Glycopyrrol-Formoterol  (BREZTRI  AEROSPHERE) 160-9-4.8 MCG/ACT AERO Inhale 2 puffs into the lungs 2 (two) times daily. 04/22/23   Lavell Bari A, FNP  cetirizine  (ZYRTEC ) 10 MG tablet Take 1 tablet (10 mg total) by mouth daily. 01/02/24   Lavell Bari LABOR, FNP  fluticasone  (FLONASE ) 50 MCG/ACT nasal spray Place 2 sprays into both nostrils daily. 01/02/24   Lavell Bari A, FNP  furosemide  (LASIX ) 20 MG tablet Take 1 tablet (20 mg total) by mouth daily. 01/02/24   Lavell Bari LABOR, FNP  meclizine  (ANTIVERT ) 25 MG tablet Take 1 tablet (25 mg total) by mouth 3 (three) times daily as needed for dizziness. 01/09/24   Lavell Bari A, FNP  ondansetron  (ZOFRAN ) 4 MG tablet Take 1 tablet (4 mg total) by mouth  every 8 (eight) hours as needed for nausea or vomiting. 01/09/24   Lavell Lye A, FNP  rosuvastatin  (CRESTOR ) 5 MG tablet Take 1 tablet (5 mg total) by mouth daily. 01/03/24   Lavell Lye LABOR, FNP  sildenafil  (VIAGRA ) 100 MG tablet Take 0.5-1 tablets (50-100 mg total) by mouth daily as needed for erectile dysfunction. 01/02/24   Lavell Lye A, FNP  trolamine salicylate (ASPERCREME) 10 % cream Apply 1 application topically as needed for muscle pain.    [provider]    Physical  Exam: Vitals:   01/27/24 0754 01/27/24 0755 01/27/24 0932 01/27/24 1024  BP: (!) 159/72   (!) 153/56  Pulse: (!) 111   (!) 106  Resp: (!) 24   20  Temp: 98.1 F (36.7 C)   98.1 F (36.7 C)  TempSrc: Oral   Oral  SpO2: 94%  93% 100%  Weight:  61.4 kg    Height:  5' 8 (1.727 m)      Constitutional: NAD, calm, comfortable Eyes: PERRL, lids and conjunctivae normal ENMT: Mucous membranes are moist. Posterior pharynx clear of any exudate or lesions.Normal dentition.  Neck: normal, supple, no masses, no thyromegaly Respiratory: diffuse bilateral expiratory wheezing and rales, diminished BS R lung base.  Cardiovascular: tachycardic rate, normal s1, s2 sounds, no murmurs / rubs / gallops. No extremity edema. 2+ pedal pulses. No carotid bruits.  Abdomen: no tenderness, no masses palpated. No hepatosplenomegaly. Bowel sounds positive.  Musculoskeletal: no clubbing / cyanosis. No joint deformity upper and lower extremities. Good ROM, no contractures. Normal muscle tone.  Skin: no rashes, lesions, ulcers. No induration Neurologic: CN 2-12 grossly intact. Sensation intact, DTR normal. Strength 5/5 in all 4.  Psychiatric: Normal judgment and insight. Alert and oriented x 3. Normal mood.   Labs on Admission: I have personally reviewed following labs and imaging studies  CBC: Recent Labs  Lab 01/27/24 0858  WBC 11.9*  NEUTROABS 10.2*  HGB 14.7  HCT 45.4  MCV 97.0  PLT 275   Basic Metabolic Panel: Recent Labs  Lab 01/27/24 0858  NA 141  K 4.2  CL 104  CO2 25  GLUCOSE 105*  BUN 10  CREATININE 0.55*  CALCIUM  8.6*  MG 1.7   GFR: Estimated Creatinine Clearance: 70.4 mL/min (A) (by C-G formula based on SCr of 0.55 mg/dL (L)). Liver Function Tests: No results for input(s): AST, ALT, ALKPHOS, BILITOT, PROT, ALBUMIN in the last 168 hours. No results for input(s): LIPASE, AMYLASE in the last 168 hours. No results for input(s): AMMONIA in the last 168  hours. Coagulation Profile: No results for input(s): INR, PROTIME in the last 168 hours. Cardiac Enzymes: No results for input(s): CKTOTAL, CKMB, CKMBINDEX, TROPONINI in the last 168 hours. BNP (last 3 results) No results for input(s): PROBNP in the last 8760 hours. HbA1C: No results for input(s): HGBA1C in the last 72 hours. CBG: No results for input(s): GLUCAP in the last 168 hours. Lipid Profile: No results for input(s): CHOL, HDL, LDLCALC, TRIG, CHOLHDL, LDLDIRECT in the last 72 hours. Thyroid  Function Tests: No results for input(s): TSH, T4TOTAL, FREET4, T3FREE, THYROIDAB in the last 72 hours. Anemia Panel: No results for input(s): VITAMINB12, FOLATE, FERRITIN, TIBC, IRON, RETICCTPCT in the last 72 hours. Urine analysis:    Component Value Date/Time   APPEARANCEUR Cloudy (A) 05/19/2021 1459   GLUCOSEU Negative 05/19/2021 1459   BILIRUBINUR Negative 05/19/2021 1459   PROTEINUR 1+ (A) 05/19/2021 1459   NITRITE Negative 05/19/2021 1459   LEUKOCYTESUR  Negative 05/19/2021 1459    Radiological Exams on Admission: DG Chest Port 1 View Result Date: 01/27/2024 CLINICAL DATA:  Shortness of breath, wheezing, low O2 sats. EXAM: PORTABLE CHEST 1 VIEW COMPARISON:  02/15/2020. FINDINGS: Trachea is midline. Heart size stable. Large right pleural effusion with right basilar collapse/consolidation. Streaky atelectasis in the left lung base. IMPRESSION: Large right pleural effusion with collapse/consolidation in the right lung base, possibly due to pneumonia. Difficult exclude underlying malignancy. Followup PA and lateral chest X-ray is recommended in 3-4 weeks following trial of antibiotic therapy to ensure resolution and exclude underlying malignancy. Electronically Signed   By: Newell Eke M.D.   On: 01/27/2024 09:25    EKG: Independently reviewed. Sinus tachycardia  Assessment/Plan Principal Problem:   Acute respiratory  distress Active Problems:   Tobacco abuse   COPD exacerbation (HCC)   Peripheral edema   Pleural effusion, right   Leukocytosis   Sinus tachycardia   Acute respiratory failure with hypoxia -- Secondary to large right pleural effusion -- Secondary to acute COPD exacerbation -- Orders immediately placed for IR evaluation for possible thoracentesis to evacuate pleural effusion compressing right lung -- Discussed with patient and he was agreeable to proceed with procedure -- Treating COPD exacerbation as noted below  Acute COPD exacerbation -- Continue antibiotics as ordered: Ceftriaxone  and doxycycline  -- IV steroids as ordered around-the-clock -- Scheduled bronchodilators as ordered -- Supplemental oxygen with goal pulse ox greater than 88% -- Wean oxygen as able -- Cough suppressants and mucolytic's ordered  Tobacco -- Nicotine  patch ordered for cravings -- Briefly counseled on tobacco cessation at bedside -- Nurse to provide tobacco cessation education as well  Leukocytosis -- Suspect secondary to community-acquired pneumonia and COPD exacerbation -- He could also be having a leukemoid reaction from steroids given by EMS -- Treating pneumonia and COPD exacerbation as noted -- Follow daily CBC with differential  Sinus tachycardia -- Suspect side effect of continuous albuterol  treatment given in the ED -- Monitor closely on telemetry -- Treating causative agents as noted above -- follow blood cultures    DVT prophylaxis: enoxaparin    Code Status: Full   Family Communication:   Disposition Plan: home   Consults called: Interventional radiology Admission status: Inpatient Time spent: 64 mins  Level of care: Telemetry Afton Louder MD [facility] How to contact the [facility] Attending or Consulting provider 7A - 7P or covering provider during after hours 7P -7A, for this patient?  Check the care team in [facility] and look for a) attending/consulting [facility] provider listed  and b) the [facility] team listed Log into [email or web address] and use [place]'s universal password to access. If you do not have the password, please contact the hospital operator. Locate the [facility] provider you are looking for under [facility] and page to a number that you can be directly reached. If you still have difficulty reaching the provider, please page the [facility] (Director on Call) for the Hospitalists listed on amion for assistance.   If 7PM-7AM, please contact night-coverage [email or web address] Password [email or web address]  01/27/2024, 10:38 AM

## 2024-01-27 NOTE — ED Notes (Signed)
 Hospitalist at bedside

## 2024-01-27 NOTE — ED Provider Notes (Signed)
 [facility] AT [facility] Provider Note   CSN: [id number] Arrival date & time: 01/27/24  0745     Patient presents with: Shortness of Breath   Victor Shields is a 73 y.o. male.   HPI    73 year old male with history of COPD, hypertension, hyperlipidemia and peripheral edema comes in with chief complaint of shortness of breath.  Patient states that he has been feeling off for the last several days.  He had swelling in his legs that was noted by PCP.  He was put on Lasix  and that started leading to dizziness.  He was seen by PCP again and they thought he had vertigo and put him on more medications.  He has been feeling short of breath with minimal exertion.   According to the wife, who is at the bedside, patient shortness of breath has worsened in the last 3 or 4 days.  He gets short of breath with minimal exertion.  He has also had some balance issues because of suspected medication side effects.  This morning, she decided to call 911.  Patient is wheezing.  Patient complains of slight sore throat, but no new cough, fevers or chills.    Prior to Admission medications   Medication Sig Start Date End Date Taking? Authorizing Provider  albuterol  (VENTOLIN  HFA) 108 (90 Base) MCG/ACT inhaler Inhale 2 puffs into the lungs every 6 (six) hours as needed for wheezing or shortness of breath. **NEEDS TO BE SEEN BEFORE NEXT REFILL** 01/02/24   Lavell Lye A, FNP  Budeson-Glycopyrrol-Formoterol  (BREZTRI  AEROSPHERE) 160-9-4.8 MCG/ACT AERO Inhale 2 puffs into the lungs 2 (two) times daily. 04/22/23   Lavell Lye A, FNP  cetirizine  (ZYRTEC ) 10 MG tablet Take 1 tablet (10 mg total) by mouth daily. 01/02/24   Lavell Lye LABOR, FNP  fluticasone  (FLONASE ) 50 MCG/ACT nasal spray Place 2 sprays into both nostrils daily. 01/02/24   Lavell Lye A, FNP  furosemide  (LASIX ) 20 MG tablet Take 1 tablet (20 mg total) by mouth daily. 01/02/24   Lavell Lye LABOR, FNP  meclizine   (ANTIVERT ) 25 MG tablet Take 1 tablet (25 mg total) by mouth 3 (three) times daily as needed for dizziness. 01/09/24   Lavell Lye A, FNP  ondansetron  (ZOFRAN ) 4 MG tablet Take 1 tablet (4 mg total) by mouth every 8 (eight) hours as needed for nausea or vomiting. 01/09/24   Lavell Lye A, FNP  rosuvastatin  (CRESTOR ) 5 MG tablet Take 1 tablet (5 mg total) by mouth daily. 01/03/24   Lavell Lye LABOR, FNP  sildenafil  (VIAGRA ) 100 MG tablet Take 0.5-1 tablets (50-100 mg total) by mouth daily as needed for erectile dysfunction. 01/02/24   Lavell Lye A, FNP  trolamine salicylate (ASPERCREME) 10 % cream Apply 1 application topically as needed for muscle pain.    [provider]    Allergies: Penicillins    Review of Systems  All other systems reviewed and are negative.   Updated Vital Signs BP (!) 159/72 (BP Location: Right Arm)   Pulse (!) 111   Temp 98.1 F (36.7 C) (Oral)   Resp (!) 24   Ht 5' 8 (1.727 m)   Wt 61.4 kg   SpO2 93%   BMI 20.58 kg/m   Physical Exam Vitals and nursing note reviewed.  Constitutional:      Appearance: He is well-developed.  HENT:     Head: Atraumatic.  Cardiovascular:     Rate and Rhythm: Normal rate.  Pulmonary:  Effort: Pulmonary effort is normal.     Breath sounds: Decreased breath sounds and wheezing present. No rales.  Musculoskeletal:     Cervical back: Neck supple.  Skin:    General: Skin is warm.  Neurological:     Mental Status: He is alert and oriented to person, place, and time.     (all labs ordered are listed, but only abnormal results are displayed) Labs Reviewed  BASIC METABOLIC PANEL WITH GFR - Abnormal; Notable for the following components:      Result Value   Glucose, Bld 105 (*)    Creatinine, Ser 0.55 (*)    Calcium  8.6 (*)    All other components within normal limits  CBC WITH DIFFERENTIAL/PLATELET - Abnormal; Notable for the following components:   WBC 11.9 (*)    Neutro Abs 10.2 (*)    All other  components within normal limits  RESP PANEL BY RT-PCR (RSV, FLU A&B, COVID)  RVPGX2  MAGNESIUM   BRAIN NATRIURETIC PEPTIDE  TROPONIN I (HIGH SENSITIVITY)    EKG: EKG Interpretation Date/Time:  Monday January 27 2024 07:54:22 EDT Ventricular Rate:  106 PR Interval:    QRS Duration:  97 QT Interval:  335 QTC Calculation: 445 R Axis:   70  Text Interpretation: Atrial fibrillation vs sinus tachycardia Borderline repolarization abnormality Confirmed by Charlyn Sora ([id number]) on 01/27/2024 9:01:31 AM  Radiology: [facility] Chest Port 1 View Result Date: 01/27/2024 CLINICAL DATA:  Shortness of breath, wheezing, low O2 sats. EXAM: PORTABLE CHEST 1 VIEW COMPARISON:  02/15/2020. FINDINGS: Trachea is midline. Heart size stable. Large right pleural effusion with right basilar collapse/consolidation. Streaky atelectasis in the left lung base. IMPRESSION: Large right pleural effusion with collapse/consolidation in the right lung base, possibly due to pneumonia. Difficult exclude underlying malignancy. Followup PA and lateral chest X-ray is recommended in 3-4 weeks following trial of antibiotic therapy to ensure resolution and exclude underlying malignancy. Electronically Signed   By: Newell Eke M.D.   On: 01/27/2024 09:25     .Critical Care  Performed by: Charlyn Sora, MD Authorized by: Charlyn Sora, MD   Critical care provider statement:    Critical care time (minutes):  38   Critical care was necessary to treat or prevent imminent or life-threatening deterioration of the following conditions:  Respiratory failure   Critical care was time spent personally by me on the following activities:  Development of treatment plan with patient or surrogate, discussions with consultants, evaluation of patient's response to treatment, examination of patient, ordering and review of laboratory studies, ordering and review of radiographic studies, ordering and performing treatments and interventions, pulse  oximetry, re-evaluation of patient's condition, review of old charts and obtaining history from patient or surrogate    Medications Ordered in the ED  albuterol  (PROVENTIL ) (2.5 MG/3ML) 0.083% nebulizer solution (10 mg/hr Nebulization Given 01/27/24 0932)  ipratropium (ATROVENT ) nebulizer solution 0.5 mg (0.5 mg Nebulization Given 01/27/24 0932)                                    Medical Decision Making Amount and/or Complexity of Data Reviewed Labs: ordered. Radiology: ordered.  Risk Prescription drug management. Decision regarding hospitalization.   This patient presents to the ED with chief complaint(s) of shortness of breath with pertinent past medical history of COPD, peripheral edema, tobacco use disorder.additionally, patient also reports that since being put on Lasix , he has been having dizziness and more  difficulty with balance.  The complaint involves an extensive differential diagnosis and also carries with it a high risk of complications and morbidity.    The differential diagnosis includes : COPD exacerbation, CHF, pulmonary edema, pneumonia, viral illness, acute coronary syndrome and pulmonary embolism, arrhythmia Per paramedics, patient was hypoxic.  He received breathing treatments prior to ED arrival.  Patient does not use oxygen at home.  Additionally, patient also reports having some dizziness and balance issues since being put on Lasix .  Differential diagnosis for that includes electrolyte abnormality, stroke, orthostatic dizziness.  The initial plan is to get basic labs, x-ray of the chest, orthostatics and reassess the patient.  Will also give him breathing treatments.   Additional history obtained: Additional history obtained from spouse Records reviewed previous admission documents and Primary Care Documents  Independent labs interpretation:  The following labs were independently interpreted: CBC reveals slight elevation in white count at 11.9.  Magnesium  is  normal.  Metabolic profile is normal.  Independent visualization and interpretation of imaging: - I independently visualized the following imaging with scope of interpretation limited to determining acute life threatening conditions related to emergency care: X-ray of the chest, which revealed large right-sided pleural effusion.  Treatment and Reassessment: Patient reassessed.  He is getting his nebulizer treatment right now.  He indicates that he has no new cough, there is no phlegm production with cough and no fevers. I discussed with him the pleural effusion finding and that we will admit him to the hospital.  Patient has history of tobacco use disorder.  Currently, it does not appear that pneumonia is the underlying cause.  Given the leg swelling, pleural effusion could be because of CHF.  Other possibility includes malignancy along with infection.  We will not start him on antibiotics at this time, will defer that to medicine team.  Smoking cessation instruction/counseling given:  counseled patient on the dangers of tobacco use, advised patient to stop smoking, and reviewed strategies to maximize success   Final diagnoses:  Acute hypoxemic respiratory failure (HCC)  Pleural effusion  Shortness of breath    ED Discharge Orders     None          Charlyn Sora, MD 01/27/24 [phone]

## 2024-01-27 NOTE — Procedures (Signed)
 PROCEDURE SUMMARY:  Successful image-guided right thoracentesis. Yielded 1.5 liters of clear yellow fluid - procedure stopped at this amount per IR protocol for first time thoracentesis. Residual fluid remains on post procedure US  and is likely amenable to repeat thoracentesis later this week if indicated. Patient tolerated procedure well. EBL < 1 mL No immediate complications.  Specimen was sent for labs. Post procedure CXR shows no pneumothorax.  Please see imaging section of Epic for full dictation.  Clotilda DELENA Hesselbach PA-C 01/27/2024 11:36 AM

## 2024-01-27 NOTE — ED Triage Notes (Signed)
 Pt BIB [facility] from home for c/o sob  Pt was found by ems with exp wheezes and O2 sats in mids 80's  Pt given albuterol  by ems and sats increased to mid 90's on [place] 3L of O2  Pt given 125mg  solumedrol IV by ems  Pt states he started feeling sob x 3 days ago; pt has significant swelling to left hand

## 2024-01-27 NOTE — Hospital Course (Signed)
 73 year old male with hypertension, hyperlipidemia, COPD, ongoing tobacco use, presented to the emergency department with acute onset shortness of breath and peripheral edema.  EMS found him to be hypoxic at home with oxygen saturation in the mid 80s.  He was wheezing and coughing.  His baseline supplemental oxygen was increased to 3 L with some improvement in oxygen saturation to the mid 90s.  He was given 125 mg of Solu-Medrol  IV by EMS.  He says that he was recently started on Lasix  by his PCP and after starting it was having symptoms of dizziness.  Over the past 2 to 3 days he has had progressive shortness of breath wheezing coughing and weakness.  Patient reports shortness of breath with minimal exertion which is not his baseline.  He has some shortness of breath symptoms but no fever chills or chest pain.  His chest x-ray showed findings of a large right pleural effusion with compressive atelectasis of the right lung and possible pneumonia.  He had an elevated BNP.  White blood cell count 11.9.  Respiratory panel negative for influenza RSV and SARS coronavirus 2.  Hospital admission was requested for management. Patient was started on IV Solu-Medrol  and bronchodilators with gradual improvement.  He was weaned off oxygen.  He underwent thoracocentesis removing 1.5 L.  He was given a dose of IV furosemide  for his fluid overload.  Unfortunately, the patient developed atrial fibrillation with RVR on the evening of 01/29/2024.  He was placed on diltiazem  drip.  His rates remained elevated in the 140s.  Cardiology was consulted to assist with management.

## 2024-01-27 NOTE — Progress Notes (Signed)
 Right Thoracentesis procedure complete at this time and 1.5 Liters of clear yellow pleural fluid removed with labs collected and taken to lab to be processed. Patient verbalized understanding of post procedure instructions and transported via hospital bed to xray at this time for post chest xray with no acute distress noted.

## 2024-01-27 NOTE — Progress Notes (Signed)
 Patient returned from US  thoracentesis after 1.5 liter fluid removed from lung. Patient breathing much better at present time.Oxygen sats on 2 liters via [place] 93% rr22. Patient able to speak in full complete sentences and appears in no acute distress.Patient medicated per provider orders will continue to monitor as directed.

## 2024-01-27 NOTE — Plan of Care (Signed)
   Problem: Education: Goal: Knowledge of General Education information will improve Description: Including pain rating scale, medication(s)/side effects and non-pharmacologic comfort measures Outcome: Progressing   Problem: Clinical Measurements: Goal: Ability to maintain clinical measurements within normal limits will improve Outcome: Progressing Goal: Diagnostic test results will improve Outcome: Progressing

## 2024-01-27 NOTE — Progress Notes (Signed)
 Pt. Developed skin tear to lower right arm during movement to room bed. This nurse cleaned tear with saline and applied Xeroform dressing covered with gauze and wrapped in kerlix.

## 2024-01-28 ENCOUNTER — Inpatient Hospital Stay (HOSPITAL_COMMUNITY)

## 2024-01-28 DIAGNOSIS — J90 Pleural effusion, not elsewhere classified: Secondary | ICD-10-CM | POA: Diagnosis not present

## 2024-01-28 DIAGNOSIS — J441 Chronic obstructive pulmonary disease with (acute) exacerbation: Secondary | ICD-10-CM | POA: Diagnosis not present

## 2024-01-28 DIAGNOSIS — R0603 Acute respiratory distress: Secondary | ICD-10-CM | POA: Diagnosis not present

## 2024-01-28 DIAGNOSIS — D72829 Elevated white blood cell count, unspecified: Secondary | ICD-10-CM | POA: Diagnosis not present

## 2024-01-28 LAB — CBC WITH DIFFERENTIAL/PLATELET
Abs Immature Granulocytes: 0.05 K/uL (ref 0.00–0.07)
Basophils Absolute: 0 K/uL (ref 0.0–0.1)
Basophils Relative: 0 %
Eosinophils Absolute: 0 K/uL (ref 0.0–0.5)
Eosinophils Relative: 0 %
HCT: 40.8 % (ref 39.0–52.0)
Hemoglobin: 13.3 g/dL (ref 13.0–17.0)
Immature Granulocytes: 1 %
Lymphocytes Relative: 8 %
Lymphs Abs: 0.8 K/uL (ref 0.7–4.0)
MCH: 31.7 pg (ref 26.0–34.0)
MCHC: 32.6 g/dL (ref 30.0–36.0)
MCV: 97.1 fL (ref 80.0–100.0)
Monocytes Absolute: 0.2 K/uL (ref 0.1–1.0)
Monocytes Relative: 2 %
Neutro Abs: 8.5 K/uL — ABNORMAL HIGH (ref 1.7–7.7)
Neutrophils Relative %: 89 %
Platelets: 249 K/uL (ref 150–400)
RBC: 4.2 MIL/uL — ABNORMAL LOW (ref 4.22–5.81)
RDW: 14 % (ref 11.5–15.5)
WBC: 9.5 K/uL (ref 4.0–10.5)
nRBC: 0 % (ref 0.0–0.2)

## 2024-01-28 LAB — BASIC METABOLIC PANEL WITH GFR
Anion gap: 7 (ref 5–15)
BUN: 13 mg/dL (ref 8–23)
CO2: 25 mmol/L (ref 22–32)
Calcium: 8.3 mg/dL — ABNORMAL LOW (ref 8.9–10.3)
Chloride: 108 mmol/L (ref 98–111)
Creatinine, Ser: 0.49 mg/dL — ABNORMAL LOW (ref 0.61–1.24)
GFR, Estimated: >60 mL/min (ref >60)
Glucose, Bld: 133 mg/dL — ABNORMAL HIGH (ref 70–99)
Potassium: 3.8 mmol/L (ref 3.5–5.1)
Sodium: 140 mmol/L (ref 135–145)

## 2024-01-28 LAB — MAGNESIUM: Magnesium: 1.8 mg/dL (ref 1.7–2.4)

## 2024-01-28 MED ORDER — MAGNESIUM SULFATE 2 GM/50ML IV SOLN
2.0000 g | Freq: Once | INTRAVENOUS | Status: AC
  Administered 2024-01-28: 2 g via INTRAVENOUS
  Filled 2024-01-28: qty 50

## 2024-01-28 NOTE — Progress Notes (Signed)
   01/28/24 1230  TOC Brief Assessment  Insurance and Status Reviewed  Patient has primary care physician Yes  Home environment has been reviewed Home w/ spouse  Prior level of function: Independent  Prior/Current Home Services No current home services  Social Drivers of Health Review SDOH reviewed no interventions necessary  Readmission risk has been reviewed Yes  Transition of care needs transition of care needs identified, TOC will continue to follow

## 2024-01-28 NOTE — Plan of Care (Signed)

## 2024-01-28 NOTE — Progress Notes (Signed)
 PROGRESS NOTE   Victor Shields  FMW:[id number] DOB: January 21, 1951 DOA: 01/27/2024 PCP: Lavell Bari LABOR, FNP   Chief Complaint  Patient presents with   Shortness of Breath   Level of care: Telemetry  Brief Admission History:  73 year old male with hypertension, hyperlipidemia, COPD, ongoing tobacco use, presented to the emergency department with acute onset shortness of breath and peripheral edema.  EMS found him to be hypoxic at home with oxygen saturation in the mid 80s.  He was wheezing and coughing.  His baseline supplemental oxygen was increased to 3 L with some improvement in oxygen saturation to the mid 90s.  He was given 125 mg of Solu-Medrol  IV by EMS.  He says that he was recently started on Lasix  by his PCP and after starting it was having symptoms of dizziness.  Over the past 2 to 3 days he has had progressive shortness of breath wheezing coughing and weakness.  Patient reports shortness of breath with minimal exertion which is not his baseline.  He has some shortness of breath symptoms but no fever chills or chest pain.  His chest x-ray showed findings of a large right pleural effusion with compressive atelectasis of the right lung and possible pneumonia.  He had an elevated BNP.  White blood cell count 11.9.  Respiratory panel negative for influenza RSV and SARS coronavirus 2.  Hospital admission was requested for management.   Assessment and Plan:  Acute respiratory failure with hypoxia -- Secondary to large right pleural effusion -- Secondary to acute COPD exacerbation -- Orders immediately placed for IR evaluation for possible thoracentesis to evacuate pleural effusion compressing right lung -- Discussed with patient and he was agreeable to proceed with procedure -- thoracentesis completed 7/21 and 1.5 L serous fluid removed, fluid sent for testing  -- Treating COPD exacerbation as noted below -- added incentive spirometry 7/22   Acute COPD exacerbation -- Continue  antibiotics as ordered: Ceftriaxone  and doxycycline  -- IV steroids as ordered around-the-clock -- Scheduled bronchodilators as ordered -- Supplemental oxygen with goal pulse ox greater than 88% -- Wean oxygen as able -- Cough suppressants and mucolytic's ordered -- added flutter valve 7/22    Tobacco -- Nicotine  patch ordered for cravings -- Briefly counseled on tobacco cessation at bedside -- Nurse to provide tobacco cessation education as well   Leukocytosis - resolved  -- Suspect secondary to community-acquired pneumonia and COPD exacerbation -- He could also be having a leukemoid reaction from steroids given by EMS -- Treating pneumonia and COPD exacerbation as noted -- WBC down to 9.5    Sinus tachycardia -- Suspect side effect of continuous albuterol  treatment given in the ED -- Monitor closely on telemetry -- Treating causative agents as noted above -- follow blood cultures    DVT prophylaxis: enoxaparin  Code Status: Full  Family Communication: wife updated at bedside  Disposition: home   Consultants:  I.R.  Procedures:  Thoracentesis (right) 7/21 - 1.5 L removed  Antimicrobials:  CTX 7/21>> Doxy 7/21>>   Subjective: Pt reports less SOB and breathing better after thoracentesis   Objective: Vitals:   01/28/24 0808 01/28/24 0814 01/28/24 1219 01/28/24 1450  BP:   (!) 111/45   Pulse:   80   Resp:      Temp:   97.6 F (36.4 C)   TempSrc:   Axillary   SpO2: 99% 100% 100% 96%  Weight:      Height:        Intake/Output Summary (Last 24 hours)  at 01/28/2024 1532 Last data filed at 01/28/2024 1230 Gross per 24 hour  Intake 1200 ml  Output 1000 ml  Net 200 ml   Filed Weights   01/27/24 0755  Weight: 61.4 kg   Examination:  General exam: Appears calm and comfortable  Respiratory system: better air movement, less expiratory wheezing, diminished BS RLL Cardiovascular system: normal S1 & S2 heard. No JVD, murmurs, rubs, gallops or clicks. No pedal  edema. Gastrointestinal system: Abdomen is nondistended, soft and nontender. No organomegaly or masses felt. Normal bowel sounds heard. Central nervous system: Alert and oriented. No focal neurological deficits. Extremities: Symmetric 5 x 5 power. Skin: No rashes, lesions or ulcers. Psychiatry: Judgement and insight appear normal. Mood & affect appropriate.   Data Reviewed: I have personally reviewed following labs and imaging studies  CBC: Recent Labs  Lab 01/27/24 0858 01/28/24 0449  WBC 11.9* 9.5  NEUTROABS 10.2* 8.5*  HGB 14.7 13.3  HCT 45.4 40.8  MCV 97.0 97.1  PLT 275 249    Basic Metabolic Panel: Recent Labs  Lab 01/27/24 0858 01/28/24 0449  NA 141 140  K 4.2 3.8  CL 104 108  CO2 25 25  GLUCOSE 105* 133*  BUN 10 13  CREATININE 0.55* 0.49*  CALCIUM  8.6* 8.3*  MG 1.7 1.8    CBG: No results for input(s): GLUCAP in the last 168 hours.  Recent Results (from the past 240 hours)  Resp panel by RT-PCR (RSV, Flu A&B, Covid) Anterior Nasal Swab     Status: None   Collection Time: 01/27/24  9:15 AM   Specimen: Anterior Nasal Swab  Result Value Ref Range Status   SARS Coronavirus 2 by RT PCR NEGATIVE NEGATIVE Final    Comment: (NOTE) SARS-CoV-2 target nucleic acids are NOT DETECTED.  The SARS-CoV-2 RNA is generally detectable in upper respiratory specimens during the acute phase of infection. The lowest concentration of SARS-CoV-2 viral copies this assay can detect is 138 copies/mL. A negative result does not preclude SARS-Cov-2 infection and should not be used as the sole basis for treatment or other patient management decisions. A negative result may occur with  improper specimen collection/handling, submission of specimen other than nasopharyngeal swab, presence of viral mutation(s) within the areas targeted by this assay, and inadequate number of viral copies(<138 copies/mL). A negative result must be combined with clinical observations, patient history,  and epidemiological information. The expected result is Negative.  Fact Sheet for Patients:  BloggerCourse.com  Fact Sheet for Healthcare Providers:  SeriousBroker.it  This test is no t yet approved or cleared by the [place]  FDA and  has been authorized for detection and/or diagnosis of SARS-CoV-2 by FDA under an Emergency Use Authorization (EUA). This EUA will remain  in effect (meaning this test can be used) for the duration of the COVID-19 declaration under Section 564(b)(1) of the Act, 21 U.S.C.section 360bbb-3(b)(1), unless the authorization is terminated  or revoked sooner.       Influenza A by PCR NEGATIVE NEGATIVE Final   Influenza B by PCR NEGATIVE NEGATIVE Final    Comment: (NOTE) The Xpert Xpress SARS-CoV-2/FLU/RSV plus assay is intended as an aid in the diagnosis of influenza from Nasopharyngeal swab specimens and should not be used as a sole basis for treatment. Nasal washings and aspirates are unacceptable for Xpert Xpress SARS-CoV-2/FLU/RSV testing.  Fact Sheet for Patients: BloggerCourse.com  Fact Sheet for Healthcare Providers: SeriousBroker.it  This test is not yet approved or cleared by the [place]  FDA and  has been authorized for detection and/or diagnosis of SARS-CoV-2 by FDA under an Emergency Use Authorization (EUA). This EUA will remain in effect (meaning this test can be used) for the duration of the COVID-19 declaration under Section 564(b)(1) of the Act, 21 U.S.C. section 360bbb-3(b)(1), unless the authorization is terminated or revoked.     Resp Syncytial Virus by PCR NEGATIVE NEGATIVE Final    Comment: (NOTE) Fact Sheet for Patients: BloggerCourse.com  Fact Sheet for Healthcare Providers: SeriousBroker.it  This test is not yet approved or cleared by the [place]  FDA and has  been authorized for detection and/or diagnosis of SARS-CoV-2 by FDA under an Emergency Use Authorization (EUA). This EUA will remain in effect (meaning this test can be used) for the duration of the COVID-19 declaration under Section 564(b)(1) of the Act, 21 U.S.C. section 360bbb-3(b)(1), unless the authorization is terminated or revoked.  Performed at [facility], [address], [place], [place] [address]   Culture, blood (Routine X 2) w Reflex to ID Panel     Status: None (Preliminary result)   Collection Time: 01/27/24 10:39 AM   Specimen: BLOOD  Result Value Ref Range Status   Specimen Description BLOOD BLOOD RIGHT ARM  Final   Special Requests   Final    BOTTLES DRAWN AEROBIC AND ANAEROBIC Blood Culture adequate volume   Culture   Final    NO GROWTH 1 DAY Performed at [facility], [address] [place], [place] [address]    Report Status PENDING  Incomplete  Culture, blood (Routine X 2) w Reflex to ID Panel     Status: None (Preliminary result)   Collection Time: 01/27/24 10:45 AM   Specimen: BLOOD  Result Value Ref Range Status   Specimen Description BLOOD BLOOD LEFT ARM  Final   Special Requests   Final    BOTTLES DRAWN AEROBIC ONLY Blood Culture results may not be optimal due to an inadequate volume of blood received in culture bottles   Culture   Final    NO GROWTH 1 DAY Performed at [facility], [address] [place], [place] [address]    Report Status PENDING  Incomplete     Radiology Studies: Portable chest 1 View Result Date: 01/28/2024 CLINICAL DATA:  Pleural effusion. EXAM: PORTABLE CHEST 1 VIEW COMPARISON:  01/27/2024 FINDINGS: Right base collapse/consolidation with effusion is similar to prior. Left lung is clear. Interstitial markings are diffusely coarsened with chronic features. Cardiopericardial silhouette is at upper limits of normal for size. No acute bony abnormality. IMPRESSION: Right base collapse/consolidation with effusion, similar to prior.  Electronically Signed   By: Camellia Candle M.D.   On: 01/28/2024 06:37   ECHOCARDIOGRAM COMPLETE Result Date: 01/27/2024    ECHOCARDIOGRAM REPORT   Patient Name:   [facility] DAVID Pak Date of Exam: 01/27/2024 Medical Rec #:  [id number]           Height:       68.0 in Accession #:    [id number]          Weight:       135.4 lb Date of Birth:  1951-06-02           BSA:          1.731 m Patient Age:    73 years            BP:           159/72 mmHg Patient Gender: M  HR:           110 bpm. Exam Location:  [facility] Procedure: 2D Echo, Cardiac Doppler and Color Doppler (Both Spectral and Color            Flow Doppler were utilized during procedure). Indications:    dyspnea  History:        Patient has no prior history of Echocardiogram examinations.                 COPD; Risk Factors:Current Smoker.  Sonographer:    Benard Stallion Referring Phys: [phone] Akeria Hedstrom L Tamyka Bezio IMPRESSIONS  1. Left ventricular ejection fraction, by estimation, is 60 to 65%. The left ventricle has normal function. The left ventricle has no regional wall motion abnormalities. Left ventricular diastolic function could not be evaluated.  2. Right ventricular systolic function is normal. The right ventricular size is normal. There is moderately elevated pulmonary artery systolic pressure.  3. The mitral valve is normal in structure. Trivial mitral valve regurgitation. No evidence of mitral stenosis.  4. The aortic valve is normal in structure. Aortic valve regurgitation is not visualized. No aortic stenosis is present.  5. The inferior vena cava is normal in size with greater than 50% respiratory variability, suggesting right atrial pressure of 3 mmHg. FINDINGS  Left Ventricle: Left ventricular ejection fraction, by estimation, is 60 to 65%. The left ventricle has normal function. The left ventricle has no regional wall motion abnormalities. The left ventricular internal cavity size was normal in size. There is  no left  ventricular hypertrophy. Left ventricular diastolic function could not be evaluated. Right Ventricle: The right ventricular size is normal. No increase in right ventricular wall thickness. Right ventricular systolic function is normal. There is moderately elevated pulmonary artery systolic pressure. The tricuspid regurgitant velocity is 3.30 m/s, and with an assumed right atrial pressure of 3 mmHg, the estimated right ventricular systolic pressure is 46.6 mmHg. Left Atrium: Left atrial size was normal in size. Right Atrium: Right atrial size was normal in size. Pericardium: There is no evidence of pericardial effusion. Mitral Valve: The mitral valve is normal in structure. Trivial mitral valve regurgitation. No evidence of mitral valve stenosis. Tricuspid Valve: The tricuspid valve is normal in structure. Tricuspid valve regurgitation is mild . No evidence of tricuspid stenosis. Aortic Valve: The aortic valve is normal in structure. Aortic valve regurgitation is not visualized. No aortic stenosis is present. Aortic valve mean gradient measures 3.0 mmHg. Aortic valve peak gradient measures 6.5 mmHg. Aortic valve area, by VTI measures 3.31 cm. Pulmonic Valve: The pulmonic valve was normal in structure. Pulmonic valve regurgitation is not visualized. No evidence of pulmonic stenosis. Aorta: The aortic root is normal in size and structure. Venous: The inferior vena cava is normal in size with greater than 50% respiratory variability, suggesting right atrial pressure of 3 mmHg. IAS/Shunts: No atrial level shunt detected by color flow Doppler.  LEFT VENTRICLE PLAX 2D LVIDd:         4.95 cm   Diastology LVIDs:         3.70 cm   LV e' medial:  7.51 cm/s LV PW:         0.85 cm   LV e' lateral: 9.25 cm/s LV IVS:        0.85 cm LVOT diam:     2.20 cm LV SV:         65 LV SV Index:   37 LVOT Area:     3.80  cm  RIGHT VENTRICLE RV Basal diam:  3.25 cm RV Mid diam:    3.15 cm RV S prime:     18.60 cm/s TAPSE (M-mode): 2.4 cm  LEFT ATRIUM           Index        RIGHT ATRIUM           Index LA Vol (A4C): 67.0 ml 38.70 ml/m  RA Area:     12.30 cm                                    RA Volume:   32.30 ml  18.66 ml/m  AORTIC VALVE AV Area (Vmax):    3.35 cm AV Area (Vmean):   3.30 cm AV Area (VTI):     3.31 cm AV Vmax:           127.00 cm/s AV Vmean:          83.400 cm/s AV VTI:            0.195 m AV Peak Grad:      6.5 mmHg AV Mean Grad:      3.0 mmHg LVOT Vmax:         112.00 cm/s LVOT Vmean:        72.300 cm/s LVOT VTI:          0.170 m LVOT/AV VTI ratio: 0.87  AORTA Ao Root diam: 3.20 cm TRICUSPID VALVE TR Peak grad:   43.6 mmHg TR Vmax:        330.00 cm/s  SHUNTS Systemic VTI:  0.17 m Systemic Diam: 2.20 cm Wilbert Bihari MD Electronically signed by Wilbert Bihari MD Signature Date/Time: 01/27/2024/6:41:20 PM    Final    US  THORACENTESIS ASP PLEURAL SPACE W/IMG GUIDE Result Date: 01/27/2024 INDICATION: Patient with history of COPD admitted with dyspnea and found to have large right pleural effusion. Request for diagnostic and therapeutic right thoracentesis. EXAM: ULTRASOUND GUIDED RIGHT THORACENTESIS MEDICATIONS: 6 mL 1% lidocaine  COMPLICATIONS: None immediate. PROCEDURE: An ultrasound guided thoracentesis was thoroughly discussed with the patient and questions answered. The benefits, risks, alternatives and complications were also discussed. The patient understands and wishes to proceed with the procedure. Written consent was obtained. Ultrasound was performed to localize and mark an adequate pocket of fluid in the right chest. The area was then prepped and draped in the normal sterile fashion. 1% Lidocaine  was used for local anesthesia. Under ultrasound guidance a 6 Fr Safe-T-Centesis catheter was introduced. Thoracentesis was performed. The catheter was removed and a dressing applied. FINDINGS: A total of approximately 1.5 L of clear yellow fluid was removed. Samples were sent to the laboratory as requested by the clinical team.  IMPRESSION: Successful ultrasound guided right thoracentesis yielding 1.5 L of pleural fluid. Performed by Clotilda Hesselbach, PA-C Electronically Signed   By: Ester Sides M.D.   On: 01/27/2024 12:55   DG Chest 1 View Result Date: 01/27/2024 CLINICAL DATA:  Post thoracentesis. EXAM: CHEST  1 VIEW COMPARISON:  Radiographs 01/27/2024 and 02/15/2020. FINDINGS: 1129 hours. Interval decreased right pleural effusion with improved aeration of the right lung base. No definite pneumothorax. The heart size and mediastinal contours are stable with mild mediastinal shift to the right. The left lung appears clear. The bones appear unremarkable. IMPRESSION: Interval decreased right pleural effusion with improved aeration of the right lung base but incomplete re-expansion of the right lung. No definite  pneumothorax. Electronically Signed   By: Elsie Perone M.D.   On: 01/27/2024 11:59   DG Chest Port 1 View Result Date: 01/27/2024 CLINICAL DATA:  Shortness of breath, wheezing, low O2 sats. EXAM: PORTABLE CHEST 1 VIEW COMPARISON:  02/15/2020. FINDINGS: Trachea is midline. Heart size stable. Large right pleural effusion with right basilar collapse/consolidation. Streaky atelectasis in the left lung base. IMPRESSION: Large right pleural effusion with collapse/consolidation in the right lung base, possibly due to pneumonia. Difficult exclude underlying malignancy. Followup PA and lateral chest X-ray is recommended in 3-4 weeks following trial of antibiotic therapy to ensure resolution and exclude underlying malignancy. Electronically Signed   By: Newell Eke M.D.   On: 01/27/2024 09:25    Scheduled Meds:  budesonide -glycopyrrolate -formoterol   2 puff Inhalation BID   doxycycline   100 mg Oral Q12H   enoxaparin  (LOVENOX ) injection  40 mg Subcutaneous Q24H   fluticasone   2 spray Each Nare Daily   guaiFENesin   1,200 mg Oral BID   ipratropium-albuterol   3 mL Nebulization TID   loratadine   10 mg Oral Daily    methylPREDNISolone  (SOLU-MEDROL ) injection  60 mg Intravenous BID   metoprolol  tartrate  2.5 mg Intravenous Q6H   nicotine   14 mg Transdermal Daily   rosuvastatin   5 mg Oral Daily   Continuous Infusions:  cefTRIAXone  (ROCEPHIN )  IV 2 g (01/28/24 0836)    LOS: 1 day   Time spent: 56 mins  Roselie Cirigliano Vicci, MD How to contact the [facility] Attending or Consulting provider 7A - 7P or covering provider during after hours 7P -7A, for this patient?  Check the care team in [facility] and look for a) attending/consulting TRH provider listed and b) the TRH team listed Log into [email or web address] to find provider on call.  Locate the TRH provider you are looking for under [facility] and page to a number that you can be directly reached. If you still have difficulty reaching the provider, please page the [facility] (Director on Call) for the Hospitalists listed on amion for assistance.  01/28/2024, 3:32 PM

## 2024-01-29 DIAGNOSIS — Z720 Tobacco use: Secondary | ICD-10-CM | POA: Diagnosis not present

## 2024-01-29 DIAGNOSIS — J9601 Acute respiratory failure with hypoxia: Secondary | ICD-10-CM | POA: Diagnosis not present

## 2024-01-29 DIAGNOSIS — J441 Chronic obstructive pulmonary disease with (acute) exacerbation: Secondary | ICD-10-CM | POA: Diagnosis not present

## 2024-01-29 DIAGNOSIS — I5031 Acute diastolic (congestive) heart failure: Secondary | ICD-10-CM | POA: Diagnosis not present

## 2024-01-29 LAB — CBC WITH DIFFERENTIAL/PLATELET
Abs Immature Granulocytes: 0.12 K/uL — ABNORMAL HIGH (ref 0.00–0.07)
Basophils Absolute: 0 K/uL (ref 0.0–0.1)
Basophils Relative: 0 %
Eosinophils Absolute: 0 K/uL (ref 0.0–0.5)
Eosinophils Relative: 0 %
HCT: 36.5 % — ABNORMAL LOW (ref 39.0–52.0)
Hemoglobin: 12.3 g/dL — ABNORMAL LOW (ref 13.0–17.0)
Immature Granulocytes: 1 %
Lymphocytes Relative: 7 %
Lymphs Abs: 1.3 K/uL (ref 0.7–4.0)
MCH: 31.5 pg (ref 26.0–34.0)
MCHC: 33.7 g/dL (ref 30.0–36.0)
MCV: 93.6 fL (ref 80.0–100.0)
Monocytes Absolute: 0.5 K/uL (ref 0.1–1.0)
Monocytes Relative: 3 %
Neutro Abs: 17.3 K/uL — ABNORMAL HIGH (ref 1.7–7.7)
Neutrophils Relative %: 89 %
Platelets: UNDETERMINED K/uL (ref 150–400)
RBC: 3.9 MIL/uL — ABNORMAL LOW (ref 4.22–5.81)
RDW: 14 % (ref 11.5–15.5)
WBC: 19.3 K/uL — ABNORMAL HIGH (ref 4.0–10.5)
nRBC: 0 % (ref 0.0–0.2)

## 2024-01-29 LAB — BASIC METABOLIC PANEL WITH GFR
Anion gap: 8 (ref 5–15)
BUN: 18 mg/dL (ref 8–23)
CO2: 21 mmol/L — ABNORMAL LOW (ref 22–32)
Calcium: 8.1 mg/dL — ABNORMAL LOW (ref 8.9–10.3)
Chloride: 110 mmol/L (ref 98–111)
Creatinine, Ser: 0.47 mg/dL — ABNORMAL LOW (ref 0.61–1.24)
GFR, Estimated: >60 mL/min (ref >60)
Glucose, Bld: 113 mg/dL — ABNORMAL HIGH (ref 70–99)
Potassium: 4.4 mmol/L (ref 3.5–5.1)
Sodium: 139 mmol/L (ref 135–145)

## 2024-01-29 LAB — LACTATE DEHYDROGENASE: LDH: 263 U/L — ABNORMAL HIGH (ref 98–192)

## 2024-01-29 LAB — MAGNESIUM: Magnesium: 2.3 mg/dL (ref 1.7–2.4)

## 2024-01-29 LAB — PATHOLOGIST SMEAR REVIEW

## 2024-01-29 MED ORDER — FUROSEMIDE 10 MG/ML IJ SOLN
40.0000 mg | Freq: Once | INTRAMUSCULAR | Status: AC
  Administered 2024-01-29: 40 mg via INTRAVENOUS
  Filled 2024-01-29: qty 4

## 2024-01-29 MED ORDER — BUDESONIDE 0.5 MG/2ML IN SUSP
0.5000 mg | Freq: Two times a day (BID) | RESPIRATORY_TRACT | Status: DC
  Administered 2024-01-29 – 2024-02-04 (×12): 0.5 mg via RESPIRATORY_TRACT
  Filled 2024-01-29 (×12): qty 2

## 2024-01-29 MED ORDER — CHLORHEXIDINE GLUCONATE CLOTH 2 % EX PADS
6.0000 | MEDICATED_PAD | Freq: Every day | CUTANEOUS | Status: DC
  Administered 2024-01-30 – 2024-02-04 (×5): 6 via TOPICAL

## 2024-01-29 MED ORDER — DILTIAZEM LOAD VIA INFUSION
10.0000 mg | Freq: Once | INTRAVENOUS | Status: AC
  Administered 2024-01-29: 10 mg via INTRAVENOUS
  Filled 2024-01-29: qty 10

## 2024-01-29 MED ORDER — ARFORMOTEROL TARTRATE 15 MCG/2ML IN NEBU
15.0000 ug | INHALATION_SOLUTION | Freq: Two times a day (BID) | RESPIRATORY_TRACT | Status: DC
  Administered 2024-01-29 – 2024-02-04 (×12): 15 ug via RESPIRATORY_TRACT
  Filled 2024-01-29 (×12): qty 2

## 2024-01-29 MED ORDER — DILTIAZEM HCL-DEXTROSE 125-5 MG/125ML-% IV SOLN (PREMIX)
5.0000 mg/h | INTRAVENOUS | Status: DC
  Administered 2024-01-29: 5 mg/h via INTRAVENOUS
  Administered 2024-01-30: 15 mg/h via INTRAVENOUS
  Administered 2024-01-30: 12.5 mg/h via INTRAVENOUS
  Administered 2024-01-30 – 2024-01-31 (×2): 15 mg/h via INTRAVENOUS
  Filled 2024-01-29 (×6): qty 125

## 2024-01-29 NOTE — TOC Progression Note (Signed)
 Transition of Care [facility]) - Progression Note    Patient Details  Name: Victor Shields MRN: [id number] Date of Birth: [birth year]  [facility] CM/SW Contact  Hoy DELENA Bigness, LCSW Phone Number: 01/29/2024, 2:36 PM  Clinical Narrative:    Met w/ pt and wife at bedside and confirmed plan to return home with HHPT. Pt reports having HH in the past and would like to use the same agency if possible. Per chart review pt has services with [facility] in 2021. HHPT has been arranged with [facility]. HH order will need to be placed prior to discharge,  CSW confirmed with pt that he is not on O2 at home. TOC will follow for new O2 requirement.    Expected Discharge Plan: Home w Home Health Services Barriers to Discharge: No Barriers Identified               Expected Discharge Plan and Services In-house Referral: Clinical Social Work Discharge Planning Services: NA Post Acute Care Choice: Home Health Living arrangements for the past 2 months: Single Family Home                           HH Arranged: PT HH Agency: [facility] Date HH Agency Contacted: 01/29/24 Time HH Agency Contacted: 1435 Representative spoke with at [facility] Agency: [place]   Social Drivers of Health (SDOH) Interventions SDOH Screenings   Food Insecurity: No Food Insecurity (01/27/2024)  Housing: Low Risk  (01/27/2024)  Transportation Needs: No Transportation Needs (01/27/2024)  Utilities: Not At Risk (01/27/2024)  Alcohol Screen: Low Risk  (12/03/2023)  Depression (PHQ2-9): Low Risk  (01/02/2024)  Financial Resource Strain: Low Risk  (12/03/2023)  Physical Activity: Insufficiently Active (12/03/2023)  Social Connections: Moderately Isolated (01/27/2024)  Stress: No Stress Concern Present (12/03/2023)  Tobacco Use: High Risk (01/27/2024)  Health Literacy: Adequate Health Literacy (12/03/2023)    Readmission Risk Interventions    01/28/2024   12:30 PM  Readmission Risk Prevention Plan   Post Dischage Appt Complete  Medication Screening Complete  Transportation Screening Complete

## 2024-01-29 NOTE — Progress Notes (Signed)
 PROGRESS NOTE  Victor Shields FMW:[id number] DOB: [birth year] DOA: 01/27/2024 PCP: Lavell Bari LABOR, FNP  Brief History:  73 year old male with hypertension, hyperlipidemia, COPD, ongoing tobacco use, presented to the emergency department with acute onset shortness of breath and peripheral edema.  EMS found him to be hypoxic at home with oxygen saturation in the mid 80s.  He was wheezing and coughing.  His baseline supplemental oxygen was increased to 3 L with some improvement in oxygen saturation to the mid 90s.  He was given 125 mg of Solu-Medrol  IV by EMS.  He says that he was recently started on Lasix  by his PCP and after starting it was having symptoms of dizziness.  Over the past 2 to 3 days he has had progressive shortness of breath wheezing coughing and weakness.  Patient reports shortness of breath with minimal exertion which is not his baseline.  He has some shortness of breath symptoms but no fever chills or chest pain.  His chest x-ray showed findings of a large right pleural effusion with compressive atelectasis of the right lung and possible pneumonia.  He had an elevated BNP.  White blood cell count 11.9.  Respiratory panel negative for influenza RSV and SARS coronavirus 2.  Hospital admission was requested for management.   Assessment/Plan: Acute respiratory failure with hypoxia -- Secondary to large right pleural effusion -- Secondary to acute COPD exacerbation and CHF -- thoracentesis completed 7/21 and 1.5 L serous fluid removed, fluid sent for testing  -- added incentive spirometry 7/22 - initially on 2L>>RA   Acute COPD exacerbation -- continue IV solumedrol -continue duonebs -add pulmicort  -add brovana  -- added flutter valve 7/22   Acute HFpEF -7/21 Echo EF 60-65%, no wMA, normal RVF, mod elevated PASP -s/p thora 7/21 -lasix  IV 40 x 1 on 7/23 -check ReDS -daily weights -I/Os   Tobacco -- Nicotine  patch ordered for cravings --cessation discussed    Sinus tachycardia -- due to BDs and medical acuity -- Monitor on telemetry -- follow blood cultures --neg  -- overall improved  Mixed Hyperlipidemia -continue statin      Family Communication:   spouse at bedside 7/23  Consultants:  none  Code Status:  FULL   DVT Prophylaxis:   Lovenox    Procedures: As Listed in Progress Note Above  Antibiotics: Ceftriaxone  7/21>> Doxy 7/21>>        Subjective: Pt states sob is improving.  Denies n/v/d, abd pain, cp, hemoptysis  Objective: Vitals:   01/29/24 0648 01/29/24 0722 01/29/24 1300 01/29/24 1349  BP: (!) 130/45  (!) 118/44   Pulse: 80  88   Resp:   18   Temp:   (!) 97.4 F (36.3 C)   TempSrc:   Oral   SpO2: 100% 100% 100% 100%  Weight:      Height:        Intake/Output Summary (Last 24 hours) at 01/29/2024 1723 Last data filed at 01/29/2024 1312 Gross per 24 hour  Intake 720 ml  Output 300 ml  Net 420 ml   Weight change:  Exam:  General:  Pt is alert, follows commands appropriately, not in acute distress HEENT: No icterus, No thrush, No neck mass, [place]/AT Cardiovascular: RRR, S1/S2, no rubs, no gallops +JVD Respiratory: diminished BS.  Bibasilar rales.  Bibasilar wheeze Abdomen: Soft/+BS, non tender, non distended, no guarding Extremities: 2 + LE edema, No lymphangitis, No petechiae, No rashes, no synovitis   Data Reviewed: I  have personally reviewed following labs and imaging studies Basic Metabolic Panel: Recent Labs  Lab 01/27/24 0858 01/28/24 0449 01/29/24 0444  NA 141 140 139  K 4.2 3.8 4.4  CL 104 108 110  CO2 25 25 21*  GLUCOSE 105* 133* 113*  BUN 10 13 18   CREATININE 0.55* 0.49* 0.47*  CALCIUM  8.6* 8.3* 8.1*  MG 1.7 1.8 2.3   Liver Function Tests: No results for input(s): AST, ALT, ALKPHOS, BILITOT, PROT, ALBUMIN in the last 168 hours. No results for input(s): LIPASE, AMYLASE in the last 168 hours. No results for input(s): AMMONIA in the last 168  hours. Coagulation Profile: No results for input(s): INR, PROTIME in the last 168 hours. CBC: Recent Labs  Lab 01/27/24 0858 01/28/24 0449 01/29/24 0444  WBC 11.9* 9.5 19.3*  NEUTROABS 10.2* 8.5* 17.3*  HGB 14.7 13.3 12.3*  HCT 45.4 40.8 36.5*  MCV 97.0 97.1 93.6  PLT 275 249 PLATELET CLUMPS NOTED ON SMEAR, UNABLE TO ESTIMATE   Cardiac Enzymes: No results for input(s): CKTOTAL, CKMB, CKMBINDEX, TROPONINI in the last 168 hours. BNP: Invalid input(s): POCBNP CBG: No results for input(s): GLUCAP in the last 168 hours. HbA1C: No results for input(s): HGBA1C in the last 72 hours. Urine analysis:    Component Value Date/Time   APPEARANCEUR Cloudy (A) 05/19/2021 1459   GLUCOSEU Negative 05/19/2021 1459   BILIRUBINUR Negative 05/19/2021 1459   PROTEINUR 1+ (A) 05/19/2021 1459   NITRITE Negative 05/19/2021 1459   LEUKOCYTESUR Negative 05/19/2021 1459   Sepsis Labs: @LABRCNTIP (procalcitonin:4,lacticidven:4) ) Recent Results (from the past 240 hours)  Resp panel by RT-PCR (RSV, Flu A&B, Covid) Anterior Nasal Swab     Status: None   Collection Time: 01/27/24  9:15 AM   Specimen: Anterior Nasal Swab  Result Value Ref Range Status   SARS Coronavirus 2 by RT PCR NEGATIVE NEGATIVE Final    Comment: (NOTE) SARS-CoV-2 target nucleic acids are NOT DETECTED.  The SARS-CoV-2 RNA is generally detectable in upper respiratory specimens during the acute phase of infection. The lowest concentration of SARS-CoV-2 viral copies this assay can detect is 138 copies/mL. A negative result does not preclude SARS-Cov-2 infection and should not be used as the sole basis for treatment or other patient management decisions. A negative result may occur with  improper specimen collection/handling, submission of specimen other than nasopharyngeal swab, presence of viral mutation(s) within the areas targeted by this assay, and inadequate number of viral copies(<138 copies/mL). A  negative result must be combined with clinical observations, patient history, and epidemiological information. The expected result is Negative.  Fact Sheet for Patients:  BloggerCourse.com  Fact Sheet for Healthcare Providers:  SeriousBroker.it  This test is no t yet approved or cleared by the [place]  FDA and  has been authorized for detection and/or diagnosis of SARS-CoV-2 by FDA under an Emergency Use Authorization (EUA). This EUA will remain  in effect (meaning this test can be used) for the duration of the COVID-19 declaration under Section 564(b)(1) of the Act, 21 U.S.C.section 360bbb-3(b)(1), unless the authorization is terminated  or revoked sooner.       Influenza A by PCR NEGATIVE NEGATIVE Final   Influenza B by PCR NEGATIVE NEGATIVE Final    Comment: (NOTE) The Xpert Xpress SARS-CoV-2/FLU/RSV plus assay is intended as an aid in the diagnosis of influenza from Nasopharyngeal swab specimens and should not be used as a sole basis for treatment. Nasal washings and aspirates are unacceptable for Xpert Xpress SARS-CoV-2/FLU/RSV testing.  Fact  Sheet for Patients: BloggerCourse.com  Fact Sheet for Healthcare Providers: SeriousBroker.it  This test is not yet approved or cleared by the [place]  FDA and has been authorized for detection and/or diagnosis of SARS-CoV-2 by FDA under an Emergency Use Authorization (EUA). This EUA will remain in effect (meaning this test can be used) for the duration of the COVID-19 declaration under Section 564(b)(1) of the Act, 21 U.S.C. section 360bbb-3(b)(1), unless the authorization is terminated or revoked.     Resp Syncytial Virus by PCR NEGATIVE NEGATIVE Final    Comment: (NOTE) Fact Sheet for Patients: BloggerCourse.com  Fact Sheet for Healthcare  Providers: SeriousBroker.it  This test is not yet approved or cleared by the [place]  FDA and has been authorized for detection and/or diagnosis of SARS-CoV-2 by FDA under an Emergency Use Authorization (EUA). This EUA will remain in effect (meaning this test can be used) for the duration of the COVID-19 declaration under Section 564(b)(1) of the Act, 21 U.S.C. section 360bbb-3(b)(1), unless the authorization is terminated or revoked.  Performed at [facility], [address] [place], [place] [address]   Culture, blood (Routine X 2) w Reflex to ID Panel     Status: None (Preliminary result)   Collection Time: 01/27/24 10:39 AM   Specimen: BLOOD  Result Value Ref Range Status   Specimen Description BLOOD BLOOD RIGHT ARM  Final   Special Requests   Final    BOTTLES DRAWN AEROBIC AND ANAEROBIC Blood Culture adequate volume   Culture   Final    NO GROWTH 2 DAYS Performed at [facility] [address], [place], [place] [address]    Report Status PENDING  Incomplete  Culture, blood (Routine X 2) w Reflex to ID Panel     Status: None (Preliminary result)   Collection Time: 01/27/24 10:45 AM   Specimen: BLOOD  Result Value Ref Range Status   Specimen Description BLOOD BLOOD LEFT ARM  Final   Special Requests   Final    BOTTLES DRAWN AEROBIC ONLY Blood Culture results may not be optimal due to an inadequate volume of blood received in culture bottles   Culture   Final    NO GROWTH 2 DAYS Performed at [facility], [address], [place], [place] [address]    Report Status PENDING  Incomplete     Scheduled Meds:  budesonide -glycopyrrolate -formoterol   2 puff Inhalation BID   doxycycline   100 mg Oral Q12H   enoxaparin  (LOVENOX ) injection  40 mg Subcutaneous Q24H   fluticasone   2 spray Each Nare Daily   guaiFENesin   1,200 mg Oral BID   ipratropium-albuterol   3 mL Nebulization TID   loratadine   10 mg Oral Daily   methylPREDNISolone  (SOLU-MEDROL )  injection  60 mg Intravenous BID   nicotine   14 mg Transdermal Daily   rosuvastatin   5 mg Oral Daily   Continuous Infusions:  cefTRIAXone  (ROCEPHIN )  IV 2 g (01/29/24 0913)    Procedures/Studies: Portable chest 1 View Result Date: 01/28/2024 CLINICAL DATA:  Pleural effusion. EXAM: PORTABLE CHEST 1 VIEW COMPARISON:  01/27/2024 FINDINGS: Right base collapse/consolidation with effusion is similar to prior. Left lung is clear. Interstitial markings are diffusely coarsened with chronic features. Cardiopericardial silhouette is at upper limits of normal for size. No acute bony abnormality. IMPRESSION: Right base collapse/consolidation with effusion, similar to prior. Electronically Signed   By: Camellia Candle M.D.   On: 01/28/2024 06:37   ECHOCARDIOGRAM COMPLETE Result Date: 01/27/2024    ECHOCARDIOGRAM REPORT   Patient Name:  [facility] Victor Shields Date of Exam: 01/27/2024 Medical Rec #:  [id number]           Height:       68.0 in Accession #:    [id number]          Weight:       135.4 lb Date of Birth:  [birth year]           BSA:          1.731 m Patient Age:    73 years            BP:           159/72 mmHg Patient Gender: M                   HR:           110 bpm. Exam Location:  [facility] Procedure: 2D Echo, Cardiac Doppler and Color Doppler (Both Spectral and Color            Flow Doppler were utilized during procedure). Indications:    dyspnea  History:        Patient has no prior history of Echocardiogram examinations.                 COPD; Risk Factors:Current Smoker.  Sonographer:    Benard Stallion Referring Phys: [phone] CLANFORD L JOHNSON IMPRESSIONS  1. Left ventricular ejection fraction, by estimation, is 60 to 65%. The left ventricle has normal function. The left ventricle has no regional wall motion abnormalities. Left ventricular diastolic function could not be evaluated.  2. Right ventricular systolic function is normal. The right ventricular size is normal. There is moderately elevated pulmonary  artery systolic pressure.  3. The mitral valve is normal in structure. Trivial mitral valve regurgitation. No evidence of mitral stenosis.  4. The aortic valve is normal in structure. Aortic valve regurgitation is not visualized. No aortic stenosis is present.  5. The inferior vena cava is normal in size with greater than 50% respiratory variability, suggesting right atrial pressure of 3 mmHg. FINDINGS  Left Ventricle: Left ventricular ejection fraction, by estimation, is 60 to 65%. The left ventricle has normal function. The left ventricle has no regional wall motion abnormalities. The left ventricular internal cavity size was normal in size. There is  no left ventricular hypertrophy. Left ventricular diastolic function could not be evaluated. Right Ventricle: The right ventricular size is normal. No increase in right ventricular wall thickness. Right ventricular systolic function is normal. There is moderately elevated pulmonary artery systolic pressure. The tricuspid regurgitant velocity is 3.30 m/s, and with an assumed right atrial pressure of 3 mmHg, the estimated right ventricular systolic pressure is 46.6 mmHg. Left Atrium: Left atrial size was normal in size. Right Atrium: Right atrial size was normal in size. Pericardium: There is no evidence of pericardial effusion. Mitral Valve: The mitral valve is normal in structure. Trivial mitral valve regurgitation. No evidence of mitral valve stenosis. Tricuspid Valve: The tricuspid valve is normal in structure. Tricuspid valve regurgitation is mild . No evidence of tricuspid stenosis. Aortic Valve: The aortic valve is normal in structure. Aortic valve regurgitation is not visualized. No aortic stenosis is present. Aortic valve mean gradient measures 3.0 mmHg. Aortic valve peak gradient measures 6.5 mmHg. Aortic valve area, by VTI measures 3.31 cm. Pulmonic Valve: The pulmonic valve was normal in structure. Pulmonic valve regurgitation is not visualized. No  evidence of pulmonic stenosis. Aorta: The aortic root is normal in  size and structure. Venous: The inferior vena cava is normal in size with greater than 50% respiratory variability, suggesting right atrial pressure of 3 mmHg. IAS/Shunts: No atrial level shunt detected by color flow Doppler.  LEFT VENTRICLE PLAX 2D LVIDd:         4.95 cm   Diastology LVIDs:         3.70 cm   LV e' medial:  7.51 cm/s LV PW:         0.85 cm   LV e' lateral: 9.25 cm/s LV IVS:        0.85 cm LVOT diam:     2.20 cm LV SV:         65 LV SV Index:   37 LVOT Area:     3.80 cm  RIGHT VENTRICLE RV Basal diam:  3.25 cm RV Mid diam:    3.15 cm RV S prime:     18.60 cm/s TAPSE (M-mode): 2.4 cm LEFT ATRIUM           Index        RIGHT ATRIUM           Index LA Vol (A4C): 67.0 ml 38.70 ml/m  RA Area:     12.30 cm                                    RA Volume:   32.30 ml  18.66 ml/m  AORTIC VALVE AV Area (Vmax):    3.35 cm AV Area (Vmean):   3.30 cm AV Area (VTI):     3.31 cm AV Vmax:           127.00 cm/s AV Vmean:          83.400 cm/s AV VTI:            0.195 m AV Peak Grad:      6.5 mmHg AV Mean Grad:      3.0 mmHg LVOT Vmax:         112.00 cm/s LVOT Vmean:        72.300 cm/s LVOT VTI:          0.170 m LVOT/AV VTI ratio: 0.87  AORTA Ao Root diam: 3.20 cm TRICUSPID VALVE TR Peak grad:   43.6 mmHg TR Vmax:        330.00 cm/s  SHUNTS Systemic VTI:  0.17 m Systemic Diam: 2.20 cm Wilbert Bihari MD Electronically signed by Wilbert Bihari MD Signature Date/Time: 01/27/2024/6:41:20 PM    Final    US  THORACENTESIS ASP PLEURAL SPACE W/IMG GUIDE Result Date: 01/27/2024 INDICATION: Patient with history of COPD admitted with dyspnea and found to have large right pleural effusion. Request for diagnostic and therapeutic right thoracentesis. EXAM: ULTRASOUND GUIDED RIGHT THORACENTESIS MEDICATIONS: 6 mL 1% lidocaine  COMPLICATIONS: None immediate. PROCEDURE: An ultrasound guided thoracentesis was thoroughly discussed with the patient and questions answered.  The benefits, risks, alternatives and complications were also discussed. The patient understands and wishes to proceed with the procedure. Written consent was obtained. Ultrasound was performed to localize and mark an adequate pocket of fluid in the right chest. The area was then prepped and draped in the normal sterile fashion. 1% Lidocaine  was used for local anesthesia. Under ultrasound guidance a 6 Fr Safe-T-Centesis catheter was introduced. Thoracentesis was performed. The catheter was removed and a dressing applied. FINDINGS: A total of approximately 1.5 L of clear yellow fluid was removed.  Samples were sent to the laboratory as requested by the clinical team. IMPRESSION: Successful ultrasound guided right thoracentesis yielding 1.5 L of pleural fluid. Performed by Clotilda Hesselbach, PA-C Electronically Signed   By: Ester Sides M.D.   On: 01/27/2024 12:55   DG Chest 1 View Result Date: 01/27/2024 CLINICAL DATA:  Post thoracentesis. EXAM: CHEST  1 VIEW COMPARISON:  Radiographs 01/27/2024 and 02/15/2020. FINDINGS: 1129 hours. Interval decreased right pleural effusion with improved aeration of the right lung base. No definite pneumothorax. The heart size and mediastinal contours are stable with mild mediastinal shift to the right. The left lung appears clear. The bones appear unremarkable. IMPRESSION: Interval decreased right pleural effusion with improved aeration of the right lung base but incomplete re-expansion of the right lung. No definite pneumothorax. Electronically Signed   By: Elsie Perone M.D.   On: 01/27/2024 11:59   DG Chest Port 1 View Result Date: 01/27/2024 CLINICAL DATA:  Shortness of breath, wheezing, low O2 sats. EXAM: PORTABLE CHEST 1 VIEW COMPARISON:  02/15/2020. FINDINGS: Trachea is midline. Heart size stable. Large right pleural effusion with right basilar collapse/consolidation. Streaky atelectasis in the left lung base. IMPRESSION: Large right pleural effusion with  collapse/consolidation in the right lung base, possibly due to pneumonia. Difficult exclude underlying malignancy. Followup PA and lateral chest X-ray is recommended in 3-4 weeks following trial of antibiotic therapy to ensure resolution and exclude underlying malignancy. Electronically Signed   By: Newell Eke M.D.   On: 01/27/2024 09:25    Alm Schneider, DO  [facility]  If 7PM-7AM, please contact night-coverage [email or web address] Password [facility] 01/29/2024, 5:23 PM   LOS: 2 days

## 2024-01-29 NOTE — Plan of Care (Signed)

## 2024-01-29 NOTE — Plan of Care (Signed)
  Problem: Acute Rehab PT Goals(only PT should resolve) Goal: Pt Will Go Supine/Side To Sit Outcome: Progressing Flowsheets (Taken 01/29/2024 1416) Pt will go Supine/Side to Sit: with modified independence Goal: Patient Will Transfer Sit To/From Stand Outcome: Progressing Flowsheets (Taken 01/29/2024 1416) Patient will transfer sit to/from stand: with modified independence Goal: Pt Will Transfer Bed To Chair/Chair To Bed Outcome: Progressing Flowsheets (Taken 01/29/2024 1416) Pt will Transfer Bed to Chair/Chair to Bed:  with supervision  with modified independence Goal: Pt Will Ambulate Outcome: Progressing Flowsheets (Taken 01/29/2024 1416) Pt will Ambulate:  75 feet  with supervision  with rolling walker   2:17 PM, 01/29/24 Lynwood Music, MPT Physical Therapist with [facility] 336 [phone] office [phone] mobile phone

## 2024-01-29 NOTE — Progress Notes (Signed)
 Responded to nursing call:  afib RVR      Vitals:   01/29/24 0722 01/29/24 1300 01/29/24 1349 01/29/24 1829  BP:  (!) 118/44  132/71  Pulse:  88  (!) 154  Resp:  18  18  Temp:  (!) 97.4 F (36.3 C)  97.8 F (36.6 C)  TempSrc:  Oral    SpO2: 100% 100% 100%   Weight:      Height:        Assessment/Plan: Afib RVR -HR 130-150s -obtain EKG -transfer to SDU -start diltiazem  IV -pt has been refusing IV lopressor  -01/27/24 Echo EF 60-65%, no WMA     Alm Schneider, DO [facility]

## 2024-01-29 NOTE — Evaluation (Signed)
 Physical Therapy Evaluation Patient Details Name: Victor Shields MRN: [id number] DOB: April 06, 1951 Today's Date: 01/29/2024  History of Present Illness  Victor Shields is a 73 year old male with hypertension, hyperlipidemia, COPD, ongoing tobacco use, presented to the emergency department with acute onset shortness of breath and peripheral edema.  EMS found him to be hypoxic at home with oxygen saturation in the mid 80s.  He was wheezing and coughing.  His baseline supplemental oxygen was increased to 3 L with some improvement in oxygen saturation to the mid 90s.  He was given 125 mg of Solu-Medrol  IV by EMS.  He says that he was recently started on Lasix  by his PCP and after starting it was having symptoms of dizziness.  Over the past 2 to 3 days he has had progressive shortness of breath wheezing coughing and weakness.  Patient reports shortness of breath with minimal exertion which is not his baseline.  He has some shortness of breath symptoms but no fever chills or chest pain.  His chest x-ray showed findings of a large right pleural effusion with compressive atelectasis of the right lung and possible pneumonia.  He had an elevated BNP.  White blood cell count 11.9.  Respiratory panel negative for influenza RSV and SARS coronavirus 2.  Hospital admission was requested for management.   Clinical Impression  Patient demonstrates fair/good return for sitting up at bedside with HOB flat, unsteady on feet requiring hand held assist and leaning on armrest during transfers without AD, safer using RW and tolerated ambulating in hallway, but limited mostly due to c/o SOB with SpO2 dropping from 95% to 80% while on room air. Patient tolerated sitting up in chair after therapy with SpO2 increasing to 95% after resting, his spouse present - RN notified. Patient will benefit from continued skilled physical therapy in hospital and recommended venue below to increase strength, balance, endurance for safe ADLs  and gait.          If plan is discharge home, recommend the following: A little help with walking and/or transfers;A little help with bathing/dressing/bathroom;Help with stairs or ramp for entrance;Assist for transportation;Assistance with cooking/housework   Can travel by private vehicle        Equipment Recommendations None recommended by PT  Recommendations for Other Services       Functional Status Assessment Patient has had a recent decline in their functional status and demonstrates the ability to make significant improvements in function in a reasonable and predictable amount of time.     Precautions / Restrictions Precautions Precautions: Fall Recall of Precautions/Restrictions: Intact Restrictions Weight Bearing Restrictions Per Provider Order: No      Mobility  Bed Mobility Overal bed mobility: Needs Assistance Bed Mobility: Supine to Sit     Supine to sit: Supervision     General bed mobility comments: slightly labored movement    Transfers Overall transfer level: Needs assistance Equipment used: 1 person hand held assist, None, Rolling walker (2 wheels) Transfers: Sit to/from Stand, Bed to chair/wheelchair/BSC Sit to Stand: Contact guard assist, Min assist   Step pivot transfers: Contact guard assist, Min assist       General transfer comment: unsteady on feet having to lean on armrest of chair for support during transfer, safer using RW    Ambulation/Gait Ambulation/Gait assistance: Supervision, Contact guard assist Gait Distance (Feet): 40 Feet Assistive device: Rolling walker (2 wheels) Gait Pattern/deviations: Decreased step length - left, Decreased stance time - right, Decreased stride length, Trunk flexed Gait  velocity: decreased     General Gait Details: slow labored movement without loss of balance, limited mostly due to fatigue and SpO2 dropping from 95% to 80% while on room air  Stairs            Wheelchair Mobility      Tilt Bed    Modified Rankin (Stroke Patients Only)       Balance Overall balance assessment: Needs assistance Sitting-balance support: Feet supported, No upper extremity supported Sitting balance-Leahy Scale: Fair Sitting balance - Comments: fair/good seated at EOB   Standing balance support: During functional activity, No upper extremity supported Standing balance-Leahy Scale: Poor Standing balance comment: fair/poor without AD, fair/good using RW                             Pertinent Vitals/Pain Pain Assessment Pain Assessment: No/denies pain    Home Living Family/patient expects to be discharged to:: Private residence Living Arrangements: Spouse/significant other Available Help at Discharge: Family;Available 24 hours/day Type of Home: House Home Access: Stairs to enter   [employer] of Steps: 2  with bilateral posts he can use   Home Layout: One level Home Equipment: Rollator (4 wheels);Shower seat;BSC/3in1      Prior Function Prior Level of Function : Independent/Modified Independent             Mobility Comments: household ambulation with SPC PRN ADLs Comments: Assisted by family     Extremity/Trunk Assessment   Upper Extremity Assessment Upper Extremity Assessment: Generalized weakness    Lower Extremity Assessment Lower Extremity Assessment: Generalized weakness    Cervical / Trunk Assessment Cervical / Trunk Assessment: Kyphotic  Communication   Communication Factors Affecting Communication: Hearing impaired    Cognition Arousal: Alert Behavior During Therapy: WFL for tasks assessed/performed   PT - Cognitive impairments: No apparent impairments                         Following commands: Intact       Cueing Cueing Techniques: Verbal cues, Tactile cues     General Comments      Exercises     Assessment/Plan    PT Assessment Patient needs continued PT services  PT Problem List Decreased  strength;Decreased activity tolerance;Decreased balance;Decreased mobility       PT Treatment Interventions DME instruction;Gait training;Stair training;Functional mobility training;Therapeutic activities;Therapeutic exercise;Balance training;Patient/family education    PT Goals (Current goals can be found in the Care Plan section)  Acute Rehab PT Goals Patient Stated Goal: return home with family to assist PT Goal Formulation: With patient Time For Goal Achievement: 02/03/24 Potential to Achieve Goals: Good    Frequency Min 3X/week     Co-evaluation               AM-PAC PT 6 Clicks Mobility  Outcome Measure Help needed turning from your back to your side while in a flat bed without using bedrails?: None Help needed moving from lying on your back to sitting on the side of a flat bed without using bedrails?: A Little Help needed moving to and from a bed to a chair (including a wheelchair)?: A Little Help needed standing up from a chair using your arms (e.g., wheelchair or bedside chair)?: A Little Help needed to walk in hospital room?: A Little Help needed climbing 3-5 steps with a railing? : A Lot 6 Click Score: 18    End of Session  Activity Tolerance: Patient tolerated treatment well;Patient limited by fatigue Patient left: in chair;with call bell/phone within reach;with family/visitor present Nurse Communication: Mobility status PT Visit Diagnosis: Unsteadiness on feet (R26.81);Other abnormalities of gait and mobility (R26.89);Muscle weakness (generalized) (M62.81)    Time: 8890-8868 PT Time Calculation (min) (ACUTE ONLY): 22 min   Charges:   PT Evaluation $PT Eval Moderate Complexity: 1 Mod PT Treatments $Therapeutic Activity: 8-22 mins PT General Charges $$ ACUTE PT VISIT: 1 Visit         2:15 PM, 01/29/24 Lynwood Music, MPT Physical Therapist with [facility] 336 [phone] office [phone] mobile phone

## 2024-01-30 ENCOUNTER — Inpatient Hospital Stay (HOSPITAL_COMMUNITY)

## 2024-01-30 DIAGNOSIS — I5031 Acute diastolic (congestive) heart failure: Secondary | ICD-10-CM | POA: Diagnosis not present

## 2024-01-30 DIAGNOSIS — I480 Paroxysmal atrial fibrillation: Secondary | ICD-10-CM

## 2024-01-30 DIAGNOSIS — J9601 Acute respiratory failure with hypoxia: Secondary | ICD-10-CM | POA: Diagnosis not present

## 2024-01-30 DIAGNOSIS — E785 Hyperlipidemia, unspecified: Secondary | ICD-10-CM

## 2024-01-30 DIAGNOSIS — J441 Chronic obstructive pulmonary disease with (acute) exacerbation: Secondary | ICD-10-CM | POA: Diagnosis not present

## 2024-01-30 DIAGNOSIS — R000 Tachycardia, unspecified: Secondary | ICD-10-CM | POA: Diagnosis not present

## 2024-01-30 DIAGNOSIS — J449 Chronic obstructive pulmonary disease, unspecified: Secondary | ICD-10-CM | POA: Diagnosis not present

## 2024-01-30 DIAGNOSIS — Z720 Tobacco use: Secondary | ICD-10-CM | POA: Diagnosis not present

## 2024-01-30 LAB — CBC WITH DIFFERENTIAL/PLATELET
Abs Immature Granulocytes: 0.11 K/uL — ABNORMAL HIGH (ref 0.00–0.07)
Basophils Absolute: 0 K/uL (ref 0.0–0.1)
Basophils Relative: 0 %
Eosinophils Absolute: 0 K/uL (ref 0.0–0.5)
Eosinophils Relative: 0 %
HCT: 46.7 % (ref 39.0–52.0)
Hemoglobin: 15.5 g/dL (ref 13.0–17.0)
Immature Granulocytes: 1 %
Lymphocytes Relative: 5 %
Lymphs Abs: 0.9 K/uL (ref 0.7–4.0)
MCH: 31.4 pg (ref 26.0–34.0)
MCHC: 33.2 g/dL (ref 30.0–36.0)
MCV: 94.5 fL (ref 80.0–100.0)
Monocytes Absolute: 0.8 K/uL (ref 0.1–1.0)
Monocytes Relative: 4 %
Neutro Abs: 15.8 K/uL — ABNORMAL HIGH (ref 1.7–7.7)
Neutrophils Relative %: 90 %
Platelets: 334 K/uL (ref 150–400)
RBC: 4.94 MIL/uL (ref 4.22–5.81)
RDW: 14.1 % (ref 11.5–15.5)
WBC: 17.5 K/uL — ABNORMAL HIGH (ref 4.0–10.5)
nRBC: 0 % (ref 0.0–0.2)

## 2024-01-30 LAB — BASIC METABOLIC PANEL WITH GFR
Anion gap: 9 (ref 5–15)
BUN: 23 mg/dL (ref 8–23)
CO2: 25 mmol/L (ref 22–32)
Calcium: 8.9 mg/dL (ref 8.9–10.3)
Chloride: 105 mmol/L (ref 98–111)
Creatinine, Ser: 0.6 mg/dL — ABNORMAL LOW (ref 0.61–1.24)
GFR, Estimated: >60 mL/min (ref >60)
Glucose, Bld: 151 mg/dL — ABNORMAL HIGH (ref 70–99)
Potassium: 3.9 mmol/L (ref 3.5–5.1)
Sodium: 139 mmol/L (ref 135–145)

## 2024-01-30 LAB — MRSA NEXT GEN BY PCR, NASAL: MRSA by PCR Next Gen: NOT DETECTED

## 2024-01-30 LAB — MAGNESIUM: Magnesium: 2 mg/dL (ref 1.7–2.4)

## 2024-01-30 MED ORDER — FUROSEMIDE 10 MG/ML IJ SOLN
40.0000 mg | Freq: Once | INTRAMUSCULAR | Status: AC
  Administered 2024-01-30: 40 mg via INTRAVENOUS
  Filled 2024-01-30: qty 4

## 2024-01-30 MED ORDER — DIGOXIN 0.25 MG/ML IJ SOLN
0.2500 mg | Freq: Four times a day (QID) | INTRAMUSCULAR | Status: AC
  Administered 2024-01-30 (×3): 0.25 mg via INTRAVENOUS
  Filled 2024-01-30 (×3): qty 2

## 2024-01-30 MED ORDER — BISACODYL 10 MG RE SUPP
10.0000 mg | Freq: Once | RECTAL | Status: AC
  Administered 2024-01-30: 10 mg via RECTAL
  Filled 2024-01-30: qty 1

## 2024-01-30 MED ORDER — CLOTRIMAZOLE 10 MG MT TROC
10.0000 mg | Freq: Every day | OROMUCOSAL | Status: DC
  Administered 2024-01-30 – 2024-02-04 (×23): 10 mg via ORAL
  Filled 2024-01-30 (×31): qty 1

## 2024-01-30 MED ORDER — APIXABAN 5 MG PO TABS
5.0000 mg | ORAL_TABLET | Freq: Two times a day (BID) | ORAL | Status: DC
  Administered 2024-01-30 – 2024-02-04 (×11): 5 mg via ORAL
  Filled 2024-01-30 (×12): qty 1

## 2024-01-30 MED ORDER — AMIODARONE HCL IN DEXTROSE 360-4.14 MG/200ML-% IV SOLN
60.0000 mg/h | INTRAVENOUS | Status: AC
  Administered 2024-01-30 (×2): 60 mg/h via INTRAVENOUS
  Filled 2024-01-30 (×2): qty 200

## 2024-01-30 MED ORDER — GUAIFENESIN 200 MG PO TABS
400.0000 mg | ORAL_TABLET | Freq: Four times a day (QID) | ORAL | Status: DC
  Filled 2024-01-30 (×5): qty 2

## 2024-01-30 MED ORDER — GUAIFENESIN 100 MG/5ML PO LIQD
15.0000 mL | Freq: Four times a day (QID) | ORAL | Status: DC
  Administered 2024-01-30 – 2024-02-04 (×20): 15 mL via ORAL
  Filled 2024-01-30 (×22): qty 15

## 2024-01-30 MED ORDER — POLYETHYLENE GLYCOL 3350 17 G PO PACK
17.0000 g | PACK | Freq: Every day | ORAL | Status: DC
  Administered 2024-01-30 – 2024-02-04 (×5): 17 g via ORAL
  Filled 2024-01-30 (×6): qty 1

## 2024-01-30 MED ORDER — AMIODARONE LOAD VIA INFUSION
150.0000 mg | Freq: Once | INTRAVENOUS | Status: AC
Start: 2024-01-30 — End: 2024-01-30
  Administered 2024-01-30: 150 mg via INTRAVENOUS
  Filled 2024-01-30: qty 83.34

## 2024-01-30 MED ORDER — MELATONIN 3 MG PO TABS
3.0000 mg | ORAL_TABLET | Freq: Once | ORAL | Status: AC
  Administered 2024-01-30: 3 mg via ORAL
  Filled 2024-01-30: qty 1

## 2024-01-30 MED ORDER — AMIODARONE HCL IN DEXTROSE 360-4.14 MG/200ML-% IV SOLN
30.0000 mg/h | INTRAVENOUS | Status: DC
  Administered 2024-01-30 – 2024-02-03 (×6): 30 mg/h via INTRAVENOUS
  Filled 2024-01-30 (×6): qty 200

## 2024-01-30 NOTE — Plan of Care (Signed)

## 2024-01-30 NOTE — Progress Notes (Signed)
 PROGRESS NOTE  Victor Shields FMW:[id number] DOB: 02-02-1951 DOA: 01/27/2024 PCP: Lavell Bari LABOR, FNP  Brief History:  73 year old male with hypertension, hyperlipidemia, COPD, ongoing tobacco use, presented to the emergency department with acute onset shortness of breath and peripheral edema.  EMS found him to be hypoxic at home with oxygen saturation in the mid 80s.  He was wheezing and coughing.  His baseline supplemental oxygen was increased to 3 L with some improvement in oxygen saturation to the mid 90s.  He was given 125 mg of Solu-Medrol  IV by EMS.  He says that he was recently started on Lasix  by his PCP and after starting it was having symptoms of dizziness.  Over the past 2 to 3 days he has had progressive shortness of breath wheezing coughing and weakness.  Patient reports shortness of breath with minimal exertion which is not his baseline.  He has some shortness of breath symptoms but no fever chills or chest pain.  His chest x-ray showed findings of a large right pleural effusion with compressive atelectasis of the right lung and possible pneumonia.  He had an elevated BNP.  White blood cell count 11.9.  Respiratory panel negative for influenza RSV and SARS coronavirus 2.  Hospital admission was requested for management. Patient was started on IV Solu-Medrol  and bronchodilators with gradual improvement.  He was weaned off oxygen.  He underwent thoracocentesis removing 1.5 L.  He was given a dose of IV furosemide  for his fluid overload.  Unfortunately, the patient developed atrial fibrillation with RVR on the evening of 01/29/2024.  He was placed on diltiazem  drip.  His rates remained elevated in the 140s.  Cardiology was consulted to assist with management.   Assessment/Plan: Acute respiratory failure with hypoxia -- Secondary to large right pleural effusion -- Secondary to acute COPD exacerbation and CHF -- thoracentesis completed 7/21 and 1.5 L serous fluid removed, fluid  sent for testing  -- added incentive spirometry 7/22 - initially on 2L>>RA   Acute COPD exacerbation -- continue IV solumedrol -continue duonebs -continue pulmicort  -continue brovana  -- added flutter valve 7/22    Acute HFpEF -7/21 Echo EF 60-65%, no wMA, normal RVF, mod elevated PASP -s/p thora 7/21>>1.5 L removed>>transudative -lasix  IV 40 x 1 on 7/23 -check ReDS -daily weights -I/Os incomplete  Atrial fibrillation with RVR -started on diltiazem  drip 7/23 -HRs remain in 140s  -will need to tolerate mildly elevated HRs in setting of acute medical illness -cardiology consult -Echo -TSH -CHADSVASc = 3--start apixaban    Tobacco -- Nicotine  patch ordered for cravings --cessation discussed   Mixed Hyperlipidemia -continue statin         Family Communication:   spouse at bedside 7/24   Consultants:  none   Code Status:  FULL    DVT Prophylaxis:  apixaban      Procedures: As Listed in Progress Note Above   Antibiotics: Ceftriaxone  7/21>> Doxy 7/21>>            Subjective: Patient denies fevers, chills, headache, chest pain, dyspnea, nausea, vomiting, diarrhea, abdominal pain, dysuria, hematuria, hematochezia, and melena.   Objective: Vitals:   01/30/24 0700 01/30/24 0730 01/30/24 0802 01/30/24 0830  BP: 106/81 134/69  (!) 107/52  Pulse: (!) 142 97 92 (!) 140  Resp:  18 10 20   Temp: 98.2 F (36.8 C)     TempSrc: Oral     SpO2: 99% 96% 98% 96%  Weight:  Height:        Intake/Output Summary (Last 24 hours) at 01/30/2024 0917 Last data filed at 01/30/2024 0825 Gross per 24 hour  Intake 969.7 ml  Output 1185 ml  Net -215.3 ml   Weight change:  Exam:  General:  Pt is alert, follows commands appropriately, not in acute distress HEENT: No icterus, No thrush, No neck mass, [place]/AT Cardiovascular: IRRR, S1/S2, no rubs, no gallops Respiratory: diminished BS.  Bibasilar rales.  Mild bibasilar wheeze Abdomen: Soft/+BS, non tender, non  distended, no guarding Extremities: trace LE edema, No lymphangitis, No petechiae, No rashes, no synovitis   Data Reviewed: I have personally reviewed following labs and imaging studies Basic Metabolic Panel: Recent Labs  Lab 01/27/24 0858 01/28/24 0449 01/29/24 0444 01/30/24 0427  NA 141 140 139 139  K 4.2 3.8 4.4 3.9  CL 104 108 110 105  CO2 25 25 21* 25  GLUCOSE 105* 133* 113* 151*  BUN 10 13 18 23   CREATININE 0.55* 0.49* 0.47* 0.60*  CALCIUM  8.6* 8.3* 8.1* 8.9  MG 1.7 1.8 2.3 2.0   Liver Function Tests: No results for input(s): AST, ALT, ALKPHOS, BILITOT, PROT, ALBUMIN in the last 168 hours. No results for input(s): LIPASE, AMYLASE in the last 168 hours. No results for input(s): AMMONIA in the last 168 hours. Coagulation Profile: No results for input(s): INR, PROTIME in the last 168 hours. CBC: Recent Labs  Lab 01/27/24 0858 01/28/24 0449 01/29/24 0444 01/30/24 0427  WBC 11.9* 9.5 19.3* 17.5*  NEUTROABS 10.2* 8.5* 17.3* 15.8*  HGB 14.7 13.3 12.3* 15.5  HCT 45.4 40.8 36.5* 46.7  MCV 97.0 97.1 93.6 94.5  PLT 275 249 PLATELET CLUMPS NOTED ON SMEAR, UNABLE TO ESTIMATE 334   Cardiac Enzymes: No results for input(s): CKTOTAL, CKMB, CKMBINDEX, TROPONINI in the last 168 hours. BNP: Invalid input(s): POCBNP CBG: No results for input(s): GLUCAP in the last 168 hours. HbA1C: No results for input(s): HGBA1C in the last 72 hours. Urine analysis:    Component Value Date/Time   APPEARANCEUR Cloudy (A) 05/19/2021 1459   GLUCOSEU Negative 05/19/2021 1459   BILIRUBINUR Negative 05/19/2021 1459   PROTEINUR 1+ (A) 05/19/2021 1459   NITRITE Negative 05/19/2021 1459   LEUKOCYTESUR Negative 05/19/2021 1459   Sepsis Labs: @LABRCNTIP (procalcitonin:4,lacticidven:4) ) Recent Results (from the past 240 hours)  Resp panel by RT-PCR (RSV, Flu A&B, Covid) Anterior Nasal Swab     Status: None   Collection Time: 01/27/24  9:15 AM   Specimen:  Anterior Nasal Swab  Result Value Ref Range Status   SARS Coronavirus 2 by RT PCR NEGATIVE NEGATIVE Final    Comment: (NOTE) SARS-CoV-2 target nucleic acids are NOT DETECTED.  The SARS-CoV-2 RNA is generally detectable in upper respiratory specimens during the acute phase of infection. The lowest concentration of SARS-CoV-2 viral copies this assay can detect is 138 copies/mL. A negative result does not preclude SARS-Cov-2 infection and should not be used as the sole basis for treatment or other patient management decisions. A negative result may occur with  improper specimen collection/handling, submission of specimen other than nasopharyngeal swab, presence of viral mutation(s) within the areas targeted by this assay, and inadequate number of viral copies(<138 copies/mL). A negative result must be combined with clinical observations, patient history, and epidemiological information. The expected result is Negative.  Fact Sheet for Patients:  BloggerCourse.com  Fact Sheet for Healthcare Providers:  SeriousBroker.it  This test is no t yet approved or cleared by the [place]  FDA and  has been authorized for detection and/or diagnosis of SARS-CoV-2 by FDA under an Emergency Use Authorization (EUA). This EUA will remain  in effect (meaning this test can be used) for the duration of the COVID-19 declaration under Section 564(b)(1) of the Act, 21 U.S.C.section 360bbb-3(b)(1), unless the authorization is terminated  or revoked sooner.       Influenza A by PCR NEGATIVE NEGATIVE Final   Influenza B by PCR NEGATIVE NEGATIVE Final    Comment: (NOTE) The Xpert Xpress SARS-CoV-2/FLU/RSV plus assay is intended as an aid in the diagnosis of influenza from Nasopharyngeal swab specimens and should not be used as a sole basis for treatment. Nasal washings and aspirates are unacceptable for Xpert Xpress SARS-CoV-2/FLU/RSV testing.  Fact  Sheet for Patients: BloggerCourse.com  Fact Sheet for Healthcare Providers: SeriousBroker.it  This test is not yet approved or cleared by the [place]  FDA and has been authorized for detection and/or diagnosis of SARS-CoV-2 by FDA under an Emergency Use Authorization (EUA). This EUA will remain in effect (meaning this test can be used) for the duration of the COVID-19 declaration under Section 564(b)(1) of the Act, 21 U.S.C. section 360bbb-3(b)(1), unless the authorization is terminated or revoked.     Resp Syncytial Virus by PCR NEGATIVE NEGATIVE Final    Comment: (NOTE) Fact Sheet for Patients: BloggerCourse.com  Fact Sheet for Healthcare Providers: SeriousBroker.it  This test is not yet approved or cleared by the [place]  FDA and has been authorized for detection and/or diagnosis of SARS-CoV-2 by FDA under an Emergency Use Authorization (EUA). This EUA will remain in effect (meaning this test can be used) for the duration of the COVID-19 declaration under Section 564(b)(1) of the Act, 21 U.S.C. section 360bbb-3(b)(1), unless the authorization is terminated or revoked.  Performed at [facility], [address] [place], [place] [address]   Culture, blood (Routine X 2) w Reflex to ID Panel     Status: None (Preliminary result)   Collection Time: 01/27/24 10:39 AM   Specimen: BLOOD  Result Value Ref Range Status   Specimen Description BLOOD BLOOD RIGHT ARM  Final   Special Requests   Final    BOTTLES DRAWN AEROBIC AND ANAEROBIC Blood Culture adequate volume   Culture   Final    NO GROWTH 3 DAYS Performed at [facility], [address] [place], [place] [address]    Report Status PENDING  Incomplete  Culture, blood (Routine X 2) w Reflex to ID Panel     Status: None (Preliminary result)   Collection Time: 01/27/24 10:45 AM   Specimen: BLOOD  Result Value Ref Range  Status   Specimen Description BLOOD BLOOD LEFT ARM  Final   Special Requests   Final    BOTTLES DRAWN AEROBIC ONLY Blood Culture results may not be optimal due to an inadequate volume of blood received in culture bottles   Culture   Final    NO GROWTH 3 DAYS Performed at [facility], [address], [place], [place] [address]    Report Status PENDING  Incomplete  MRSA Next Gen by PCR, Nasal     Status: None   Collection Time: 01/29/24  7:00 PM   Specimen: Nasal Mucosa; Nasal Swab  Result Value Ref Range Status   MRSA by PCR Next Gen NOT DETECTED NOT DETECTED Final    Comment: (NOTE) The GeneXpert MRSA Assay (FDA approved for NASAL specimens only), is one component of a comprehensive MRSA colonization surveillance program. It is not intended to  diagnose MRSA infection nor to guide or monitor treatment for MRSA infections. Test performance is not FDA approved in patients less than 66 years old. Performed at [facility], [address], [place], [place] [address]      Scheduled Meds:  arformoterol   15 mcg Nebulization BID   budesonide  (PULMICORT ) nebulizer solution  0.5 mg Nebulization BID   Chlorhexidine  Gluconate Cloth  6 each Topical Q0600   doxycycline   100 mg Oral Q12H   enoxaparin  (LOVENOX ) injection  40 mg Subcutaneous Q24H   fluticasone   2 spray Each Nare Daily   guaiFENesin   1,200 mg Oral BID   ipratropium-albuterol   3 mL Nebulization TID   loratadine   10 mg Oral Daily   methylPREDNISolone  (SOLU-MEDROL ) injection  60 mg Intravenous BID   nicotine   14 mg Transdermal Daily   rosuvastatin   5 mg Oral Daily   Continuous Infusions:  cefTRIAXone  (ROCEPHIN )  IV Stopped (01/29/24 0949)   diltiazem  (CARDIZEM ) infusion 15 mg/hr (01/30/24 0143)    Procedures/Studies: Portable chest 1 View Result Date: 01/28/2024 CLINICAL DATA:  Pleural effusion. EXAM: PORTABLE CHEST 1 VIEW COMPARISON:  01/27/2024 FINDINGS: Right base collapse/consolidation with effusion is similar to prior.  Left lung is clear. Interstitial markings are diffusely coarsened with chronic features. Cardiopericardial silhouette is at upper limits of normal for size. No acute bony abnormality. IMPRESSION: Right base collapse/consolidation with effusion, similar to prior. Electronically Signed   By: Camellia Candle M.D.   On: 01/28/2024 06:37   ECHOCARDIOGRAM COMPLETE Result Date: 01/27/2024    ECHOCARDIOGRAM REPORT   Patient Name:   [facility] Victor Shields Date of Exam: 01/27/2024 Medical Rec #:  [id number]           Height:       68.0 in Accession #:    [id number]          Weight:       135.4 lb Date of Birth:  Sep 05, 1950           BSA:          1.731 m Patient Age:    73 years            BP:           159/72 mmHg Patient Gender: M                   HR:           110 bpm. Exam Location:  [facility] Procedure: 2D Echo, Cardiac Doppler and Color Doppler (Both Spectral and Color            Flow Doppler were utilized during procedure). Indications:    dyspnea  History:        Patient has no prior history of Echocardiogram examinations.                 COPD; Risk Factors:Current Smoker.  Sonographer:    Benard Stallion Referring Phys: [id number] CLANFORD L JOHNSON IMPRESSIONS  1. Left ventricular ejection fraction, by estimation, is 60 to 65%. The left ventricle has normal function. The left ventricle has no regional wall motion abnormalities. Left ventricular diastolic function could not be evaluated.  2. Right ventricular systolic function is normal. The right ventricular size is normal. There is moderately elevated pulmonary artery systolic pressure.  3. The mitral valve is normal in structure. Trivial mitral valve regurgitation. No evidence of mitral stenosis.  4. The aortic valve is normal in structure. Aortic valve regurgitation is not visualized. No aortic  stenosis is present.  5. The inferior vena cava is normal in size with greater than 50% respiratory variability, suggesting right atrial pressure of 3 mmHg. FINDINGS  Left  Ventricle: Left ventricular ejection fraction, by estimation, is 60 to 65%. The left ventricle has normal function. The left ventricle has no regional wall motion abnormalities. The left ventricular internal cavity size was normal in size. There is  no left ventricular hypertrophy. Left ventricular diastolic function could not be evaluated. Right Ventricle: The right ventricular size is normal. No increase in right ventricular wall thickness. Right ventricular systolic function is normal. There is moderately elevated pulmonary artery systolic pressure. The tricuspid regurgitant velocity is 3.30 m/s, and with an assumed right atrial pressure of 3 mmHg, the estimated right ventricular systolic pressure is 46.6 mmHg. Left Atrium: Left atrial size was normal in size. Right Atrium: Right atrial size was normal in size. Pericardium: There is no evidence of pericardial effusion. Mitral Valve: The mitral valve is normal in structure. Trivial mitral valve regurgitation. No evidence of mitral valve stenosis. Tricuspid Valve: The tricuspid valve is normal in structure. Tricuspid valve regurgitation is mild . No evidence of tricuspid stenosis. Aortic Valve: The aortic valve is normal in structure. Aortic valve regurgitation is not visualized. No aortic stenosis is present. Aortic valve mean gradient measures 3.0 mmHg. Aortic valve peak gradient measures 6.5 mmHg. Aortic valve area, by VTI measures 3.31 cm. Pulmonic Valve: The pulmonic valve was normal in structure. Pulmonic valve regurgitation is not visualized. No evidence of pulmonic stenosis. Aorta: The aortic root is normal in size and structure. Venous: The inferior vena cava is normal in size with greater than 50% respiratory variability, suggesting right atrial pressure of 3 mmHg. IAS/Shunts: No atrial level shunt detected by color flow Doppler.  LEFT VENTRICLE PLAX 2D LVIDd:         4.95 cm   Diastology LVIDs:         3.70 cm   LV e' medial:  7.51 cm/s LV PW:          0.85 cm   LV e' lateral: 9.25 cm/s LV IVS:        0.85 cm LVOT diam:     2.20 cm LV SV:         65 LV SV Index:   37 LVOT Area:     3.80 cm  RIGHT VENTRICLE RV Basal diam:  3.25 cm RV Mid diam:    3.15 cm RV S prime:     18.60 cm/s TAPSE (M-mode): 2.4 cm LEFT ATRIUM           Index        RIGHT ATRIUM           Index LA Vol (A4C): 67.0 ml 38.70 ml/m  RA Area:     12.30 cm                                    RA Volume:   32.30 ml  18.66 ml/m  AORTIC VALVE AV Area (Vmax):    3.35 cm AV Area (Vmean):   3.30 cm AV Area (VTI):     3.31 cm AV Vmax:           127.00 cm/s AV Vmean:          83.400 cm/s AV VTI:            0.195 m AV  Peak Grad:      6.5 mmHg AV Mean Grad:      3.0 mmHg LVOT Vmax:         112.00 cm/s LVOT Vmean:        72.300 cm/s LVOT VTI:          0.170 m LVOT/AV VTI ratio: 0.87  AORTA Ao Root diam: 3.20 cm TRICUSPID VALVE TR Peak grad:   43.6 mmHg TR Vmax:        330.00 cm/s  SHUNTS Systemic VTI:  0.17 m Systemic Diam: 2.20 cm Wilbert Bihari MD Electronically signed by Wilbert Bihari MD Signature Date/Time: 01/27/2024/6:41:20 PM    Final    US  THORACENTESIS ASP PLEURAL SPACE W/IMG GUIDE Result Date: 01/27/2024 INDICATION: Patient with history of COPD admitted with dyspnea and found to have large right pleural effusion. Request for diagnostic and therapeutic right thoracentesis. EXAM: ULTRASOUND GUIDED RIGHT THORACENTESIS MEDICATIONS: 6 mL 1% lidocaine  COMPLICATIONS: None immediate. PROCEDURE: An ultrasound guided thoracentesis was thoroughly discussed with the patient and questions answered. The benefits, risks, alternatives and complications were also discussed. The patient understands and wishes to proceed with the procedure. Written consent was obtained. Ultrasound was performed to localize and mark an adequate pocket of fluid in the right chest. The area was then prepped and draped in the normal sterile fashion. 1% Lidocaine  was used for local anesthesia. Under ultrasound guidance a 6 Fr  Safe-T-Centesis catheter was introduced. Thoracentesis was performed. The catheter was removed and a dressing applied. FINDINGS: A total of approximately 1.5 L of clear yellow fluid was removed. Samples were sent to the laboratory as requested by the clinical team. IMPRESSION: Successful ultrasound guided right thoracentesis yielding 1.5 L of pleural fluid. Performed by Clotilda Hesselbach, PA-C Electronically Signed   By: Ester Sides M.D.   On: 01/27/2024 12:55   DG Chest 1 View Result Date: 01/27/2024 CLINICAL DATA:  Post thoracentesis. EXAM: CHEST  1 VIEW COMPARISON:  Radiographs 01/27/2024 and 02/15/2020. FINDINGS: 1129 hours. Interval decreased right pleural effusion with improved aeration of the right lung base. No definite pneumothorax. The heart size and mediastinal contours are stable with mild mediastinal shift to the right. The left lung appears clear. The bones appear unremarkable. IMPRESSION: Interval decreased right pleural effusion with improved aeration of the right lung base but incomplete re-expansion of the right lung. No definite pneumothorax. Electronically Signed   By: Elsie Perone M.D.   On: 01/27/2024 11:59   DG Chest Port 1 View Result Date: 01/27/2024 CLINICAL DATA:  Shortness of breath, wheezing, low O2 sats. EXAM: PORTABLE CHEST 1 VIEW COMPARISON:  02/15/2020. FINDINGS: Trachea is midline. Heart size stable. Large right pleural effusion with right basilar collapse/consolidation. Streaky atelectasis in the left lung base. IMPRESSION: Large right pleural effusion with collapse/consolidation in the right lung base, possibly due to pneumonia. Difficult exclude underlying malignancy. Followup PA and lateral chest X-ray is recommended in 3-4 weeks following trial of antibiotic therapy to ensure resolution and exclude underlying malignancy. Electronically Signed   By: Newell Eke M.D.   On: 01/27/2024 09:25    Alm Schneider, DO  [facility]  If 7PM-7AM, please contact  night-coverage [email or web address] Password [facility] 01/30/2024, 9:17 AM   LOS: 3 days

## 2024-01-30 NOTE — Evaluation (Signed)
 Clinical/Bedside Swallow Evaluation Patient Details  Name: Victor Shields MRN: [id number] Date of Birth: 08-18-1950  Today's Date: 01/30/2024 Time: SLP Start Time (ACUTE ONLY): 1355 SLP Stop Time (ACUTE ONLY): 1422 SLP Time Calculation (min) (ACUTE ONLY): 27 min  Past Medical History:  Past Medical History:  Diagnosis Date   COPD (chronic obstructive pulmonary disease) (HCC)    Gout    Hyperlipidemia    Hypertension    Past Surgical History:  Past Surgical History:  Procedure Laterality Date   CATARACT EXTRACTION     COMPRESSION HIP SCREW Left 02/17/2020   Procedure: OPEN TREATMENT INTERNAL FIXATION LEFT HIP;  Surgeon: Margrette Taft BRAVO, MD;  Location: [facility];  Service: Orthopedics;  Laterality: Left;   HPI:  73 year old male with hypertension, hyperlipidemia, COPD, ongoing tobacco use, presented to the emergency department with acute onset shortness of breath and peripheral edema.  EMS found him to be hypoxic at home with oxygen saturation in the mid 80s.  He was wheezing and coughing.  His baseline supplemental oxygen was increased to 3 L with some improvement in oxygen saturation to the mid 90s.  He was given 125 mg of Solu-Medrol  IV by EMS.  He says that he was recently started on Lasix  by his PCP and after starting it was having symptoms of dizziness.  Over the past 2 to 3 days he has had progressive shortness of breath wheezing coughing and weakness.  Patient reports shortness of breath with minimal exertion which is not his baseline.  He has some shortness of breath symptoms but no fever chills or chest pain.  His chest x-ray showed findings of a large right pleural effusion with compressive atelectasis of the right lung and possible pneumonia.  He had an elevated BNP.  White blood cell count 11.9.  Respiratory panel negative for influenza RSV and SARS coronavirus 2.  Hospital admission was requested for management. Pt with  thoracentesis completed 7/21 and 1.5 L serous fluid  removed, fluid sent for testing. BSE requested 01/29/24 due to reports of Pt difficulty swallowing a pill.    Assessment / Plan / Recommendation  Clinical Impression  Clinical swallow evaluation completed at bedside with wife present. Pt's oral cavity notable for redness and white patchy material along lingual surface and soft palate. OME otherwise WNL. Pt reports that he typically does not have difficulty swallowing pills at home (Aleve), but did have some trouble last night and earlier today. Nursing has since changed pills to liquid or crushed as able. Pt consumed ice chips, water  via cup/straw, puree, and regular textures with only one cough after the first straw sip of water  and no further signs of aspiration. Suspect that oral thrush is impacting swallow at this time. Recommend regular textures and thin liquids and PO medication crushed as able  or liquid form for guaifenesin  and SLP to check back tomorrow to allow benefit of medication treating thrush. Pt and RN in agreement with plan of care.  SLP Visit Diagnosis: Dysphagia, unspecified (R13.10)    Aspiration Risk  Mild aspiration risk    Diet Recommendation Regular;Thin liquid    Liquid Administration via: Cup;Straw Medication Administration: Crushed with puree Supervision: Patient able to self feed;Intermittent supervision to cue for compensatory strategies Postural Changes: Seated upright at 90 degrees;Remain upright for at least 30 minutes after po intake    Other  Recommendations Oral Care Recommendations: Oral care BID     Assistance Recommended at Discharge    Functional Status Assessment Patient has had  a recent decline in their functional status and demonstrates the ability to make significant improvements in function in a reasonable and predictable amount of time.  Frequency and Duration min 2x/week  1 week       Prognosis Prognosis for improved oropharyngeal function: Good      Swallow Study   General Date of Onset:  01/30/24 HPI: 73 year old male with hypertension, hyperlipidemia, COPD, ongoing tobacco use, presented to the emergency department with acute onset shortness of breath and peripheral edema.  EMS found him to be hypoxic at home with oxygen saturation in the mid 80s.  He was wheezing and coughing.  His baseline supplemental oxygen was increased to 3 L with some improvement in oxygen saturation to the mid 90s.  He was given 125 mg of Solu-Medrol  IV by EMS.  He says that he was recently started on Lasix  by his PCP and after starting it was having symptoms of dizziness.  Over the past 2 to 3 days he has had progressive shortness of breath wheezing coughing and weakness.  Patient reports shortness of breath with minimal exertion which is not his baseline.  He has some shortness of breath symptoms but no fever chills or chest pain.  His chest x-ray showed findings of a large right pleural effusion with compressive atelectasis of the right lung and possible pneumonia.  He had an elevated BNP.  White blood cell count 11.9.  Respiratory panel negative for influenza RSV and SARS coronavirus 2.  Hospital admission was requested for management. Pt with  thoracentesis completed 7/21 and 1.5 L serous fluid removed, fluid sent for testing. BSE requested 01/29/24 due to reports of Pt difficulty swallowing a pill. Type of Study: Bedside Swallow Evaluation Previous Swallow Assessment: N/A Diet Prior to this Study: Regular;Thin liquids (Level 0) Temperature Spikes Noted: No Respiratory Status: Room air History of Recent Intubation: No Behavior/Cognition: Alert;Cooperative;Pleasant mood Oral Cavity Assessment: Other (comment) (Pt appears to have thrush, white patchy on lingual surface and soft palate) Oral Care Completed by SLP: Recent completion by staff Oral Cavity - Dentition: Dentures, top;Edentulous Vision: Functional for self-feeding Self-Feeding Abilities: Able to feed self Patient Positioning: Upright in  bed Baseline Vocal Quality: Normal Volitional Cough: Strong Volitional Swallow: Able to elicit    Oral/Motor/Sensory Function Overall Oral Motor/Sensory Function: Within functional limits   Ice Chips Ice chips: Within functional limits Presentation: Spoon   Thin Liquid Thin Liquid: Impaired Presentation: Cup;Self Fed;Straw Pharyngeal  Phase Impairments: Cough - Immediate (x1 on the first sip from the straw)    Nectar Thick Nectar Thick Liquid: Not tested   Honey Thick Honey Thick Liquid: Not tested   Puree Puree: Within functional limits Presentation: Spoon;Self Fed   Solid     Solid: Within functional limits Presentation: Self Fed     Thank you,  Lamar Candy, CCC-SLP [phone]  Hettie Roselli 01/30/2024,2:36 PM

## 2024-01-30 NOTE — Plan of Care (Signed)

## 2024-01-30 NOTE — Consult Note (Signed)
 Cardiology Consultation   Patient ID: Victor Shields MRN: [id number]; DOB: Apr 15, 1951  Admit date: 01/27/2024 Date of Consult: 01/30/2024  PCP:  Lavell Bari LABOR, FNP   [facility] Providers Cardiologist:  [facility]   Patient Profile: Victor Shields is a 73 y.o. male with a hx of HTN, HLD and COPD who is being seen 01/30/2024 for the evaluation of atrial fibrillation with RVR at the request of Dr. Evonnie.  History of Present Illness: Victor Shields presented to [facility] on 01/27/2024 for evaluation of worsening shortness of breath. Oxygen saturations were initially in the 80's but improved into the 90's with 3 L nasal cannula. Initial labs showed WBC 11.9, Hgb 14.7, platelets 275, Na+ 141, K+ 4.2 and creatinine 0.55. BNP elevated at 376. Hs Trop negative at 7. Negative for COVID, influenza and RSV. CXR showed a large right pleural effusion and he underwent thoracentesis with 1.5 L of pleural fluid removed. Initial EKG showed a computer-generated read of atrial fibrillation but appears most consistent with sinus tachycardia with heart rate of 106 bpm.  He was admitted for further management of acute hypoxic respiratory failure in the setting of a COPD exacerbation and his pleural effusion. Was started on IV steroids along with scheduled nebulizers. Echocardiogram was obtained on admission and showed a preserved EF of 60 to 65% with no regional wall motion abnormalities. RV function was normal and he did have moderately elevated PASP at 46.6 mmHg. Was noted to have trivial MR but no significant valve abnormalities. Yesterday evening, he was noted to have gone into atrial fibrillation with RVR with heart rate in the 130's to 150's. He was transferred to the ICU and started on IV Cardizem  which has been at 15 mg/h. Also started on Eliquis  5 mg twice daily this morning. He is being treated for a CHF exacerbation was initially on PO Lasix  20 mg daily but received IV Lasix  40 mg x  1 yesterday.  In talking with the patient and his wife today, he reports being asymptomatic with his atrial fibrillation/flutter. No chest pain or palpitations. Says his breathing has significantly improved since admission. Is frustrated he is still admitted as he was initially told he could possibly go home today (prior to developing the arrhythmia last night). He is unaware of any personal history of CAD, CHF or cardiac arrhythmias. No known family history of atrial fibrillation or flutter.    Past Medical History:  Diagnosis Date   COPD (chronic obstructive pulmonary disease) (HCC)    Gout    Hyperlipidemia    Hypertension     Past Surgical History:  Procedure Laterality Date   CATARACT EXTRACTION     COMPRESSION HIP SCREW Left 02/17/2020   Procedure: OPEN TREATMENT INTERNAL FIXATION LEFT HIP;  Surgeon: Margrette Taft BRAVO, MD;  Location: [facility];  Service: Orthopedics;  Laterality: Left;     Home Medications:  Prior to Admission medications   Medication Sig Start Date End Date Taking? Authorizing Provider  acetaminophen  (TYLENOL ) 500 MG tablet Take 500 mg by mouth every 6 (six) hours as needed for moderate pain (pain score 4-6).   Yes [provider]  albuterol  (VENTOLIN  HFA) 108 (90 Base) MCG/ACT inhaler Inhale 2 puffs into the lungs every 6 (six) hours as needed for wheezing or shortness of breath. **NEEDS TO BE SEEN BEFORE NEXT REFILL** 01/02/24  Yes [name], Christy A, FNP  Budeson-Glycopyrrol-Formoterol  (BREZTRI  AEROSPHERE) 160-9-4.8 MCG/ACT AERO Inhale 2 puffs into the lungs 2 (two)  times daily. 04/22/23  Yes [name], Christy A, FNP  cetirizine  (ZYRTEC ) 10 MG tablet Take 1 tablet (10 mg total) by mouth daily. 01/02/24  Yes [name], Christy A, FNP  fluticasone  (FLONASE ) 50 MCG/ACT nasal spray Place 2 sprays into both nostrils daily. 01/02/24  Yes [name], Christy A, FNP  furosemide  (LASIX ) 20 MG tablet Take 1 tablet (20 mg total) by mouth daily. 01/02/24  Yes [name], Christy A, FNP   meclizine  (ANTIVERT ) 25 MG tablet Take 1 tablet (25 mg total) by mouth 3 (three) times daily as needed for dizziness. 01/09/24  Yes [name], Christy A, FNP  naproxen sodium (ALEVE) 220 MG tablet Take 220 mg by mouth daily as needed (pain).   Yes [provider]  ondansetron  (ZOFRAN ) 4 MG tablet Take 1 tablet (4 mg total) by mouth every 8 (eight) hours as needed for nausea or vomiting. 01/09/24  Yes [name], Christy A, FNP  sildenafil  (VIAGRA ) 100 MG tablet Take 0.5-1 tablets (50-100 mg total) by mouth daily as needed for erectile dysfunction. 01/02/24  Yes [name], Christy A, FNP  trolamine salicylate (ASPERCREME) 10 % cream Apply 1 application topically as needed for muscle pain.   Yes [provider]  rosuvastatin  (CRESTOR ) 5 MG tablet Take 1 tablet (5 mg total) by mouth daily. 01/03/24   Lavell Bari LABOR, FNP    Scheduled Meds:  amiodarone   150 mg Intravenous Once   apixaban   5 mg Oral BID   arformoterol   15 mcg Nebulization BID   budesonide  (PULMICORT ) nebulizer solution  0.5 mg Nebulization BID   Chlorhexidine  Gluconate Cloth  6 each Topical Q0600   digoxin   0.25 mg Intravenous Q6H   doxycycline   100 mg Oral Q12H   fluticasone   2 spray Each Nare Daily   guaiFENesin   400 mg Oral Q6H   ipratropium-albuterol   3 mL Nebulization TID   loratadine   10 mg Oral Daily   methylPREDNISolone  (SOLU-MEDROL ) injection  60 mg Intravenous BID   nicotine   14 mg Transdermal Daily   rosuvastatin   5 mg Oral Daily   Continuous Infusions:  amiodarone  60 mg/hr (01/30/24 1027)   Followed by   amiodarone      cefTRIAXone  (ROCEPHIN )  IV 2 g (01/30/24 0917)   diltiazem  (CARDIZEM ) infusion 15 mg/hr (01/30/24 0143)   PRN Meds: acetaminophen  **OR** acetaminophen , bisacodyl , dextromethorphan , fentaNYL  (SUBLIMAZE ) injection, ipratropium-albuterol , meclizine , ondansetron  **OR** ondansetron  (ZOFRAN ) IV, oxyCODONE , traZODone   Allergies:    Allergies  Allergen Reactions   Penicillins Swelling and Other  (See Comments)    Social History:   Social History   Socioeconomic History   Marital status: Married    Spouse name: [name]   Number of children: 1   Years of education: Not on file   Highest education level: 9th grade  Occupational History   Occupation: Retired    Associate Professor: [employer]  Tobacco Use   Smoking status: Every Day    Current packs/day: 0.50    Average packs/day: 0.5 packs/day for 48.6 years (24.3 ttl pk-yrs)    Types: Cigarettes    Start date: 07/10/1975   Smokeless tobacco: Never  Vaping Use   Vaping status: Never Used  Substance and Sexual Activity   Alcohol use: Yes    Alcohol/week: 24.0 standard drinks of alcohol    Types: 24 Standard drinks or equivalent per week    Comment: couple of beers daily   Drug use: No   Sexual activity: Not Currently  Other Topics Concern   Not on file  Social History Narrative   Lives at home with wife - one level living   Social Drivers of Health   Financial Resource Strain: Low Risk  (12/03/2023)   Overall Financial Resource Strain (CARDIA)    Difficulty of Paying Living Expenses: Not hard at all  Food Insecurity: No Food Insecurity (01/27/2024)   Hunger Vital Sign    Worried About Running Out of Food in the Last Year: Never true    Ran Out of Food in the Last Year: Never true  Transportation Needs: No Transportation Needs (01/27/2024)   PRAPARE - Administrator, Civil Service (Medical): No    Lack of Transportation (Non-Medical): No  Physical Activity: Insufficiently Active (12/03/2023)   Exercise Vital Sign    Days of Exercise per Week: 3 days    Minutes of Exercise per Session: 20 min  Stress: No Stress Concern Present (12/03/2023)   [employer] of Occupational Health - Occupational Stress Questionnaire    Feeling of Stress : Not at all  Social Connections: Moderately Isolated (01/27/2024)   Social Connection and Isolation Panel    Frequency of Communication with Friends and Family: Three  times a week    Frequency of Social Gatherings with Friends and Family: Twice a week    Attends Religious Services: Never    Database administrator or Organizations: No    Attends Banker Meetings: Never    Marital Status: Married  Catering manager Violence: Not At Risk (01/27/2024)   Humiliation, Afraid, Rape, and Kick questionnaire    Fear of Current or Ex-Partner: No    Emotionally Abused: No    Physically Abused: No    Sexually Abused: No    Family History:   History reviewed. No pertinent family history.   ROS:  Please see the history of present illness.   All other ROS reviewed and negative.     Physical Exam/Data: Vitals:   01/30/24 0830 01/30/24 0900 01/30/24 0930 01/30/24 1000  BP: (!) 107/52 94/61 (!) 104/57 109/63  Pulse: (!) 140  (!) 144 (!) 145  Resp: 20  20 (!) 21  Temp:      TempSrc:      SpO2: 96%  100% 97%  Weight:      Height:        Intake/Output Summary (Last 24 hours) at 01/30/2024 1028 Last data filed at 01/30/2024 0825 Gross per 24 hour  Intake 969.7 ml  Output 1185 ml  Net -215.3 ml      01/29/2024    7:08 PM 01/27/2024    7:55 AM 01/09/2024    2:02 PM  Last 3 Weights  Weight (lbs) 140 lb 3.4 oz 135 lb 5.8 oz 135 lb 6.4 oz  Weight (kg) 63.6 kg 61.4 kg 61.417 kg     Body mass index is 21.32 kg/m.  General:  Well nourished, well developed male appearing in no acute distress.  HEENT: normal Neck: no JVD Vascular: No carotid bruits; Distal pulses 2+ bilaterally Cardiac:  normal S1, S2; Irregularly irregular, tachycardiac Lungs: rhonchi throughout. No wheezing.  Abd: soft, nontender, no hepatomegaly  Ext: 2+ pitting edema bilaterally.  Musculoskeletal:  No deformities, BUE and BLE strength normal and equal Skin: warm and dry  Neuro:  CNs 2-12 intact, no focal abnormalities noted Psych:  Normal affect    Telemetry:  Telemetry was personally reviewed and demonstrates:  In atrial fibrillation overnight with HR in the 80's to  110's. In atrial flutter with RVR  this morning with HR sustaining around 143-144 bpm.   Relevant CV Studies:  Echocardiogram: 01/27/2024 IMPRESSIONS     1. Left ventricular ejection fraction, by estimation, is 60 to 65%. The  left ventricle has normal function. The left ventricle has no regional  wall motion abnormalities. Left ventricular diastolic function could not  be evaluated.   2. Right ventricular systolic function is normal. The right ventricular  size is normal. There is moderately elevated pulmonary artery systolic  pressure.   3. The mitral valve is normal in structure. Trivial mitral valve  regurgitation. No evidence of mitral stenosis.   4. The aortic valve is normal in structure. Aortic valve regurgitation is  not visualized. No aortic stenosis is present.   5. The inferior vena cava is normal in size with greater than 50%  respiratory variability, suggesting right atrial pressure of 3 mmHg.    Laboratory Data: High Sensitivity Troponin:   Recent Labs  Lab 01/27/24 0858  TROPONINIHS 7     Chemistry Recent Labs  Lab 01/28/24 0449 01/29/24 0444 01/30/24 0427  NA 140 139 139  K 3.8 4.4 3.9  CL 108 110 105  CO2 25 21* 25  GLUCOSE 133* 113* 151*  BUN 13 18 23   CREATININE 0.49* 0.47* 0.60*  CALCIUM  8.3* 8.1* 8.9  MG 1.8 2.3 2.0  GFRNONAA >60 >60 >60  ANIONGAP 7 8 9     No results for input(s): PROT, ALBUMIN, AST, ALT, ALKPHOS, BILITOT in the last 168 hours. Lipids No results for input(s): CHOL, TRIG, HDL, LABVLDL, LDLCALC, CHOLHDL in the last 168 hours.  Hematology Recent Labs  Lab 01/28/24 0449 01/29/24 0444 01/30/24 0427  WBC 9.5 19.3* 17.5*  RBC 4.20* 3.90* 4.94  HGB 13.3 12.3* 15.5  HCT 40.8 36.5* 46.7  MCV 97.1 93.6 94.5  MCH 31.7 31.5 31.4  MCHC 32.6 33.7 33.2  RDW 14.0 14.0 14.1  PLT 249 PLATELET CLUMPS NOTED ON SMEAR, UNABLE TO ESTIMATE 334   Thyroid  No results for input(s): TSH, FREET4 in the last 168  hours.  BNP Recent Labs  Lab 01/27/24 0858  BNP 376.0*    DDimer No results for input(s): DDIMER in the last 168 hours.  Radiology/Studies:  Portable chest 1 View Result Date: 01/28/2024 CLINICAL DATA:  Pleural effusion. EXAM: PORTABLE CHEST 1 VIEW COMPARISON:  01/27/2024 FINDINGS: Right base collapse/consolidation with effusion is similar to prior. Left lung is clear. Interstitial markings are diffusely coarsened with chronic features. Cardiopericardial silhouette is at upper limits of normal for size. No acute bony abnormality. IMPRESSION: Right base collapse/consolidation with effusion, similar to prior. Electronically Signed   By: Camellia Candle M.D.   On: 01/28/2024 06:37    US  THORACENTESIS ASP PLEURAL SPACE W/IMG GUIDE Result Date: 01/27/2024 INDICATION: Patient with history of COPD admitted with dyspnea and found to have large right pleural effusion. Request for diagnostic and therapeutic right thoracentesis. EXAM: ULTRASOUND GUIDED RIGHT THORACENTESIS MEDICATIONS: 6 mL 1% lidocaine  COMPLICATIONS: None immediate. PROCEDURE: An ultrasound guided thoracentesis was thoroughly discussed with the patient and questions answered. The benefits, risks, alternatives and complications were also discussed. The patient understands and wishes to proceed with the procedure. Written consent was obtained. Ultrasound was performed to localize and mark an adequate pocket of fluid in the right chest. The area was then prepped and draped in the normal sterile fashion. 1% Lidocaine  was used for local anesthesia. Under ultrasound guidance a 6 Fr Safe-T-Centesis catheter was introduced. Thoracentesis was performed. The catheter was removed and a  dressing applied. FINDINGS: A total of approximately 1.5 L of clear yellow fluid was removed. Samples were sent to the laboratory as requested by the clinical team. IMPRESSION: Successful ultrasound guided right thoracentesis yielding 1.5 L of pleural fluid. Performed by  Clotilda Hesselbach, PA-C Electronically Signed   By: Ester Sides M.D.   On: 01/27/2024 12:55   DG Chest 1 View Result Date: 01/27/2024 CLINICAL DATA:  Post thoracentesis. EXAM: CHEST  1 VIEW COMPARISON:  Radiographs 01/27/2024 and 02/15/2020. FINDINGS: 1129 hours. Interval decreased right pleural effusion with improved aeration of the right lung base. No definite pneumothorax. The heart size and mediastinal contours are stable with mild mediastinal shift to the right. The left lung appears clear. The bones appear unremarkable. IMPRESSION: Interval decreased right pleural effusion with improved aeration of the right lung base but incomplete re-expansion of the right lung. No definite pneumothorax. Electronically Signed   By: Elsie Perone M.D.   On: 01/27/2024 11:59   DG Chest Port 1 View Result Date: 01/27/2024 CLINICAL DATA:  Shortness of breath, wheezing, low O2 sats. EXAM: PORTABLE CHEST 1 VIEW COMPARISON:  02/15/2020. FINDINGS: Trachea is midline. Heart size stable. Large right pleural effusion with right basilar collapse/consolidation. Streaky atelectasis in the left lung base. IMPRESSION: Large right pleural effusion with collapse/consolidation in the right lung base, possibly due to pneumonia. Difficult exclude underlying malignancy. Followup PA and lateral chest X-ray is recommended in 3-4 weeks following trial of antibiotic therapy to ensure resolution and exclude underlying malignancy. Electronically Signed   By: Newell Eke M.D.   On: 01/27/2024 09:25    Assessment and Plan:  1. Atrial Fibrillation/Flutter with RVR - Initially went into atrial fibrillation but rhythm this morning appears most consistent with atrial flutter with HR sustaining around 140 bpm. Suspect his arrhythmia is triggered by his acute illness. TSH at 1.330 when checked in 12/2023. Labs this AM show Mg at 2.0 and K+ 3.9. Echo this admission showed a preserved EF as outlined above and noted to have moderately elevated  PASP at 46.6 mmHg and likely due to his COPD.  - He is on IV Cardizem  at 15 mg/hr and remains tachycardiac and SBP at 104 on most recent check. Reviewed with Dr. Nishan and will start IV Amiodarone  along with IV Digoxin  (ordered 0.25mg  Q6H for 3 doses). Would anticipate a short-course of Amiodarone  given his underlying lung disease.  - Given his CHA2DS2-VASc Score of 3, he has been started on Eliquis  5mg  BID for anticoagulation. Indications for this were reviewed with the patient and his wife.   2. Acute HFpEF - BNP at 376 on admission. He did undergo right-sided thoracentesis on admission with 1.5 L of fluid removed. Echo showed a preserved EF of 60-65% with no regional WMA and normal RV function. He has 2+ pitting edema on examination today and will require additional IV diuresis. Can likely give later this afternoon but would allow for improvement in BP. Can obtain ReDS vest as well.   3. HLD - He has been continued on PTA Crestor  5mg  daily.   4. COPD Exacerbation - Management per the admitting team.   Risk Assessment/Risk Scores:   CHA2DS2-VASc Score = 3   This indicates a 3.2% annual risk of stroke. The patient's score is based upon: CHF History: 0 HTN History: 1 Diabetes History: 0 Stroke History: 0 Vascular Disease History: 1 Age Score: 1 Gender Score: 0    For questions or updates, please contact [facility] Please consult [email or web address]  for contact info under    Signed, Laymon CHRISTELLA Qua, PA-C  01/30/2024 10:28 AM

## 2024-01-30 NOTE — Progress Notes (Signed)
 At [phone] made Dr Tat aware that patients HR 140s (143-144), SV tach, no c/o chest pain, on 15 Cardizem  drip, completed EKG first one showed STEMI, second one showed Sinus Tach posterior infarct, marked ST abnormality, possible inferior subendocardial injury, BP 107/52

## 2024-01-31 ENCOUNTER — Other Ambulatory Visit: Payer: Self-pay | Admitting: Family

## 2024-01-31 DIAGNOSIS — I4892 Unspecified atrial flutter: Secondary | ICD-10-CM

## 2024-01-31 DIAGNOSIS — J42 Unspecified chronic bronchitis: Secondary | ICD-10-CM

## 2024-01-31 DIAGNOSIS — I4891 Unspecified atrial fibrillation: Secondary | ICD-10-CM

## 2024-01-31 DIAGNOSIS — J441 Chronic obstructive pulmonary disease with (acute) exacerbation: Secondary | ICD-10-CM | POA: Diagnosis not present

## 2024-01-31 DIAGNOSIS — I5031 Acute diastolic (congestive) heart failure: Secondary | ICD-10-CM | POA: Diagnosis not present

## 2024-01-31 LAB — MAGNESIUM: Magnesium: 1.9 mg/dL (ref 1.7–2.4)

## 2024-01-31 LAB — BASIC METABOLIC PANEL WITH GFR
Anion gap: 9 (ref 5–15)
BUN: 26 mg/dL — ABNORMAL HIGH (ref 8–23)
CO2: 23 mmol/L (ref 22–32)
Calcium: 8.5 mg/dL — ABNORMAL LOW (ref 8.9–10.3)
Chloride: 104 mmol/L (ref 98–111)
Creatinine, Ser: 0.73 mg/dL (ref 0.61–1.24)
GFR, Estimated: >60 mL/min (ref >60)
Glucose, Bld: 158 mg/dL — ABNORMAL HIGH (ref 70–99)
Potassium: 3.9 mmol/L (ref 3.5–5.1)
Sodium: 136 mmol/L (ref 135–145)

## 2024-01-31 MED ORDER — DILTIAZEM HCL 60 MG PO TABS
90.0000 mg | ORAL_TABLET | Freq: Four times a day (QID) | ORAL | Status: DC
  Administered 2024-01-31 – 2024-02-03 (×11): 90 mg via ORAL
  Filled 2024-01-31 (×12): qty 1

## 2024-01-31 MED ORDER — ALBUTEROL SULFATE (2.5 MG/3ML) 0.083% IN NEBU: 2.5000 mg | INHALATION_SOLUTION | Freq: Three times a day (TID) | RESPIRATORY_TRACT | Status: DC

## 2024-01-31 MED ORDER — REVEFENACIN 175 MCG/3ML IN SOLN
175.0000 ug | Freq: Every day | RESPIRATORY_TRACT | Status: DC
  Administered 2024-01-31 – 2024-02-04 (×5): 175 ug via RESPIRATORY_TRACT
  Filled 2024-01-31 (×5): qty 3

## 2024-01-31 MED ORDER — FUROSEMIDE 10 MG/ML IJ SOLN
40.0000 mg | Freq: Once | INTRAMUSCULAR | Status: AC
  Administered 2024-01-31: 40 mg via INTRAVENOUS
  Filled 2024-01-31: qty 4

## 2024-01-31 NOTE — Progress Notes (Signed)
 Speech Language Pathology Treatment: Dysphagia  Patient Details Name: Elzie Knisley MRN: [id number] DOB: 1950-09-10 Today's Date: 01/31/2024 Time: [id number] SLP Time Calculation (min) (ACUTE ONLY): 25 min  Assessment / Plan / Recommendation Clinical Impression  Pt seen for follow up dysphagia intervention. RN reporting some lethargy/drowsiness- though reported minimal sleep at night. With min verbal cues, pt agreeable to completion of trials of thin liquid via straw and regular solids. No overt or subtle s/sx pharyngeal dysphagia noted. No change to vocal quality across trials. Vitals stable for duration of trials (O2 sat at ~98 on room air). Oral phase complete and efficient for manipulation and clearance. Recommend continuation of regular solids and thin liquids diet. Medications crushed as needed (wife and RN reporting increased comfort/clearance with medications last night and this morning). General aspiration precautions apply in the setting of deconditioning and lethargy- only eat when AWAKE, maintain slow rate/small bites, and sit upright. Education shared regarding impact of lethargy on safety with intake and need to monitor endurance. Pt/spouse reported understanding. No further SLP services indicated.    HPI HPI: 73 year old male with hypertension, hyperlipidemia, COPD, ongoing tobacco use, presented to the emergency department with acute onset shortness of breath and peripheral edema.  EMS found him to be hypoxic at home with oxygen saturation in the mid 80s.  He was wheezing and coughing.  His baseline supplemental oxygen was increased to 3 L with some improvement in oxygen saturation to the mid 90s.  He was given 125 mg of Solu-Medrol  IV by EMS.  He says that he was recently started on Lasix  by his PCP and after starting it was having symptoms of dizziness.  Over the past 2 to 3 days he has had progressive shortness of breath wheezing coughing and weakness.  Patient reports shortness of  breath with minimal exertion which is not his baseline.  He has some shortness of breath symptoms but no fever chills or chest pain.  His chest x-ray showed findings of a large right pleural effusion with compressive atelectasis of the right lung and possible pneumonia.  He had an elevated BNP.  White blood cell count 11.9.  Respiratory panel negative for influenza RSV and SARS coronavirus 2.  Hospital admission was requested for management. Pt with  thoracentesis completed 7/21 and 1.5 L serous fluid removed, fluid sent for testing. BSE requested 01/29/24 due to reports of Pt difficulty swallowing a pill.      SLP Plan  All goals met          Recommendations  Diet recommendations: Regular;Thin liquid Liquids provided via: Cup;Straw Medication Administration: Crushed with puree Supervision: Patient able to self feed;Intermittent supervision to cue for compensatory strategies Compensations: Minimize environmental distractions;Slow rate;Small sips/bites Postural Changes and/or Swallow Maneuvers: Seated upright 90 degrees                  Oral care BID   PRN Dysphagia, unspecified (R13.10)     All goals met    [place] Keani Gotcher Clapp, MS, CCC-SLP Speech Language Pathologist  [place] J Clapp  01/31/2024, 11:47 AM

## 2024-01-31 NOTE — Progress Notes (Signed)
 PT Cancellation Note  Patient Details Name: Victor Shields MRN: [id number] DOB: 1950/08/24   Cancelled Treatment:    Reason Eval/Treat Not Completed: Medical issues which prohibited therapy.  Patient transferred to a higher level of care and will need new PT consult to resume therapy when patient is medically stable.  Thank you.    7:44 AM, 01/31/24 Lynwood Music, MPT Physical Therapist with [facility] [phone] office [phone] mobile phone

## 2024-01-31 NOTE — Plan of Care (Signed)

## 2024-01-31 NOTE — Progress Notes (Addendum)
 Progress Note  Patient Name: Victor Shields Date of Encounter: 01/31/2024  [facility] Cardiologist: Maude Emmer, MD  Patient Profile     Subjective   Breathing improved and denies any chest pain.  Feels tired today  Inpatient Medications    Scheduled Meds:  apixaban   5 mg Oral BID   arformoterol   15 mcg Nebulization BID   budesonide  (PULMICORT ) nebulizer solution  0.5 mg Nebulization BID   Chlorhexidine  Gluconate Cloth  6 each Topical Q0600   clotrimazole   10 mg Oral 5 X Daily   doxycycline   100 mg Oral Q12H   fluticasone   2 spray Each Nare Daily   guaiFENesin   15 mL Oral QID   ipratropium-albuterol   3 mL Nebulization TID   loratadine   10 mg Oral Daily   methylPREDNISolone  (SOLU-MEDROL ) injection  60 mg Intravenous BID   nicotine   14 mg Transdermal Daily   polyethylene glycol  17 g Oral Daily   rosuvastatin   5 mg Oral Daily   Continuous Infusions:  amiodarone  30 mg/hr (01/31/24 0808)   cefTRIAXone  (ROCEPHIN )  IV 2 g (01/31/24 0909)   diltiazem  (CARDIZEM ) infusion 15 mg/hr (01/31/24 0808)   PRN Meds: acetaminophen  **OR** acetaminophen , bisacodyl , dextromethorphan , fentaNYL  (SUBLIMAZE ) injection, ipratropium-albuterol , meclizine , ondansetron  **OR** ondansetron  (ZOFRAN ) IV, oxyCODONE , traZODone    Vital Signs    Vitals:   01/31/24 0300 01/31/24 0400 01/31/24 0735 01/31/24 0858  BP: (!) 117/50 100/75    Pulse: (!) 44     Resp: (!) 22 (!) 22    Temp:  97.9 F (36.6 C) 97.8 F (36.6 C)   TempSrc:  Oral Oral   SpO2: 94%   98%  Weight:      Height:        Intake/Output Summary (Last 24 hours) at 01/31/2024 0925 Last data filed at 01/31/2024 0900 Gross per 24 hour  Intake 1273.39 ml  Output 700 ml  Net 573.39 ml      01/29/2024    7:08 PM 01/27/2024    7:55 AM 01/09/2024    2:02 PM  Last 3 Weights  Weight (lbs) 140 lb 3.4 oz 135 lb 5.8 oz 135 lb 6.4 oz  Weight (kg) 63.6 kg 61.4 kg 61.417 kg      Telemetry    Atrial flutter 95 bpm- Personally  Reviewed  ECG    No new EKG to review- Personally Reviewed  Physical Exam   GEN: No acute distress.   Neck: No JVD Cardiac: Irregularly irregular, no murmurs, rubs, or gallops.  Respiratory: Diffuse inspiratory and expiratory wheezes GI: Soft, nontender, non-distended  MS: Trace bilateral lower extremity edema; No deformity. Neuro:  Nonfocal  Psych: Normal affect   Labs    High Sensitivity Troponin:   Recent Labs  Lab 01/27/24 0858  TROPONINIHS 7      Chemistry Recent Labs  Lab 01/29/24 0444 01/30/24 0427 01/31/24 0420  NA 139 139 136  K 4.4 3.9 3.9  CL 110 105 104  CO2 21* 25 23  GLUCOSE 113* 151* 158*  BUN 18 23 26*  CREATININE 0.47* 0.60* 0.73  CALCIUM  8.1* 8.9 8.5*  GFRNONAA >60 >60 >60  ANIONGAP 8 9 9      Hematology Recent Labs  Lab 01/28/24 0449 01/29/24 0444 01/30/24 0427  WBC 9.5 19.3* 17.5*  RBC 4.20* 3.90* 4.94  HGB 13.3 12.3* 15.5  HCT 40.8 36.5* 46.7  MCV 97.1 93.6 94.5  MCH 31.7 31.5 31.4  MCHC 32.6 33.7 33.2  RDW 14.0 14.0 14.1  PLT 249 PLATELET CLUMPS NOTED ON SMEAR, UNABLE TO ESTIMATE 334    BNP Recent Labs  Lab 01/27/24 0858  BNP 376.0*     DDimer No results for input(s): DDIMER in the last 168 hours.   CHA2DS2-VASc Score = 3   This indicates a 3.2% annual risk of stroke. The patient's score is based upon: CHF History: 0 HTN History: 1 Diabetes History: 0 Stroke History: 0 Vascular Disease History: 1 Age Score: 1 Gender Score: 0      Radiology    CT CHEST WO CONTRAST Result Date: 01/30/2024 CLINICAL DATA:  Rhonchi and wheezing.  Decreased breath sounds. EXAM: CT CHEST WITHOUT CONTRAST TECHNIQUE: Multidetector CT imaging of the chest was performed following the standard protocol without IV contrast. RADIATION DOSE REDUCTION: This exam was performed according to the departmental dose-optimization program which includes automated exposure control, adjustment of the mA and/or kV according to patient size and/or use  of iterative reconstruction technique. COMPARISON:  January 28, 2024. FINDINGS: Cardiovascular: Atherosclerosis of thoracic aorta is noted without aneurysm formation. Normal cardiac size. No pericardial effusion. Coronary artery calcifications are noted. Mediastinum/Nodes: No enlarged mediastinal or axillary lymph nodes. Thyroid  gland, trachea, and esophagus demonstrate no significant findings. Lungs/Pleura: Mild to moderate right pleural effusion is noted. Small left pleural effusion is noted. No pneumothorax is noted. Emphysematous disease is noted. Right lower lobe and middle lobe opacities are noted concerning for pneumonia or atelectasis. Upper Abdomen: Possible nodular hepatic contours are noted suggesting cirrhosis. Musculoskeletal: No chest wall mass or suspicious bone lesions identified. IMPRESSION: Bilateral pleural effusions as noted above, right greater than left. Right lower lobe and middle lobe opacities are noted concerning for pneumonia or atelectasis. Coronary artery calcifications are noted. 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 Aortic Atherosclerosis (ICD10-I70.0) and Emphysema (ICD10-J43.9). Electronically Signed   By: Lynwood Landy Raddle M.D.   On: 01/30/2024 16:08    Patient Profile     73 y.o. male  with a hx of HTN, HLD and COPD who is being seen 01/30/2024 for the evaluation of atrial fibrillation with RVR at the request of Dr. Evonnie.   Assessment & Plan    1. Atrial Fibrillation/Flutter with RVR - Initially went into atrial fibrillation but rhythm this morning appears most consistent with atrial flutter with HR sustaining around 140 bpm.  - Suspect his arrhythmia is triggered by his acute illness.  - TSH at 1.330 when checked in 12/2023.  - K+ today 3.9 and mag 1.9 - Echo this admission showed a preserved EF as outlined above and noted to have moderately elevated PASP at 46.6 mmHg and likely due to his COPD.  - Heart rate continued to be rapid despite IV Cardizem  15 mg/h so yesterday he was  given digoxin  0.25 mg every 6 hours x 3 doses as well as IV amiodarone   - Would anticipate a short-course of Amiodarone  given his underlying lung disease.  - Given his CHA2DS2-VASc Score of 3, he has been started on Eliquis  5mg  BID for anticoagulation.  - Heart rate much improved today currently in atrial fibrillation at heart rate of 95 bpm on telemetry - Continue apixaban  5 mg twice daily - Will transition IV Cardizem  which is at 15 mg/h to short acting Cardizem  90 mg every 6 hours - Continue IV amiodarone  for now and if heart rate remains controlled can transition tomorrow to p.o. 200 mg twice daily   2. Acute HFpEF - BNP at 376 on admission. He did undergo right-sided thoracentesis on admission with 1.5  L of fluid removed.  - Echo showed a preserved EF of 60-65% with no regional WMA and normal RV function.  - He has 2+ pitting edema on examination but appears improved today with trace edema - He put out 700 cc yesterday and is net +1.13 L since admission - Would give another dose of Lasix  IV 40 mg today and follow strict I's and O's and daily weights - Can reassess need for further IV diuretics tomorrow but may be nearing ability to go back on home diuretic dose  3. HLD - He has been continued on PTA Crestor  5mg  daily.    4. COPD Exacerbation - Management per the admitting team.    I spent 35 minutes caring for this patient today face to face, ordering and reviewing labs, reviewing records from 2D echo  seeing the patient, documenting in the record  For questions or updates, please contact [facility] Please consult [email or web address] for contact info under        Signed, Wilbert Bihari, MD  01/31/2024, 9:25 AM

## 2024-01-31 NOTE — Progress Notes (Signed)
 PROGRESS NOTE  Quantez Schnyder FMW:[id number] DOB: [birth year] DOA: 01/27/2024 PCP: Lavell Bari LABOR, FNP  Brief History:  73 year old male with hypertension, hyperlipidemia, COPD, ongoing tobacco use, presented to the emergency department with acute onset shortness of breath and peripheral edema.  EMS found him to be hypoxic at home with oxygen saturation in the mid 80s.  He was wheezing and coughing.  His baseline supplemental oxygen was increased to 3 L with some improvement in oxygen saturation to the mid 90s.  He was given 125 mg of Solu-Medrol  IV by EMS.  He says that he was recently started on Lasix  by his PCP and after starting it was having symptoms of dizziness.  Over the past 2 to 3 days he has had progressive shortness of breath wheezing coughing and weakness.  Patient reports shortness of breath with minimal exertion which is not his baseline.  He has some shortness of breath symptoms but no fever chills or chest pain.  His chest x-ray showed findings of a large right pleural effusion with compressive atelectasis of the right lung and possible pneumonia.  He had an elevated BNP.  White blood cell count 11.9.  Respiratory panel negative for influenza RSV and SARS coronavirus 2.  Hospital admission was requested for management. Patient was started on IV Solu-Medrol  and bronchodilators with gradual improvement.  He was weaned off oxygen.  He underwent thoracocentesis removing 1.5 L.  He was given a dose of IV furosemide  for his fluid overload.  Unfortunately, the patient developed atrial fibrillation with RVR on the evening of 01/29/2024.  He was placed on diltiazem  drip.  His rates remained elevated in the 140s.  Cardiology was consulted to assist with management.   Assessment/Plan: Acute respiratory failure with hypoxia -- Secondary to large right pleural effusion -- Secondary to acute COPD exacerbation and CHF -- thoracentesis completed 7/21 and 1.5 L serous fluid removed, fluid  sent for testing  -- added incentive spirometry 7/22 - initially on 2L>>RA   Acute COPD exacerbation -- continue IV solumedrol -continue duonebs -continue pulmicort  -continue brovana  -add yupelri -- added flutter valve   Acute HFpEF -7/21 Echo EF 60-65%, no wMA, normal RVF, mod elevated PASP -s/p thora 7/21>>1.5 L removed>>transudative -remains clinically fluid overloaded -lasix  IV 40 x 1 on 7/23, 7/24, 7/25 -check ReDS -daily weights -I/Os incomplete   Atrial fibrillation with RVR -started on diltiazem  drip 7/23 -HRs remain in 140s  -will need to tolerate mildly elevated HRs in setting of acute medical illness -cardiology consult appreciated -TSH -CHADSVASc = 3--start apixaban  -transition to po diltiazem  7/25, started on amio IV 7/24 -given digoxin  x 3 on 7/24   Tobacco -- Nicotine  patch ordered for cravings --cessation discussed   Mixed Hyperlipidemia -continue statin         Family Communication:   spouse at bedside 7/25   Consultants:  none   Code Status:  FULL    DVT Prophylaxis:  apixaban      Procedures: As Listed in Progress Note Above   Antibiotics: Ceftriaxone  7/21>>7/25 Doxy 7/21>>7/25       Subjective: Pt is breathing better.  Denies f/c, cp, sob, n/v/d, abd pain  Objective: Vitals:   01/31/24 1000 01/31/24 1100 01/31/24 1130 01/31/24 1206  BP: (!) 132/31 (!) 136/90 (!) 132/57 107/72  Pulse:  70 76   Resp: (!) 21 17 (!) 21   Temp:      TempSrc:      SpO2:  96% 96% 96%   Weight:      Height:        Intake/Output Summary (Last 24 hours) at 01/31/2024 1317 Last data filed at 01/31/2024 0900 Gross per 24 hour  Intake 891.42 ml  Output 700 ml  Net 191.42 ml   Weight change:  Exam:  General:  Pt is alert, follows commands appropriately, not in acute distress HEENT: No icterus, No thrush, No neck mass, [place]/AT Cardiovascular: RRR, S1/S2, no rubs, no gallops Respiratory: bibasilar  crackles.  Diminished BS.   Abdomen: Soft/+BS,  non tender, non distended, no guarding Extremities: 1 + LE edema, No lymphangitis, No petechiae, No rashes, no synovitis   Data Reviewed: I have personally reviewed following labs and imaging studies Basic Metabolic Panel: Recent Labs  Lab 01/27/24 0858 01/28/24 0449 01/29/24 0444 01/30/24 0427 01/31/24 0420  NA 141 140 139 139 136  K 4.2 3.8 4.4 3.9 3.9  CL 104 108 110 105 104  CO2 25 25 21* 25 23  GLUCOSE 105* 133* 113* 151* 158*  BUN 10 13 18 23  26*  CREATININE 0.55* 0.49* 0.47* 0.60* 0.73  CALCIUM  8.6* 8.3* 8.1* 8.9 8.5*  MG 1.7 1.8 2.3 2.0 1.9   Liver Function Tests: No results for input(s): AST, ALT, ALKPHOS, BILITOT, PROT, ALBUMIN in the last 168 hours. No results for input(s): LIPASE, AMYLASE in the last 168 hours. No results for input(s): AMMONIA in the last 168 hours. Coagulation Profile: No results for input(s): INR, PROTIME in the last 168 hours. CBC: Recent Labs  Lab 01/27/24 0858 01/28/24 0449 01/29/24 0444 01/30/24 0427  WBC 11.9* 9.5 19.3* 17.5*  NEUTROABS 10.2* 8.5* 17.3* 15.8*  HGB 14.7 13.3 12.3* 15.5  HCT 45.4 40.8 36.5* 46.7  MCV 97.0 97.1 93.6 94.5  PLT 275 249 PLATELET CLUMPS NOTED ON SMEAR, UNABLE TO ESTIMATE 334   Cardiac Enzymes: No results for input(s): CKTOTAL, CKMB, CKMBINDEX, TROPONINI in the last 168 hours. BNP: Invalid input(s): POCBNP CBG: No results for input(s): GLUCAP in the last 168 hours. HbA1C: No results for input(s): HGBA1C in the last 72 hours. Urine analysis:    Component Value Date/Time   APPEARANCEUR Cloudy (A) 05/19/2021 1459   GLUCOSEU Negative 05/19/2021 1459   BILIRUBINUR Negative 05/19/2021 1459   PROTEINUR 1+ (A) 05/19/2021 1459   NITRITE Negative 05/19/2021 1459   LEUKOCYTESUR Negative 05/19/2021 1459   Sepsis Labs: @LABRCNTIP (procalcitonin:4,lacticidven:4) ) Recent Results (from the past 240 hours)  Resp panel by RT-PCR (RSV, Flu A&B, Covid) Anterior Nasal Swab      Status: None   Collection Time: 01/27/24  9:15 AM   Specimen: Anterior Nasal Swab  Result Value Ref Range Status   SARS Coronavirus 2 by RT PCR NEGATIVE NEGATIVE Final    Comment: (NOTE) SARS-CoV-2 target nucleic acids are NOT DETECTED.  The SARS-CoV-2 RNA is generally detectable in upper respiratory specimens during the acute phase of infection. The lowest concentration of SARS-CoV-2 viral copies this assay can detect is 138 copies/mL. A negative result does not preclude SARS-Cov-2 infection and should not be used as the sole basis for treatment or other patient management decisions. A negative result may occur with  improper specimen collection/handling, submission of specimen other than nasopharyngeal swab, presence of viral mutation(s) within the areas targeted by this assay, and inadequate number of viral copies(<138 copies/mL). A negative result must be combined with clinical observations, patient history, and epidemiological information. The expected result is Negative.  Fact Sheet for Patients:  BloggerCourse.com  Fact  Sheet for Healthcare Providers:  SeriousBroker.it  This test is no t yet approved or cleared by the [place]  FDA and  has been authorized for detection and/or diagnosis of SARS-CoV-2 by FDA under an Emergency Use Authorization (EUA). This EUA will remain  in effect (meaning this test can be used) for the duration of the COVID-19 declaration under Section 564(b)(1) of the Act, 21 U.S.C.section 360bbb-3(b)(1), unless the authorization is terminated  or revoked sooner.       Influenza A by PCR NEGATIVE NEGATIVE Final   Influenza B by PCR NEGATIVE NEGATIVE Final    Comment: (NOTE) The Xpert Xpress SARS-CoV-2/FLU/RSV plus assay is intended as an aid in the diagnosis of influenza from Nasopharyngeal swab specimens and should not be used as a sole basis for treatment. Nasal washings and aspirates are  unacceptable for Xpert Xpress SARS-CoV-2/FLU/RSV testing.  Fact Sheet for Patients: BloggerCourse.com  Fact Sheet for Healthcare Providers: SeriousBroker.it  This test is not yet approved or cleared by the [place]  FDA and has been authorized for detection and/or diagnosis of SARS-CoV-2 by FDA under an Emergency Use Authorization (EUA). This EUA will remain in effect (meaning this test can be used) for the duration of the COVID-19 declaration under Section 564(b)(1) of the Act, 21 U.S.C. section 360bbb-3(b)(1), unless the authorization is terminated or revoked.     Resp Syncytial Virus by PCR NEGATIVE NEGATIVE Final    Comment: (NOTE) Fact Sheet for Patients: BloggerCourse.com  Fact Sheet for Healthcare Providers: SeriousBroker.it  This test is not yet approved or cleared by the [place]  FDA and has been authorized for detection and/or diagnosis of SARS-CoV-2 by FDA under an Emergency Use Authorization (EUA). This EUA will remain in effect (meaning this test can be used) for the duration of the COVID-19 declaration under Section 564(b)(1) of the Act, 21 U.S.C. section 360bbb-3(b)(1), unless the authorization is terminated or revoked.  Performed at [facility], [address] [place], [place] [address]   Culture, blood (Routine X 2) w Reflex to ID Panel     Status: None (Preliminary result)   Collection Time: 01/27/24 10:39 AM   Specimen: BLOOD  Result Value Ref Range Status   Specimen Description BLOOD BLOOD RIGHT ARM  Final   Special Requests   Final    BOTTLES DRAWN AEROBIC AND ANAEROBIC Blood Culture adequate volume   Culture   Final    NO GROWTH 4 DAYS Performed at [facility], [address] [place], [place] [address]    Report Status PENDING  Incomplete  Culture, blood (Routine X 2) w Reflex to ID Panel     Status: None (Preliminary result)   Collection  Time: 01/27/24 10:45 AM   Specimen: BLOOD  Result Value Ref Range Status   Specimen Description BLOOD BLOOD LEFT ARM  Final   Special Requests   Final    BOTTLES DRAWN AEROBIC ONLY Blood Culture results may not be optimal due to an inadequate volume of blood received in culture bottles   Culture   Final    NO GROWTH 4 DAYS Performed at [facility], [address] [place], [place] [address]    Report Status PENDING  Incomplete  MRSA Next Gen by PCR, Nasal     Status: None   Collection Time: 01/29/24  7:00 PM   Specimen: Nasal Mucosa; Nasal Swab  Result Value Ref Range Status   MRSA by PCR Next Gen NOT DETECTED NOT DETECTED Final    Comment: (NOTE) The GeneXpert  MRSA Assay (FDA approved for NASAL specimens only), is one component of a comprehensive MRSA colonization surveillance program. It is not intended to diagnose MRSA infection nor to guide or monitor treatment for MRSA infections. Test performance is not FDA approved in patients less than 73 years old. Performed at [facility], [address] [place],  [address]      Scheduled Meds:  apixaban   5 mg Oral BID   arformoterol   15 mcg Nebulization BID   budesonide  (PULMICORT ) nebulizer solution  0.5 mg Nebulization BID   Chlorhexidine  Gluconate Cloth  6 each Topical Q0600   clotrimazole   10 mg Oral 5 X Daily   diltiazem   90 mg Oral Q6H   doxycycline   100 mg Oral Q12H   fluticasone   2 spray Each Nare Daily   guaiFENesin   15 mL Oral QID   ipratropium-albuterol   3 mL Nebulization TID   loratadine   10 mg Oral Daily   methylPREDNISolone  (SOLU-MEDROL ) injection  60 mg Intravenous BID   nicotine   14 mg Transdermal Daily   polyethylene glycol  17 g Oral Daily   rosuvastatin   5 mg Oral Daily   Continuous Infusions:  amiodarone  30 mg/hr (01/31/24 1137)    Procedures/Studies: CT CHEST WO CONTRAST Result Date: 01/30/2024 CLINICAL DATA:  Rhonchi and wheezing.  Decreased breath sounds. EXAM: CT CHEST WITHOUT CONTRAST  TECHNIQUE: Multidetector CT imaging of the chest was performed following the standard protocol without IV contrast. RADIATION DOSE REDUCTION: This exam was performed according to the departmental dose-optimization program which includes automated exposure control, adjustment of the mA and/or kV according to patient size and/or use of iterative reconstruction technique. COMPARISON:  January 28, 2024. FINDINGS: Cardiovascular: Atherosclerosis of thoracic aorta is noted without aneurysm formation. Normal cardiac size. No pericardial effusion. Coronary artery calcifications are noted. Mediastinum/Nodes: No enlarged mediastinal or axillary lymph nodes. Thyroid  gland, trachea, and esophagus demonstrate no significant findings. Lungs/Pleura: Mild to moderate right pleural effusion is noted. Small left pleural effusion is noted. No pneumothorax is noted. Emphysematous disease is noted. Right lower lobe and middle lobe opacities are noted concerning for pneumonia or atelectasis. Upper Abdomen: Possible nodular hepatic contours are noted suggesting cirrhosis. Musculoskeletal: No chest wall mass or suspicious bone lesions identified. IMPRESSION: Bilateral pleural effusions as noted above, right greater than left. Right lower lobe and middle lobe opacities are noted concerning for pneumonia or atelectasis. Coronary artery calcifications are noted. 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 Aortic Atherosclerosis (ICD10-I70.0) and Emphysema (ICD10-J43.9). Electronically Signed   By: Lynwood Landy Raddle M.D.   On: 01/30/2024 16:08   Portable chest 1 View Result Date: 01/28/2024 CLINICAL DATA:  Pleural effusion. EXAM: PORTABLE CHEST 1 VIEW COMPARISON:  01/27/2024 FINDINGS: Right base collapse/consolidation with effusion is similar to prior. Left lung is clear. Interstitial markings are diffusely coarsened with chronic features. Cardiopericardial silhouette is at upper limits of normal for size. No acute bony abnormality. IMPRESSION: Right base  collapse/consolidation with effusion, similar to prior. Electronically Signed   By: Camellia Candle M.D.   On: 01/28/2024 06:37   ECHOCARDIOGRAM COMPLETE Result Date: 01/27/2024    ECHOCARDIOGRAM REPORT   Patient Name:   [facility] Aeva Posey [place] Date of Exam: 01/27/2024 Medical Rec #:  [id number]           Height:       68.0 in Accession #:    [id number]          Weight:       135.4 lb Date of Birth:  1951-05-25  BSA:          1.731 m Patient Age:    73 years            BP:           159/72 mmHg Patient Gender: M                   HR:           110 bpm. Exam Location:  [facility] Procedure: 2D Echo, Cardiac Doppler and Color Doppler (Both Spectral and Color            Flow Doppler were utilized during procedure). Indications:    dyspnea  History:        Patient has no prior history of Echocardiogram examinations.                 COPD; Risk Factors:Current Smoker.  Sonographer:    Benard Stallion Referring Phys: [phone] CLANFORD L JOHNSON IMPRESSIONS  1. Left ventricular ejection fraction, by estimation, is 60 to 65%. The left ventricle has normal function. The left ventricle has no regional wall motion abnormalities. Left ventricular diastolic function could not be evaluated.  2. Right ventricular systolic function is normal. The right ventricular size is normal. There is moderately elevated pulmonary artery systolic pressure.  3. The mitral valve is normal in structure. Trivial mitral valve regurgitation. No evidence of mitral stenosis.  4. The aortic valve is normal in structure. Aortic valve regurgitation is not visualized. No aortic stenosis is present.  5. The inferior vena cava is normal in size with greater than 50% respiratory variability, suggesting right atrial pressure of 3 mmHg. FINDINGS  Left Ventricle: Left ventricular ejection fraction, by estimation, is 60 to 65%. The left ventricle has normal function. The left ventricle has no regional wall motion abnormalities. The left ventricular internal  cavity size was normal in size. There is  no left ventricular hypertrophy. Left ventricular diastolic function could not be evaluated. Right Ventricle: The right ventricular size is normal. No increase in right ventricular wall thickness. Right ventricular systolic function is normal. There is moderately elevated pulmonary artery systolic pressure. The tricuspid regurgitant velocity is 3.30 m/s, and with an assumed right atrial pressure of 3 mmHg, the estimated right ventricular systolic pressure is 46.6 mmHg. Left Atrium: Left atrial size was normal in size. Right Atrium: Right atrial size was normal in size. Pericardium: There is no evidence of pericardial effusion. Mitral Valve: The mitral valve is normal in structure. Trivial mitral valve regurgitation. No evidence of mitral valve stenosis. Tricuspid Valve: The tricuspid valve is normal in structure. Tricuspid valve regurgitation is mild . No evidence of tricuspid stenosis. Aortic Valve: The aortic valve is normal in structure. Aortic valve regurgitation is not visualized. No aortic stenosis is present. Aortic valve mean gradient measures 3.0 mmHg. Aortic valve peak gradient measures 6.5 mmHg. Aortic valve area, by VTI measures 3.31 cm. Pulmonic Valve: The pulmonic valve was normal in structure. Pulmonic valve regurgitation is not visualized. No evidence of pulmonic stenosis. Aorta: The aortic root is normal in size and structure. Venous: The inferior vena cava is normal in size with greater than 50% respiratory variability, suggesting right atrial pressure of 3 mmHg. IAS/Shunts: No atrial level shunt detected by color flow Doppler.  LEFT VENTRICLE PLAX 2D LVIDd:         4.95 cm   Diastology LVIDs:         3.70 cm   LV e' medial:  7.51 cm/s LV PW:         0.85 cm   LV e' lateral: 9.25 cm/s LV IVS:        0.85 cm LVOT diam:     2.20 cm LV SV:         65 LV SV Index:   37 LVOT Area:     3.80 cm  RIGHT VENTRICLE RV Basal diam:  3.25 cm RV Mid diam:    3.15 cm RV  S prime:     18.60 cm/s TAPSE (M-mode): 2.4 cm LEFT ATRIUM           Index        RIGHT ATRIUM           Index LA Vol (A4C): 67.0 ml 38.70 ml/m  RA Area:     12.30 cm                                    RA Volume:   32.30 ml  18.66 ml/m  AORTIC VALVE AV Area (Vmax):    3.35 cm AV Area (Vmean):   3.30 cm AV Area (VTI):     3.31 cm AV Vmax:           127.00 cm/s AV Vmean:          83.400 cm/s AV VTI:            0.195 m AV Peak Grad:      6.5 mmHg AV Mean Grad:      3.0 mmHg LVOT Vmax:         112.00 cm/s LVOT Vmean:        72.300 cm/s LVOT VTI:          0.170 m LVOT/AV VTI ratio: 0.87  AORTA Ao Root diam: 3.20 cm TRICUSPID VALVE TR Peak grad:   43.6 mmHg TR Vmax:        330.00 cm/s  SHUNTS Systemic VTI:  0.17 m Systemic Diam: 2.20 cm Wilbert Bihari MD Electronically signed by Wilbert Bihari MD Signature Date/Time: 01/27/2024/6:41:20 PM    Final    US  THORACENTESIS ASP PLEURAL SPACE W/IMG GUIDE Result Date: 01/27/2024 INDICATION: Patient with history of COPD admitted with dyspnea and found to have large right pleural effusion. Request for diagnostic and therapeutic right thoracentesis. EXAM: ULTRASOUND GUIDED RIGHT THORACENTESIS MEDICATIONS: 6 mL 1% lidocaine  COMPLICATIONS: None immediate. PROCEDURE: An ultrasound guided thoracentesis was thoroughly discussed with the patient and questions answered. The benefits, risks, alternatives and complications were also discussed. The patient understands and wishes to proceed with the procedure. Written consent was obtained. Ultrasound was performed to localize and mark an adequate pocket of fluid in the right chest. The area was then prepped and draped in the normal sterile fashion. 1% Lidocaine  was used for local anesthesia. Under ultrasound guidance a 6 Fr Safe-T-Centesis catheter was introduced. Thoracentesis was performed. The catheter was removed and a dressing applied. FINDINGS: A total of approximately 1.5 L of clear yellow fluid was removed. Samples were sent to the  laboratory as requested by the clinical team. IMPRESSION: Successful ultrasound guided right thoracentesis yielding 1.5 L of pleural fluid. Performed by Clotilda Hesselbach, PA-C Electronically Signed   By: Ester Sides M.D.   On: 01/27/2024 12:55   DG Chest 1 View Result Date: 01/27/2024 CLINICAL DATA:  Post thoracentesis. EXAM: CHEST  1 VIEW COMPARISON:  Radiographs 01/27/2024 and 02/15/2020. FINDINGS: 1129  hours. Interval decreased right pleural effusion with improved aeration of the right lung base. No definite pneumothorax. The heart size and mediastinal contours are stable with mild mediastinal shift to the right. The left lung appears clear. The bones appear unremarkable. IMPRESSION: Interval decreased right pleural effusion with improved aeration of the right lung base but incomplete re-expansion of the right lung. No definite pneumothorax. Electronically Signed   By: Elsie Perone M.D.   On: 01/27/2024 11:59   DG Chest Port 1 View Result Date: 01/27/2024 CLINICAL DATA:  Shortness of breath, wheezing, low O2 sats. EXAM: PORTABLE CHEST 1 VIEW COMPARISON:  02/15/2020. FINDINGS: Trachea is midline. Heart size stable. Large right pleural effusion with right basilar collapse/consolidation. Streaky atelectasis in the left lung base. IMPRESSION: Large right pleural effusion with collapse/consolidation in the right lung base, possibly due to pneumonia. Difficult exclude underlying malignancy. Followup PA and lateral chest X-ray is recommended in 3-4 weeks following trial of antibiotic therapy to ensure resolution and exclude underlying malignancy. Electronically Signed   By: Newell Eke M.D.   On: 01/27/2024 09:25    Alm Schneider, DO  [facility]  If 7PM-7AM, please contact night-coverage [email or web address] Password [email or web address] 01/31/2024, 1:17 PM   LOS: 4 days

## 2024-01-31 NOTE — Progress Notes (Signed)
 1128 Wife called nurse to room, patient drowsy

## 2024-02-01 DIAGNOSIS — I5031 Acute diastolic (congestive) heart failure: Secondary | ICD-10-CM | POA: Diagnosis not present

## 2024-02-01 DIAGNOSIS — I480 Paroxysmal atrial fibrillation: Secondary | ICD-10-CM | POA: Diagnosis not present

## 2024-02-01 DIAGNOSIS — J9601 Acute respiratory failure with hypoxia: Secondary | ICD-10-CM | POA: Diagnosis not present

## 2024-02-01 LAB — MAGNESIUM: Magnesium: 1.8 mg/dL (ref 1.7–2.4)

## 2024-02-01 LAB — BASIC METABOLIC PANEL WITH GFR
Anion gap: 9 (ref 5–15)
BUN: 24 mg/dL — ABNORMAL HIGH (ref 8–23)
CO2: 26 mmol/L (ref 22–32)
Calcium: 8.5 mg/dL — ABNORMAL LOW (ref 8.9–10.3)
Chloride: 101 mmol/L (ref 98–111)
Creatinine, Ser: 0.69 mg/dL (ref 0.61–1.24)
GFR, Estimated: >60 mL/min (ref >60)
Glucose, Bld: 154 mg/dL — ABNORMAL HIGH (ref 70–99)
Potassium: 4 mmol/L (ref 3.5–5.1)
Sodium: 136 mmol/L (ref 135–145)

## 2024-02-01 LAB — CULTURE, BLOOD (ROUTINE X 2)
Culture: NO GROWTH
Culture: NO GROWTH
Special Requests: ADEQUATE

## 2024-02-01 MED ORDER — LEVALBUTEROL HCL 0.63 MG/3ML IN NEBU
0.6300 mg | INHALATION_SOLUTION | Freq: Three times a day (TID) | RESPIRATORY_TRACT | Status: DC
  Administered 2024-02-02 – 2024-02-03 (×5): 0.63 mg via RESPIRATORY_TRACT
  Filled 2024-02-01 (×5): qty 3

## 2024-02-01 MED ORDER — ALBUTEROL SULFATE (2.5 MG/3ML) 0.083% IN NEBU
2.5000 mg | INHALATION_SOLUTION | Freq: Two times a day (BID) | RESPIRATORY_TRACT | Status: DC
  Administered 2024-02-01: 2.5 mg via RESPIRATORY_TRACT
  Filled 2024-02-01: qty 3

## 2024-02-01 MED ORDER — ORAL CARE MOUTH RINSE: 15.0000 mL | OROMUCOSAL | Status: DC | PRN

## 2024-02-01 MED ORDER — FUROSEMIDE 10 MG/ML IJ SOLN
60.0000 mg | Freq: Once | INTRAMUSCULAR | Status: AC
  Administered 2024-02-01: 60 mg via INTRAVENOUS
  Filled 2024-02-01: qty 6

## 2024-02-01 MED ORDER — LEVALBUTEROL HCL 0.63 MG/3ML IN NEBU
0.6300 mg | INHALATION_SOLUTION | Freq: Three times a day (TID) | RESPIRATORY_TRACT | Status: DC
  Administered 2024-02-01 (×2): 0.63 mg via RESPIRATORY_TRACT
  Filled 2024-02-01 (×2): qty 3

## 2024-02-01 MED ORDER — FUROSEMIDE 10 MG/ML IJ SOLN: 40.0000 mg | Freq: Once | INTRAMUSCULAR | Status: DC

## 2024-02-01 MED ORDER — MAGNESIUM SULFATE 2 GM/50ML IV SOLN
2.0000 g | Freq: Once | INTRAVENOUS | Status: AC
  Administered 2024-02-01: 2 g via INTRAVENOUS
  Filled 2024-02-01: qty 50

## 2024-02-01 NOTE — Plan of Care (Signed)
  Problem: Education: Goal: Knowledge of General Education information will improve Description: Including pain rating scale, medication(s)/side effects and non-pharmacologic comfort measures Outcome: Progressing   Problem: Clinical Measurements: Goal: Ability to maintain clinical measurements within normal limits will improve Outcome: Progressing Goal: Respiratory complications will improve Outcome: Progressing Goal: Cardiovascular complication will be avoided Outcome: Progressing   Problem: Skin Integrity: Goal: Risk for impaired skin integrity will decrease Outcome: Progressing

## 2024-02-01 NOTE — Progress Notes (Signed)
 PROGRESS NOTE  Victor Shields FMW:[id number] DOB: Feb 24, 1951 DOA: 01/27/2024 PCP: Lavell Bari LABOR, FNP  Brief History:  73 year old male with hypertension, hyperlipidemia, COPD, ongoing tobacco use, presented to the emergency department with acute onset shortness of breath and peripheral edema.  EMS found him to be hypoxic at home with oxygen saturation in the mid 80s.  He was wheezing and coughing.  His baseline supplemental oxygen was increased to 3 L with some improvement in oxygen saturation to the mid 90s.  He was given 125 mg of Solu-Medrol  IV by EMS.  He says that he was recently started on Lasix  by his PCP and after starting it was having symptoms of dizziness.  Over the past 2 to 3 days he has had progressive shortness of breath wheezing coughing and weakness.  Patient reports shortness of breath with minimal exertion which is not his baseline.  He has some shortness of breath symptoms but no fever chills or chest pain.  His chest x-ray showed findings of a large right pleural effusion with compressive atelectasis of the right lung and possible pneumonia.  He had an elevated BNP.  White blood cell count 11.9.  Respiratory panel negative for influenza RSV and SARS coronavirus 2.  Hospital admission was requested for management. Patient was started on IV Solu-Medrol  and bronchodilators with gradual improvement.  He was weaned off oxygen.  He underwent thoracocentesis removing 1.5 L.  He was given a dose of IV furosemide  for his fluid overload.  Unfortunately, the patient developed atrial fibrillation with RVR on the evening of 01/29/2024.  He was placed on diltiazem  drip.  His rates remained elevated in the 140s.  Cardiology was consulted to assist with management.   Assessment/Plan: Acute respiratory failure with hypoxia -- Secondary to large right pleural effusion -- Secondary to acute COPD exacerbation and CHF -- thoracentesis completed 7/21 and 1.5 L serous fluid removed, fluid  sent for testing  -- added incentive spirometry 7/22 - initially on 2L>>RA   Acute COPD exacerbation -- continue IV solumedrol -change albuterol  to xopenex  -continue pulmicort  -continue brovana  -added yupelri  -- added flutter valve   Acute HFpEF -7/21 Echo EF 60-65%, no wMA, normal RVF, mod elevated PASP -s/p thora 7/21>>1.5 L removed>>transudative -remains clinically fluid overloaded -lasix  IV 40 x 1 on 7/23, 7/24, 7/25 -lasix  60 IV on 7/26 -check ReDS -daily weights -I/Os incomplete   Atrial fibrillation with RVR -started on diltiazem  drip 7/23 -HRs remain in 120-140s  -will need to tolerate mildly elevated HRs in setting of acute medical illness -cardiology consult appreciated -01/02/24 TSH 1.330 -CHADSVASc = 3--start apixaban  -transition to po diltiazem  7/25, started on amio IV 7/24 -given digoxin  x 3 on 7/24 - give mag IV 2g, keep Mag >2   Tobacco -- Nicotine  patch ordered for cravings --cessation discussed   Mixed Hyperlipidemia -continue statin         Family Communication:   spouse at bedside 7/26   Consultants:  none   Code Status:  FULL    DVT Prophylaxis:  apixaban      Procedures: As Listed in Progress Note Above   Antibiotics: Ceftriaxone  7/21>>7/25 Doxy 7/21>>7/25       Subjective:  Patient denies fevers, chills, headache, chest pain, dyspnea, nausea, vomiting, diarrhea, abdominal pain, dysuria, hematuria, hematochezia, and melena.  Objective: Vitals:   02/01/24 0912 02/01/24 0924 02/01/24 0931 02/01/24 1105  BP:      Pulse:  Resp:      Temp:    97.8 F (36.6 C)  TempSrc:    Oral  SpO2: 100% 100% 100%   Weight:      Height:        Intake/Output Summary (Last 24 hours) at 02/01/2024 1316 Last data filed at 02/01/2024 0311 Gross per 24 hour  Intake 382.2 ml  Output 230 ml  Net 152.2 ml   Weight change:  Exam:  General:  Pt is alert, follows commands appropriately, not in acute distress HEENT: No icterus, No  thrush, No neck mass, [place]/AT Cardiovascular: RRR, S1/S2, no rubs, no gallops Respiratory: bibasilar rales.  Bibasilar wheeze Abdomen: Soft/+BS, non tender, non distended, no guarding Extremities: 1 + LE edema, No lymphangitis, No petechiae, No rashes, no synovitis   Data Reviewed: I have personally reviewed following labs and imaging studies Basic Metabolic Panel: Recent Labs  Lab 01/28/24 0449 01/29/24 0444 01/30/24 0427 01/31/24 0420 02/01/24 0500  NA 140 139 139 136 136  K 3.8 4.4 3.9 3.9 4.0  CL 108 110 105 104 101  CO2 25 21* 25 23 26   GLUCOSE 133* 113* 151* 158* 154*  BUN 13 18 23  26* 24*  CREATININE 0.49* 0.47* 0.60* 0.73 0.69  CALCIUM  8.3* 8.1* 8.9 8.5* 8.5*  MG 1.8 2.3 2.0 1.9 1.8   Liver Function Tests: No results for input(s): AST, ALT, ALKPHOS, BILITOT, PROT, ALBUMIN in the last 168 hours. No results for input(s): LIPASE, AMYLASE in the last 168 hours. No results for input(s): AMMONIA in the last 168 hours. Coagulation Profile: No results for input(s): INR, PROTIME in the last 168 hours. CBC: Recent Labs  Lab 01/27/24 0858 01/28/24 0449 01/29/24 0444 01/30/24 0427  WBC 11.9* 9.5 19.3* 17.5*  NEUTROABS 10.2* 8.5* 17.3* 15.8*  HGB 14.7 13.3 12.3* 15.5  HCT 45.4 40.8 36.5* 46.7  MCV 97.0 97.1 93.6 94.5  PLT 275 249 PLATELET CLUMPS NOTED ON SMEAR, UNABLE TO ESTIMATE 334   Cardiac Enzymes: No results for input(s): CKTOTAL, CKMB, CKMBINDEX, TROPONINI in the last 168 hours. BNP: Invalid input(s): POCBNP CBG: No results for input(s): GLUCAP in the last 168 hours. HbA1C: No results for input(s): HGBA1C in the last 72 hours. Urine analysis:    Component Value Date/Time   APPEARANCEUR Cloudy (A) 05/19/2021 1459   GLUCOSEU Negative 05/19/2021 1459   BILIRUBINUR Negative 05/19/2021 1459   PROTEINUR 1+ (A) 05/19/2021 1459   NITRITE Negative 05/19/2021 1459   LEUKOCYTESUR Negative 05/19/2021 1459   Sepsis  Labs: @LABRCNTIP (procalcitonin:4,lacticidven:4) ) Recent Results (from the past 240 hours)  Resp panel by RT-PCR (RSV, Flu A&B, Covid) Anterior Nasal Swab     Status: None   Collection Time: 01/27/24  9:15 AM   Specimen: Anterior Nasal Swab  Result Value Ref Range Status   SARS Coronavirus 2 by RT PCR NEGATIVE NEGATIVE Final    Comment: (NOTE) SARS-CoV-2 target nucleic acids are NOT DETECTED.  The SARS-CoV-2 RNA is generally detectable in upper respiratory specimens during the acute phase of infection. The lowest concentration of SARS-CoV-2 viral copies this assay can detect is 138 copies/mL. A negative result does not preclude SARS-Cov-2 infection and should not be used as the sole basis for treatment or other patient management decisions. A negative result may occur with  improper specimen collection/handling, submission of specimen other than nasopharyngeal swab, presence of viral mutation(s) within the areas targeted by this assay, and inadequate number of viral copies(<138 copies/mL). A negative result must be combined with clinical observations,  patient history, and epidemiological information. The expected result is Negative.  Fact Sheet for Patients:  BloggerCourse.com  Fact Sheet for Healthcare Providers:  SeriousBroker.it  This test is no t yet approved or cleared by the [place]  FDA and  has been authorized for detection and/or diagnosis of SARS-CoV-2 by FDA under an Emergency Use Authorization (EUA). This EUA will remain  in effect (meaning this test can be used) for the duration of the COVID-19 declaration under Section 564(b)(1) of the Act, 21 U.S.C.section 360bbb-3(b)(1), unless the authorization is terminated  or revoked sooner.       Influenza A by PCR NEGATIVE NEGATIVE Final   Influenza B by PCR NEGATIVE NEGATIVE Final    Comment: (NOTE) The Xpert Xpress SARS-CoV-2/FLU/RSV plus assay is intended as an  aid in the diagnosis of influenza from Nasopharyngeal swab specimens and should not be used as a sole basis for treatment. Nasal washings and aspirates are unacceptable for Xpert Xpress SARS-CoV-2/FLU/RSV testing.  Fact Sheet for Patients: BloggerCourse.com  Fact Sheet for Healthcare Providers: SeriousBroker.it  This test is not yet approved or cleared by the [place]  FDA and has been authorized for detection and/or diagnosis of SARS-CoV-2 by FDA under an Emergency Use Authorization (EUA). This EUA will remain in effect (meaning this test can be used) for the duration of the COVID-19 declaration under Section 564(b)(1) of the Act, 21 U.S.C. section 360bbb-3(b)(1), unless the authorization is terminated or revoked.     Resp Syncytial Virus by PCR NEGATIVE NEGATIVE Final    Comment: (NOTE) Fact Sheet for Patients: BloggerCourse.com  Fact Sheet for Healthcare Providers: SeriousBroker.it  This test is not yet approved or cleared by the [place]  FDA and has been authorized for detection and/or diagnosis of SARS-CoV-2 by FDA under an Emergency Use Authorization (EUA). This EUA will remain in effect (meaning this test can be used) for the duration of the COVID-19 declaration under Section 564(b)(1) of the Act, 21 U.S.C. section 360bbb-3(b)(1), unless the authorization is terminated or revoked.  Performed at [facility], [address] [place], [place] [address]   Culture, blood (Routine X 2) w Reflex to ID Panel     Status: None   Collection Time: 01/27/24 10:39 AM   Specimen: BLOOD  Result Value Ref Range Status   Specimen Description BLOOD BLOOD RIGHT ARM  Final   Special Requests   Final    BOTTLES DRAWN AEROBIC AND ANAEROBIC Blood Culture adequate volume   Culture   Final    NO GROWTH 5 DAYS Performed at [facility], [address], [place], [place] [address]     Report Status 02/01/2024 FINAL  Final  Culture, blood (Routine X 2) w Reflex to ID Panel     Status: None   Collection Time: 01/27/24 10:45 AM   Specimen: BLOOD  Result Value Ref Range Status   Specimen Description BLOOD BLOOD LEFT ARM  Final   Special Requests   Final    BOTTLES DRAWN AEROBIC ONLY Blood Culture results may not be optimal due to an inadequate volume of blood received in culture bottles   Culture   Final    NO GROWTH 5 DAYS Performed at [facility], [address], [place], [place] [address]    Report Status 02/01/2024 FINAL  Final  MRSA Next Gen by PCR, Nasal     Status: None   Collection Time: 01/29/24  7:00 PM   Specimen: Nasal Mucosa; Nasal Swab  Result Value Ref Range Status  MRSA by PCR Next Gen NOT DETECTED NOT DETECTED Final    Comment: (NOTE) The GeneXpert MRSA Assay (FDA approved for NASAL specimens only), is one component of a comprehensive MRSA colonization surveillance program. It is not intended to diagnose MRSA infection nor to guide or monitor treatment for MRSA infections. Test performance is not FDA approved in patients less than 66 years old. Performed at [facility], [address] [place], [place] [address]      Scheduled Meds:  apixaban   5 mg Oral BID   arformoterol   15 mcg Nebulization BID   budesonide  (PULMICORT ) nebulizer solution  0.5 mg Nebulization BID   Chlorhexidine  Gluconate Cloth  6 each Topical Q0600   clotrimazole   10 mg Oral 5 X Daily   diltiazem   90 mg Oral Q6H   fluticasone   2 spray Each Nare Daily   furosemide   40 mg Intravenous Once   guaiFENesin   15 mL Oral QID   levalbuterol   0.63 mg Nebulization Q8H   loratadine   10 mg Oral Daily   methylPREDNISolone  (SOLU-MEDROL ) injection  60 mg Intravenous BID   nicotine   14 mg Transdermal Daily   polyethylene glycol  17 g Oral Daily   revefenacin   175 mcg Nebulization Daily   rosuvastatin   5 mg Oral Daily   Continuous Infusions:  amiodarone  30 mg/hr (02/01/24 1144)    magnesium  sulfate bolus IVPB      Procedures/Studies: CT CHEST WO CONTRAST Result Date: 01/30/2024 CLINICAL DATA:  Rhonchi and wheezing.  Decreased breath sounds. EXAM: CT CHEST WITHOUT CONTRAST TECHNIQUE: Multidetector CT imaging of the chest was performed following the standard protocol without IV contrast. RADIATION DOSE REDUCTION: This exam was performed according to the departmental dose-optimization program which includes automated exposure control, adjustment of the mA and/or kV according to patient size and/or use of iterative reconstruction technique. COMPARISON:  January 28, 2024. FINDINGS: Cardiovascular: Atherosclerosis of thoracic aorta is noted without aneurysm formation. Normal cardiac size. No pericardial effusion. Coronary artery calcifications are noted. Mediastinum/Nodes: No enlarged mediastinal or axillary lymph nodes. Thyroid  gland, trachea, and esophagus demonstrate no significant findings. Lungs/Pleura: Mild to moderate right pleural effusion is noted. Small left pleural effusion is noted. No pneumothorax is noted. Emphysematous disease is noted. Right lower lobe and middle lobe opacities are noted concerning for pneumonia or atelectasis. Upper Abdomen: Possible nodular hepatic contours are noted suggesting cirrhosis. Musculoskeletal: No chest wall mass or suspicious bone lesions identified. IMPRESSION: Bilateral pleural effusions as noted above, right greater than left. Right lower lobe and middle lobe opacities are noted concerning for pneumonia or atelectasis. Coronary artery calcifications are noted. 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 Aortic Atherosclerosis (ICD10-I70.0) and Emphysema (ICD10-J43.9). Electronically Signed   By: Lynwood Landy Raddle M.D.   On: 01/30/2024 16:08   Portable chest 1 View Result Date: 01/28/2024 CLINICAL DATA:  Pleural effusion. EXAM: PORTABLE CHEST 1 VIEW COMPARISON:  01/27/2024 FINDINGS: Right base collapse/consolidation with effusion is similar to prior. Left lung is  clear. Interstitial markings are diffusely coarsened with chronic features. Cardiopericardial silhouette is at upper limits of normal for size. No acute bony abnormality. IMPRESSION: Right base collapse/consolidation with effusion, similar to prior. Electronically Signed   By: Camellia Candle M.D.   On: 01/28/2024 06:37   ECHOCARDIOGRAM COMPLETE Result Date: 01/27/2024    ECHOCARDIOGRAM REPORT   Patient Name:   [facility] Loralye Loberg Irizarry Date of Exam: 01/27/2024 Medical Rec #:  [id number]           Height:  68.0 in Accession #:    [id number]          Weight:       135.4 lb Date of Birth:  16-Jul-1950           BSA:          1.731 m Patient Age:    73 years            BP:           159/72 mmHg Patient Gender: M                   HR:           110 bpm. Exam Location:  [facility] Procedure: 2D Echo, Cardiac Doppler and Color Doppler (Both Spectral and Color            Flow Doppler were utilized during procedure). Indications:    dyspnea  History:        Patient has no prior history of Echocardiogram examinations.                 COPD; Risk Factors:Current Smoker.  Sonographer:    Benard Stallion Referring Phys: [phone] CLANFORD L JOHNSON IMPRESSIONS  1. Left ventricular ejection fraction, by estimation, is 60 to 65%. The left ventricle has normal function. The left ventricle has no regional wall motion abnormalities. Left ventricular diastolic function could not be evaluated.  2. Right ventricular systolic function is normal. The right ventricular size is normal. There is moderately elevated pulmonary artery systolic pressure.  3. The mitral valve is normal in structure. Trivial mitral valve regurgitation. No evidence of mitral stenosis.  4. The aortic valve is normal in structure. Aortic valve regurgitation is not visualized. No aortic stenosis is present.  5. The inferior vena cava is normal in size with greater than 50% respiratory variability, suggesting right atrial pressure of 3 mmHg. FINDINGS  Left Ventricle: Left  ventricular ejection fraction, by estimation, is 60 to 65%. The left ventricle has normal function. The left ventricle has no regional wall motion abnormalities. The left ventricular internal cavity size was normal in size. There is  no left ventricular hypertrophy. Left ventricular diastolic function could not be evaluated. Right Ventricle: The right ventricular size is normal. No increase in right ventricular wall thickness. Right ventricular systolic function is normal. There is moderately elevated pulmonary artery systolic pressure. The tricuspid regurgitant velocity is 3.30 m/s, and with an assumed right atrial pressure of 3 mmHg, the estimated right ventricular systolic pressure is 46.6 mmHg. Left Atrium: Left atrial size was normal in size. Right Atrium: Right atrial size was normal in size. Pericardium: There is no evidence of pericardial effusion. Mitral Valve: The mitral valve is normal in structure. Trivial mitral valve regurgitation. No evidence of mitral valve stenosis. Tricuspid Valve: The tricuspid valve is normal in structure. Tricuspid valve regurgitation is mild . No evidence of tricuspid stenosis. Aortic Valve: The aortic valve is normal in structure. Aortic valve regurgitation is not visualized. No aortic stenosis is present. Aortic valve mean gradient measures 3.0 mmHg. Aortic valve peak gradient measures 6.5 mmHg. Aortic valve area, by VTI measures 3.31 cm. Pulmonic Valve: The pulmonic valve was normal in structure. Pulmonic valve regurgitation is not visualized. No evidence of pulmonic stenosis. Aorta: The aortic root is normal in size and structure. Venous: The inferior vena cava is normal in size with greater than 50% respiratory variability, suggesting right atrial pressure of 3 mmHg. IAS/Shunts: No atrial level shunt  detected by color flow Doppler.  LEFT VENTRICLE PLAX 2D LVIDd:         4.95 cm   Diastology LVIDs:         3.70 cm   LV e' medial:  7.51 cm/s LV PW:         0.85 cm   LV e'  lateral: 9.25 cm/s LV IVS:        0.85 cm LVOT diam:     2.20 cm LV SV:         65 LV SV Index:   37 LVOT Area:     3.80 cm  RIGHT VENTRICLE RV Basal diam:  3.25 cm RV Mid diam:    3.15 cm RV S prime:     18.60 cm/s TAPSE (M-mode): 2.4 cm LEFT ATRIUM           Index        RIGHT ATRIUM           Index LA Vol (A4C): 67.0 ml 38.70 ml/m  RA Area:     12.30 cm                                    RA Volume:   32.30 ml  18.66 ml/m  AORTIC VALVE AV Area (Vmax):    3.35 cm AV Area (Vmean):   3.30 cm AV Area (VTI):     3.31 cm AV Vmax:           127.00 cm/s AV Vmean:          83.400 cm/s AV VTI:            0.195 m AV Peak Grad:      6.5 mmHg AV Mean Grad:      3.0 mmHg LVOT Vmax:         112.00 cm/s LVOT Vmean:        72.300 cm/s LVOT VTI:          0.170 m LVOT/AV VTI ratio: 0.87  AORTA Ao Root diam: 3.20 cm TRICUSPID VALVE TR Peak grad:   43.6 mmHg TR Vmax:        330.00 cm/s  SHUNTS Systemic VTI:  0.17 m Systemic Diam: 2.20 cm Wilbert Bihari MD Electronically signed by Wilbert Bihari MD Signature Date/Time: 01/27/2024/6:41:20 PM    Final    US  THORACENTESIS ASP PLEURAL SPACE W/IMG GUIDE Result Date: 01/27/2024 INDICATION: Patient with history of COPD admitted with dyspnea and found to have large right pleural effusion. Request for diagnostic and therapeutic right thoracentesis. EXAM: ULTRASOUND GUIDED RIGHT THORACENTESIS MEDICATIONS: 6 mL 1% lidocaine  COMPLICATIONS: None immediate. PROCEDURE: An ultrasound guided thoracentesis was thoroughly discussed with the patient and questions answered. The benefits, risks, alternatives and complications were also discussed. The patient understands and wishes to proceed with the procedure. Written consent was obtained. Ultrasound was performed to localize and mark an adequate pocket of fluid in the right chest. The area was then prepped and draped in the normal sterile fashion. 1% Lidocaine  was used for local anesthesia. Under ultrasound guidance a 6 Fr Safe-T-Centesis catheter  was introduced. Thoracentesis was performed. The catheter was removed and a dressing applied. FINDINGS: A total of approximately 1.5 L of clear yellow fluid was removed. Samples were sent to the laboratory as requested by the clinical team. IMPRESSION: Successful ultrasound guided right thoracentesis yielding 1.5 L of pleural fluid. Performed by Clotilda Hesselbach, PA-C  Electronically Signed   By: Ester Sides M.D.   On: 01/27/2024 12:55   DG Chest 1 View Result Date: 01/27/2024 CLINICAL DATA:  Post thoracentesis. EXAM: CHEST  1 VIEW COMPARISON:  Radiographs 01/27/2024 and 02/15/2020. FINDINGS: 1129 hours. Interval decreased right pleural effusion with improved aeration of the right lung base. No definite pneumothorax. The heart size and mediastinal contours are stable with mild mediastinal shift to the right. The left lung appears clear. The bones appear unremarkable. IMPRESSION: Interval decreased right pleural effusion with improved aeration of the right lung base but incomplete re-expansion of the right lung. No definite pneumothorax. Electronically Signed   By: Elsie Perone M.D.   On: 01/27/2024 11:59   DG Chest Port 1 View Result Date: 01/27/2024 CLINICAL DATA:  Shortness of breath, wheezing, low O2 sats. EXAM: PORTABLE CHEST 1 VIEW COMPARISON:  02/15/2020. FINDINGS: Trachea is midline. Heart size stable. Large right pleural effusion with right basilar collapse/consolidation. Streaky atelectasis in the left lung base. IMPRESSION: Large right pleural effusion with collapse/consolidation in the right lung base, possibly due to pneumonia. Difficult exclude underlying malignancy. Followup PA and lateral chest X-ray is recommended in 3-4 weeks following trial of antibiotic therapy to ensure resolution and exclude underlying malignancy. Electronically Signed   By: Newell Eke M.D.   On: 01/27/2024 09:25    Alm Schneider, DO  [facility]  If 7PM-7AM, please contact  night-coverage [email or web address] Password [email or web address] 02/01/2024, 1:16 PM   LOS: 5 days

## 2024-02-02 DIAGNOSIS — Z720 Tobacco use: Secondary | ICD-10-CM | POA: Diagnosis not present

## 2024-02-02 DIAGNOSIS — J441 Chronic obstructive pulmonary disease with (acute) exacerbation: Secondary | ICD-10-CM | POA: Diagnosis not present

## 2024-02-02 DIAGNOSIS — I5031 Acute diastolic (congestive) heart failure: Secondary | ICD-10-CM | POA: Diagnosis not present

## 2024-02-02 LAB — BASIC METABOLIC PANEL WITH GFR
Anion gap: 9 (ref 5–15)
BUN: 23 mg/dL (ref 8–23)
CO2: 31 mmol/L (ref 22–32)
Calcium: 8.3 mg/dL — ABNORMAL LOW (ref 8.9–10.3)
Chloride: 95 mmol/L — ABNORMAL LOW (ref 98–111)
Creatinine, Ser: 0.75 mg/dL (ref 0.61–1.24)
GFR, Estimated: >60 mL/min (ref >60)
Glucose, Bld: 158 mg/dL — ABNORMAL HIGH (ref 70–99)
Potassium: 4.2 mmol/L (ref 3.5–5.1)
Sodium: 135 mmol/L (ref 135–145)

## 2024-02-02 LAB — MAGNESIUM: Magnesium: 2.1 mg/dL (ref 1.7–2.4)

## 2024-02-02 LAB — PHOSPHORUS: Phosphorus: 2.9 mg/dL (ref 2.5–4.6)

## 2024-02-02 MED ORDER — FUROSEMIDE 10 MG/ML IJ SOLN: 40.0000 mg | Freq: Once | INTRAMUSCULAR | Status: DC

## 2024-02-02 MED ORDER — FUROSEMIDE 10 MG/ML IJ SOLN
60.0000 mg | Freq: Once | INTRAMUSCULAR | Status: AC
  Administered 2024-02-02: 60 mg via INTRAVENOUS
  Filled 2024-02-02: qty 6

## 2024-02-02 NOTE — Plan of Care (Signed)

## 2024-02-02 NOTE — Plan of Care (Signed)

## 2024-02-02 NOTE — Progress Notes (Signed)
 PROGRESS NOTE  Victor Shields FMW:[id number] DOB: 1951-03-06 DOA: 01/27/2024 PCP: Lavell Bari LABOR, FNP  Brief History:  73 year old male with hypertension, hyperlipidemia, COPD, ongoing tobacco use, presented to the emergency department with acute onset shortness of breath and peripheral edema.  EMS found him to be hypoxic at home with oxygen saturation in the mid 80s.  He was wheezing and coughing.  His baseline supplemental oxygen was increased to 3 L with some improvement in oxygen saturation to the mid 90s.  He was given 125 mg of Solu-Medrol  IV by EMS.  He says that he was recently started on Lasix  by his PCP and after starting it was having symptoms of dizziness.  Over the past 2 to 3 days he has had progressive shortness of breath wheezing coughing and weakness.  Patient reports shortness of breath with minimal exertion which is not his baseline.  He has some shortness of breath symptoms but no fever chills or chest pain.  His chest x-ray showed findings of a large right pleural effusion with compressive atelectasis of the right lung and possible pneumonia.  He had an elevated BNP.  White blood cell count 11.9.  Respiratory panel negative for influenza RSV and SARS coronavirus 2.  Hospital admission was requested for management. Patient was started on IV Solu-Medrol  and bronchodilators with gradual improvement.  He was weaned off oxygen.  He underwent thoracocentesis removing 1.5 L.  He was given a dose of IV furosemide  for his fluid overload.  Unfortunately, the patient developed atrial fibrillation with RVR on the evening of 01/29/2024.  He was placed on diltiazem  drip.  His rates remained elevated in the 140s.  Cardiology was consulted to assist with management.   Assessment/Plan: Acute respiratory failure with hypoxia -- Secondary to large right pleural effusion -- Secondary to acute COPD exacerbation and CHF -- thoracentesis completed 7/21 and 1.5 L serous fluid removed, fluid  sent for testing  -- added incentive spirometry 7/22 - initially on 2L>>RA - repeat CXR am 7/28   Acute COPD exacerbation -- continue IV solumedrol -change albuterol  to xopenex  -continue pulmicort  -continue brovana  -added yupelri  -- added flutter valve   Acute HFpEF -7/21 Echo EF 60-65%, no wMA, normal RVF, mod elevated PASP -s/p thora 7/21>>1.5 L removed>>transudative -remains clinically fluid overloaded -lasix  IV 40 x 1 on 7/23, 7/24, 7/25 -lasix  60 IV on 7/26 and 7/27 -check ReDS -daily weights -I/Os incomplete   Atrial fibrillation with RVR -started on diltiazem  drip 7/23>>po diltiazem  7/25 -started amio drip on 7/24 -will need to tolerate mildly elevated HRs in setting of acute medical illness -cardiology consult appreciated -01/02/24 TSH 1.330 -CHADSVASc = 3--start apixaban  -given digoxin  x 3 on 7/24 - keep Mag >2 and K >4 - converted to sinus 7/27 at 0226   Tobacco -- Nicotine  patch ordered for cravings --cessation discussed   Mixed Hyperlipidemia -continue statin         Family Communication:   spouse at bedside 7/27   Consultants:  none   Code Status:  FULL    DVT Prophylaxis:  apixaban      Procedures: As Listed in Progress Note Above   Antibiotics: Ceftriaxone  7/21>>7/25 Doxy 7/21>>7/25        Subjective: Patient denies fevers, chills, headache, chest pain, dyspnea, nausea, vomiting, diarrhea, abdominal pain, dysuria, hematuria, hematochezia, and melena.   Objective: Vitals:   02/02/24 0900 02/02/24 1000 02/02/24 1100 02/02/24 1126  BP: (!) 149/47 (!) 146/47 ROLLEN)  158/37   Pulse: 70 84 69   Resp: 11 20 10    Temp:    98.3 F (36.8 C)  TempSrc:    Axillary  SpO2: 100% 99% 96%   Weight:      Height:        Intake/Output Summary (Last 24 hours) at 02/02/2024 1140 Last data filed at 02/02/2024 1051 Gross per 24 hour  Intake 574.03 ml  Output 300 ml  Net 274.03 ml   Weight change:  Exam:  General:  Pt is alert, follows  commands appropriately, not in acute distress HEENT: No icterus, No thrush, No neck mass, [place]/AT Cardiovascular: RRR, S1/S2, no rubs, no gallops Respiratory: bibasilar rales.  Bilateral exp wheeze Abdomen: Soft/+BS, non tender, non distended, no guarding Extremities: 1 + LE edema, No lymphangitis, No petechiae, No rashes, no synovitis   Data Reviewed: I have personally reviewed following labs and imaging studies Basic Metabolic Panel: Recent Labs  Lab 01/29/24 0444 01/30/24 0427 01/31/24 0420 02/01/24 0500 02/02/24 0606  NA 139 139 136 136 135  K 4.4 3.9 3.9 4.0 4.2  CL 110 105 104 101 95*  CO2 21* 25 23 26 31   GLUCOSE 113* 151* 158* 154* 158*  BUN 18 23 26* 24* 23  CREATININE 0.47* 0.60* 0.73 0.69 0.75  CALCIUM  8.1* 8.9 8.5* 8.5* 8.3*  MG 2.3 2.0 1.9 1.8 2.1  PHOS  --   --   --   --  2.9   Liver Function Tests: No results for input(s): AST, ALT, ALKPHOS, BILITOT, PROT, ALBUMIN in the last 168 hours. No results for input(s): LIPASE, AMYLASE in the last 168 hours. No results for input(s): AMMONIA in the last 168 hours. Coagulation Profile: No results for input(s): INR, PROTIME in the last 168 hours. CBC: Recent Labs  Lab 01/27/24 0858 01/28/24 0449 01/29/24 0444 01/30/24 0427  WBC 11.9* 9.5 19.3* 17.5*  NEUTROABS 10.2* 8.5* 17.3* 15.8*  HGB 14.7 13.3 12.3* 15.5  HCT 45.4 40.8 36.5* 46.7  MCV 97.0 97.1 93.6 94.5  PLT 275 249 PLATELET CLUMPS NOTED ON SMEAR, UNABLE TO ESTIMATE 334   Cardiac Enzymes: No results for input(s): CKTOTAL, CKMB, CKMBINDEX, TROPONINI in the last 168 hours. BNP: Invalid input(s): POCBNP CBG: No results for input(s): GLUCAP in the last 168 hours. HbA1C: No results for input(s): HGBA1C in the last 72 hours. Urine analysis:    Component Value Date/Time   APPEARANCEUR Cloudy (A) 05/19/2021 1459   GLUCOSEU Negative 05/19/2021 1459   BILIRUBINUR Negative 05/19/2021 1459   PROTEINUR 1+ (A) 05/19/2021 1459    NITRITE Negative 05/19/2021 1459   LEUKOCYTESUR Negative 05/19/2021 1459   Sepsis Labs: @LABRCNTIP (procalcitonin:4,lacticidven:4) ) Recent Results (from the past 240 hours)  Resp panel by RT-PCR (RSV, Flu A&B, Covid) Anterior Nasal Swab     Status: None   Collection Time: 01/27/24  9:15 AM   Specimen: Anterior Nasal Swab  Result Value Ref Range Status   SARS Coronavirus 2 by RT PCR NEGATIVE NEGATIVE Final    Comment: (NOTE) SARS-CoV-2 target nucleic acids are NOT DETECTED.  The SARS-CoV-2 RNA is generally detectable in upper respiratory specimens during the acute phase of infection. The lowest concentration of SARS-CoV-2 viral copies this assay can detect is 138 copies/mL. A negative result does not preclude SARS-Cov-2 infection and should not be used as the sole basis for treatment or other patient management decisions. A negative result may occur with  improper specimen collection/handling, submission of specimen other than nasopharyngeal swab, presence of  viral mutation(s) within the areas targeted by this assay, and inadequate number of viral copies(<138 copies/mL). A negative result must be combined with clinical observations, patient history, and epidemiological information. The expected result is Negative.  Fact Sheet for Patients:  BloggerCourse.com  Fact Sheet for Healthcare Providers:  SeriousBroker.it  This test is no t yet approved or cleared by the [place]  FDA and  has been authorized for detection and/or diagnosis of SARS-CoV-2 by FDA under an Emergency Use Authorization (EUA). This EUA will remain  in effect (meaning this test can be used) for the duration of the COVID-19 declaration under Section 564(b)(1) of the Act, 21 U.S.C.section 360bbb-3(b)(1), unless the authorization is terminated  or revoked sooner.       Influenza A by PCR NEGATIVE NEGATIVE Final   Influenza B by PCR NEGATIVE NEGATIVE  Final    Comment: (NOTE) The Xpert Xpress SARS-CoV-2/FLU/RSV plus assay is intended as an aid in the diagnosis of influenza from Nasopharyngeal swab specimens and should not be used as a sole basis for treatment. Nasal washings and aspirates are unacceptable for Xpert Xpress SARS-CoV-2/FLU/RSV testing.  Fact Sheet for Patients: BloggerCourse.com  Fact Sheet for Healthcare Providers: SeriousBroker.it  This test is not yet approved or cleared by the [place]  FDA and has been authorized for detection and/or diagnosis of SARS-CoV-2 by FDA under an Emergency Use Authorization (EUA). This EUA will remain in effect (meaning this test can be used) for the duration of the COVID-19 declaration under Section 564(b)(1) of the Act, 21 U.S.C. section 360bbb-3(b)(1), unless the authorization is terminated or revoked.     Resp Syncytial Virus by PCR NEGATIVE NEGATIVE Final    Comment: (NOTE) Fact Sheet for Patients: BloggerCourse.com  Fact Sheet for Healthcare Providers: SeriousBroker.it  This test is not yet approved or cleared by the [place]  FDA and has been authorized for detection and/or diagnosis of SARS-CoV-2 by FDA under an Emergency Use Authorization (EUA). This EUA will remain in effect (meaning this test can be used) for the duration of the COVID-19 declaration under Section 564(b)(1) of the Act, 21 U.S.C. section 360bbb-3(b)(1), unless the authorization is terminated or revoked.  Performed at [facility], [address] [place], [place] [address]   Culture, blood (Routine X 2) w Reflex to ID Panel     Status: None   Collection Time: 01/27/24 10:39 AM   Specimen: BLOOD  Result Value Ref Range Status   Specimen Description BLOOD BLOOD RIGHT ARM  Final   Special Requests   Final    BOTTLES DRAWN AEROBIC AND ANAEROBIC Blood Culture adequate volume   Culture   Final     NO GROWTH 5 DAYS Performed at [facility], [address] [place], [place] [address]    Report Status 02/01/2024 FINAL  Final  Culture, blood (Routine X 2) w Reflex to ID Panel     Status: None   Collection Time: 01/27/24 10:45 AM   Specimen: BLOOD  Result Value Ref Range Status   Specimen Description BLOOD BLOOD LEFT ARM  Final   Special Requests   Final    BOTTLES DRAWN AEROBIC ONLY Blood Culture results may not be optimal due to an inadequate volume of blood received in culture bottles   Culture   Final    NO GROWTH 5 DAYS Performed at [facility], [address], [place], [place] [address]    Report Status 02/01/2024 FINAL  Final  MRSA Next Gen by PCR, Nasal  Status: None   Collection Time: 01/29/24  7:00 PM   Specimen: Nasal Mucosa; Nasal Swab  Result Value Ref Range Status   MRSA by PCR Next Gen NOT DETECTED NOT DETECTED Final    Comment: (NOTE) The GeneXpert MRSA Assay (FDA approved for NASAL specimens only), is one component of a comprehensive MRSA colonization surveillance program. It is not intended to diagnose MRSA infection nor to guide or monitor treatment for MRSA infections. Test performance is not FDA approved in patients less than 87 years old. Performed at [facility], [address], [place], [place] [address]      Scheduled Meds:  apixaban   5 mg Oral BID   arformoterol   15 mcg Nebulization BID   budesonide  (PULMICORT ) nebulizer solution  0.5 mg Nebulization BID   Chlorhexidine  Gluconate Cloth  6 each Topical Q0600   clotrimazole   10 mg Oral 5 X Daily   diltiazem   90 mg Oral Q6H   fluticasone   2 spray Each Nare Daily   furosemide   60 mg Intravenous Once   guaiFENesin   15 mL Oral QID   levalbuterol   0.63 mg Nebulization TID   loratadine   10 mg Oral Daily   methylPREDNISolone  (SOLU-MEDROL ) injection  60 mg Intravenous BID   nicotine   14 mg Transdermal Daily   polyethylene glycol  17 g Oral Daily   revefenacin   175 mcg Nebulization Daily    rosuvastatin   5 mg Oral Daily   Continuous Infusions:  amiodarone  30 mg/hr (02/02/24 1051)    Procedures/Studies: CT CHEST WO CONTRAST Result Date: 01/30/2024 CLINICAL DATA:  Rhonchi and wheezing.  Decreased breath sounds. EXAM: CT CHEST WITHOUT CONTRAST TECHNIQUE: Multidetector CT imaging of the chest was performed following the standard protocol without IV contrast. RADIATION DOSE REDUCTION: This exam was performed according to the departmental dose-optimization program which includes automated exposure control, adjustment of the mA and/or kV according to patient size and/or use of iterative reconstruction technique. COMPARISON:  January 28, 2024. FINDINGS: Cardiovascular: Atherosclerosis of thoracic aorta is noted without aneurysm formation. Normal cardiac size. No pericardial effusion. Coronary artery calcifications are noted. Mediastinum/Nodes: No enlarged mediastinal or axillary lymph nodes. Thyroid  gland, trachea, and esophagus demonstrate no significant findings. Lungs/Pleura: Mild to moderate right pleural effusion is noted. Small left pleural effusion is noted. No pneumothorax is noted. Emphysematous disease is noted. Right lower lobe and middle lobe opacities are noted concerning for pneumonia or atelectasis. Upper Abdomen: Possible nodular hepatic contours are noted suggesting cirrhosis. Musculoskeletal: No chest wall mass or suspicious bone lesions identified. IMPRESSION: Bilateral pleural effusions as noted above, right greater than left. Right lower lobe and middle lobe opacities are noted concerning for pneumonia or atelectasis. Coronary artery calcifications are noted. 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 Aortic Atherosclerosis (ICD10-I70.0) and Emphysema (ICD10-J43.9). Electronically Signed   By: Lynwood Landy Raddle M.D.   On: 01/30/2024 16:08   Portable chest 1 View Result Date: 01/28/2024 CLINICAL DATA:  Pleural effusion. EXAM: PORTABLE CHEST 1 VIEW COMPARISON:  01/27/2024 FINDINGS: Right base  collapse/consolidation with effusion is similar to prior. Left lung is clear. Interstitial markings are diffusely coarsened with chronic features. Cardiopericardial silhouette is at upper limits of normal for size. No acute bony abnormality. IMPRESSION: Right base collapse/consolidation with effusion, similar to prior. Electronically Signed   By: Camellia Candle M.D.   On: 01/28/2024 06:37   ECHOCARDIOGRAM COMPLETE Result Date: 01/27/2024    ECHOCARDIOGRAM REPORT   Patient Name:   [facility] Atziri Zubiate Livingston Date of Exam: 01/27/2024 Medical Rec #:  [id number]  Height:       68.0 in Accession #:    [id number]          Weight:       135.4 lb Date of Birth:  06-May-1951           BSA:          1.731 m Patient Age:    73 years            BP:           159/72 mmHg Patient Gender: M                   HR:           110 bpm. Exam Location:  [facility] Procedure: 2D Echo, Cardiac Doppler and Color Doppler (Both Spectral and Color            Flow Doppler were utilized during procedure). Indications:    dyspnea  History:        Patient has no prior history of Echocardiogram examinations.                 COPD; Risk Factors:Current Smoker.  Sonographer:    Benard Stallion Referring Phys: [phone] CLANFORD L JOHNSON IMPRESSIONS  1. Left ventricular ejection fraction, by estimation, is 60 to 65%. The left ventricle has normal function. The left ventricle has no regional wall motion abnormalities. Left ventricular diastolic function could not be evaluated.  2. Right ventricular systolic function is normal. The right ventricular size is normal. There is moderately elevated pulmonary artery systolic pressure.  3. The mitral valve is normal in structure. Trivial mitral valve regurgitation. No evidence of mitral stenosis.  4. The aortic valve is normal in structure. Aortic valve regurgitation is not visualized. No aortic stenosis is present.  5. The inferior vena cava is normal in size with greater than 50% respiratory variability,  suggesting right atrial pressure of 3 mmHg. FINDINGS  Left Ventricle: Left ventricular ejection fraction, by estimation, is 60 to 65%. The left ventricle has normal function. The left ventricle has no regional wall motion abnormalities. The left ventricular internal cavity size was normal in size. There is  no left ventricular hypertrophy. Left ventricular diastolic function could not be evaluated. Right Ventricle: The right ventricular size is normal. No increase in right ventricular wall thickness. Right ventricular systolic function is normal. There is moderately elevated pulmonary artery systolic pressure. The tricuspid regurgitant velocity is 3.30 m/s, and with an assumed right atrial pressure of 3 mmHg, the estimated right ventricular systolic pressure is 46.6 mmHg. Left Atrium: Left atrial size was normal in size. Right Atrium: Right atrial size was normal in size. Pericardium: There is no evidence of pericardial effusion. Mitral Valve: The mitral valve is normal in structure. Trivial mitral valve regurgitation. No evidence of mitral valve stenosis. Tricuspid Valve: The tricuspid valve is normal in structure. Tricuspid valve regurgitation is mild . No evidence of tricuspid stenosis. Aortic Valve: The aortic valve is normal in structure. Aortic valve regurgitation is not visualized. No aortic stenosis is present. Aortic valve mean gradient measures 3.0 mmHg. Aortic valve peak gradient measures 6.5 mmHg. Aortic valve area, by VTI measures 3.31 cm. Pulmonic Valve: The pulmonic valve was normal in structure. Pulmonic valve regurgitation is not visualized. No evidence of pulmonic stenosis. Aorta: The aortic root is normal in size and structure. Venous: The inferior vena cava is normal in size with greater than 50% respiratory variability, suggesting right atrial pressure of  3 mmHg. IAS/Shunts: No atrial level shunt detected by color flow Doppler.  LEFT VENTRICLE PLAX 2D LVIDd:         4.95 cm   Diastology LVIDs:          3.70 cm   LV e' medial:  7.51 cm/s LV PW:         0.85 cm   LV e' lateral: 9.25 cm/s LV IVS:        0.85 cm LVOT diam:     2.20 cm LV SV:         65 LV SV Index:   37 LVOT Area:     3.80 cm  RIGHT VENTRICLE RV Basal diam:  3.25 cm RV Mid diam:    3.15 cm RV S prime:     18.60 cm/s TAPSE (M-mode): 2.4 cm LEFT ATRIUM           Index        RIGHT ATRIUM           Index LA Vol (A4C): 67.0 ml 38.70 ml/m  RA Area:     12.30 cm                                    RA Volume:   32.30 ml  18.66 ml/m  AORTIC VALVE AV Area (Vmax):    3.35 cm AV Area (Vmean):   3.30 cm AV Area (VTI):     3.31 cm AV Vmax:           127.00 cm/s AV Vmean:          83.400 cm/s AV VTI:            0.195 m AV Peak Grad:      6.5 mmHg AV Mean Grad:      3.0 mmHg LVOT Vmax:         112.00 cm/s LVOT Vmean:        72.300 cm/s LVOT VTI:          0.170 m LVOT/AV VTI ratio: 0.87  AORTA Ao Root diam: 3.20 cm TRICUSPID VALVE TR Peak grad:   43.6 mmHg TR Vmax:        330.00 cm/s  SHUNTS Systemic VTI:  0.17 m Systemic Diam: 2.20 cm Wilbert Bihari MD Electronically signed by Wilbert Bihari MD Signature Date/Time: 01/27/2024/6:41:20 PM    Final    US  THORACENTESIS ASP PLEURAL SPACE W/IMG GUIDE Result Date: 01/27/2024 INDICATION: Patient with history of COPD admitted with dyspnea and found to have large right pleural effusion. Request for diagnostic and therapeutic right thoracentesis. EXAM: ULTRASOUND GUIDED RIGHT THORACENTESIS MEDICATIONS: 6 mL 1% lidocaine  COMPLICATIONS: None immediate. PROCEDURE: An ultrasound guided thoracentesis was thoroughly discussed with the patient and questions answered. The benefits, risks, alternatives and complications were also discussed. The patient understands and wishes to proceed with the procedure. Written consent was obtained. Ultrasound was performed to localize and mark an adequate pocket of fluid in the right chest. The area was then prepped and draped in the normal sterile fashion. 1% Lidocaine  was used for local  anesthesia. Under ultrasound guidance a 6 Fr Safe-T-Centesis catheter was introduced. Thoracentesis was performed. The catheter was removed and a dressing applied. FINDINGS: A total of approximately 1.5 L of clear yellow fluid was removed. Samples were sent to the laboratory as requested by the clinical team. IMPRESSION: Successful ultrasound guided right thoracentesis yielding 1.5 L of  pleural fluid. Performed by Clotilda Hesselbach, PA-C Electronically Signed   By: Ester Sides M.D.   On: 01/27/2024 12:55   DG Chest 1 View Result Date: 01/27/2024 CLINICAL DATA:  Post thoracentesis. EXAM: CHEST  1 VIEW COMPARISON:  Radiographs 01/27/2024 and 02/15/2020. FINDINGS: 1129 hours. Interval decreased right pleural effusion with improved aeration of the right lung base. No definite pneumothorax. The heart size and mediastinal contours are stable with mild mediastinal shift to the right. The left lung appears clear. The bones appear unremarkable. IMPRESSION: Interval decreased right pleural effusion with improved aeration of the right lung base but incomplete re-expansion of the right lung. No definite pneumothorax. Electronically Signed   By: Elsie Perone M.D.   On: 01/27/2024 11:59   DG Chest Port 1 View Result Date: 01/27/2024 CLINICAL DATA:  Shortness of breath, wheezing, low O2 sats. EXAM: PORTABLE CHEST 1 VIEW COMPARISON:  02/15/2020. FINDINGS: Trachea is midline. Heart size stable. Large right pleural effusion with right basilar collapse/consolidation. Streaky atelectasis in the left lung base. IMPRESSION: Large right pleural effusion with collapse/consolidation in the right lung base, possibly due to pneumonia. Difficult exclude underlying malignancy. Followup PA and lateral chest X-ray is recommended in 3-4 weeks following trial of antibiotic therapy to ensure resolution and exclude underlying malignancy. Electronically Signed   By: Newell Eke M.D.   On: 01/27/2024 09:25    Alm Schneider, DO  [facility]  If 7PM-7AM, please contact night-coverage [email or web address] Password [email or web address] 02/02/2024, 11:40 AM   LOS: 6 days

## 2024-02-03 ENCOUNTER — Inpatient Hospital Stay (HOSPITAL_COMMUNITY)

## 2024-02-03 DIAGNOSIS — J90 Pleural effusion, not elsewhere classified: Secondary | ICD-10-CM | POA: Diagnosis not present

## 2024-02-03 DIAGNOSIS — I4891 Unspecified atrial fibrillation: Secondary | ICD-10-CM | POA: Diagnosis not present

## 2024-02-03 DIAGNOSIS — I5031 Acute diastolic (congestive) heart failure: Secondary | ICD-10-CM | POA: Diagnosis not present

## 2024-02-03 LAB — BASIC METABOLIC PANEL WITH GFR
Anion gap: 8 (ref 5–15)
BUN: 23 mg/dL (ref 8–23)
CO2: 31 mmol/L (ref 22–32)
Calcium: 8.4 mg/dL — ABNORMAL LOW (ref 8.9–10.3)
Chloride: 94 mmol/L — ABNORMAL LOW (ref 98–111)
Creatinine, Ser: 0.68 mg/dL (ref 0.61–1.24)
GFR, Estimated: >60 mL/min (ref >60)
Glucose, Bld: 205 mg/dL — ABNORMAL HIGH (ref 70–99)
Potassium: 3.6 mmol/L (ref 3.5–5.1)
Sodium: 133 mmol/L — ABNORMAL LOW (ref 135–145)

## 2024-02-03 LAB — PHOSPHORUS: Phosphorus: 2.5 mg/dL (ref 2.5–4.6)

## 2024-02-03 LAB — MAGNESIUM: Magnesium: 2 mg/dL (ref 1.7–2.4)

## 2024-02-03 MED ORDER — AMIODARONE HCL 200 MG PO TABS
200.0000 mg | ORAL_TABLET | Freq: Two times a day (BID) | ORAL | Status: DC
  Administered 2024-02-03 – 2024-02-04 (×3): 200 mg via ORAL
  Filled 2024-02-03 (×3): qty 1

## 2024-02-03 MED ORDER — FUROSEMIDE 40 MG PO TABS
40.0000 mg | ORAL_TABLET | Freq: Two times a day (BID) | ORAL | Status: DC
  Administered 2024-02-03 – 2024-02-04 (×2): 40 mg via ORAL
  Filled 2024-02-03 (×2): qty 1

## 2024-02-03 MED ORDER — DILTIAZEM HCL ER COATED BEADS 240 MG PO CP24
240.0000 mg | ORAL_CAPSULE | Freq: Every day | ORAL | Status: DC
  Administered 2024-02-03 – 2024-02-04 (×2): 240 mg via ORAL
  Filled 2024-02-03 (×2): qty 1

## 2024-02-03 MED ORDER — LEVALBUTEROL HCL 0.63 MG/3ML IN NEBU
0.6300 mg | INHALATION_SOLUTION | Freq: Two times a day (BID) | RESPIRATORY_TRACT | Status: DC
  Administered 2024-02-03 – 2024-02-04 (×2): 0.63 mg via RESPIRATORY_TRACT
  Filled 2024-02-03 (×2): qty 3

## 2024-02-03 MED ORDER — POTASSIUM CHLORIDE CRYS ER 20 MEQ PO TBCR
20.0000 meq | EXTENDED_RELEASE_TABLET | Freq: Once | ORAL | Status: AC
Start: 2024-02-03 — End: 2024-02-03
  Administered 2024-02-03: 20 meq via ORAL
  Filled 2024-02-03: qty 1

## 2024-02-03 MED ORDER — K PHOS MONO-SOD PHOS DI & MONO 155-852-130 MG PO TABS
500.0000 mg | ORAL_TABLET | Freq: Every day | ORAL | Status: DC
  Administered 2024-02-03 – 2024-02-04 (×2): 500 mg via ORAL
  Filled 2024-02-03 (×2): qty 2

## 2024-02-03 MED ORDER — FUROSEMIDE 10 MG/ML IJ SOLN
60.0000 mg | Freq: Two times a day (BID) | INTRAMUSCULAR | Status: DC
  Administered 2024-02-03: 60 mg via INTRAVENOUS
  Filled 2024-02-03: qty 6

## 2024-02-03 NOTE — [place] FL2 (Signed)
 [place]  MEDICAID FL2 LEVEL OF CARE FORM     IDENTIFICATION  Patient Name: Victor Shields Birthdate: 27-Aug-1950 Sex: male Admission Date (Current Location): 01/27/2024  [facility] and [place] Number:  [employer] and Address:  [facility],  [address], [place] [address]      Provider Number: [id number]  Attending Physician Name and Address:  Evonnie Alm, MD  Relative Name and Phone Number:  ROHAAN, DURNIL (Spouse)  [phone]    Current Level of Care: Hospital Recommended Level of Care: Skilled Nursing Facility Prior Approval Number:    Date Approved/Denied:   PASRR Number: [id number]  Discharge Plan: SNF    Current Diagnoses: Patient Active Problem List   Diagnosis Date Noted   PAF (paroxysmal atrial fibrillation) (HCC) 01/30/2024   Acute heart failure with preserved ejection fraction (HFpEF) (HCC) 01/29/2024   Acute hypoxemic respiratory failure (HCC) 01/29/2024   Acute respiratory distress 01/27/2024   Pleural effusion, right 01/27/2024   Leukocytosis 01/27/2024   Sinus tachycardia 01/27/2024   Peripheral edema 01/02/2024   History of hip fracture 08/09/2022   Hyperlipemia 05/19/2021   Cramping of feet 05/19/2021   COPD exacerbation (HCC) 02/15/2020   Hyperglycemia 02/15/2020   Tobacco abuse 05/28/2019    Orientation RESPIRATION BLADDER Height & Weight     Self, Time, Situation, Place  Normal Continent Weight: 141 lb 12.1 oz (64.3 kg) Height:  5' 8 (172.7 cm)  BEHAVIORAL SYMPTOMS/MOOD NEUROLOGICAL BOWEL NUTRITION STATUS      Continent Diet (Heart healthy)  AMBULATORY STATUS COMMUNICATION OF NEEDS Skin   Limited Assist Verbally Skin abrasions (Arm Distal;Lower;Posterior;Right and left)                       Personal Care Assistance Level of Assistance  Bathing, Dressing, Feeding Bathing Assistance: Limited assistance Feeding assistance: Independent Dressing Assistance: Limited assistance     Functional  Limitations Info  Sight, Hearing, Speech Sight Info: Impaired Hearing Info: Impaired Speech Info: Adequate    SPECIAL CARE FACTORS FREQUENCY  PT (By licensed PT), OT (By licensed OT)     PT Frequency: 5x/wk OT Frequency: 5x/wk            Contractures Contractures Info: Not present    Additional Factors Info  Code Status, Allergies Code Status Info: FULL Allergies Info: Penicillins           Current Medications (02/03/2024):  This is the current hospital active medication list Current Facility-Administered Medications  Medication Dose Route Frequency Provider Last Rate Last Admin   acetaminophen  (TYLENOL ) tablet 650 mg  650 mg Oral Q6H PRN Tat, Alm, MD       Or   acetaminophen  (TYLENOL ) suppository 650 mg  650 mg Rectal Q6H PRN Tat, Alm, MD       amiodarone  (PACERONE ) tablet 200 mg  200 mg Oral BID Mallipeddi, Vishnu P, MD   200 mg at 02/03/24 1220   apixaban  (ELIQUIS ) tablet 5 mg  5 mg Oral BID Tat, Alm, MD   5 mg at 02/03/24 9088   arformoterol  (BROVANA ) nebulizer solution 15 mcg  15 mcg Nebulization BID Tat, Alm, MD   15 mcg at 02/03/24 0710   bisacodyl  (DULCOLAX) EC tablet 5 mg  5 mg Oral Daily PRN Tat, Alm, MD   5 mg at 01/30/24 1018   budesonide  (PULMICORT ) nebulizer solution 0.5 mg  0.5 mg Nebulization BID Tat, Alm, MD   0.5 mg at 02/03/24 0715   Chlorhexidine  Gluconate Cloth  2 % PADS 6 each  6 each Topical Q0600 Tat, David, MD   6 each at 02/03/24 0524   clotrimazole  (MYCELEX ) troche 10 mg  10 mg Oral 5 X Daily Tat, David, MD   10 mg at 02/03/24 1352   dextromethorphan  (DELSYM ) 30 MG/5ML liquid 30 mg  30 mg Oral BID PRN Tat, Alm, MD       diltiazem  (CARDIZEM  CD) 24 hr capsule 240 mg  240 mg Oral Daily Tat, David, MD   240 mg at 02/03/24 1220   fentaNYL  (SUBLIMAZE ) injection 12.5 mcg  12.5 mcg Intravenous Q2H PRN Tat, Alm, MD       fluticasone  (FLONASE ) 50 MCG/ACT nasal spray 2 spray  2 spray Each Nare Daily Tat, David, MD   2 spray at 02/03/24 0911    furosemide  (LASIX ) tablet 40 mg  40 mg Oral BID Mallipeddi, Vishnu P, MD       guaiFENesin  (ROBITUSSIN) 100 MG/5ML liquid 15 mL  15 mL Oral QID Tat, Alm, MD   15 mL at 02/03/24 1351   levalbuterol  (XOPENEX ) nebulizer solution 0.63 mg  0.63 mg Nebulization BID Tat, Alm, MD       loratadine  (CLARITIN ) tablet 10 mg  10 mg Oral Daily Tat, David, MD   10 mg at 02/03/24 9088   meclizine  (ANTIVERT ) tablet 25 mg  25 mg Oral TID PRN Evonnie Alm, MD       methylPREDNISolone  sodium succinate (SOLU-MEDROL ) 125 mg/2 mL injection 60 mg  60 mg Intravenous BID Tat, Alm, MD   60 mg at 02/03/24 9089   nicotine  (NICODERM CQ  - dosed in mg/24 hours) patch 14 mg  14 mg Transdermal Daily Tat, Alm, MD   14 mg at 02/03/24 9082   ondansetron  (ZOFRAN ) tablet 4 mg  4 mg Oral Q6H PRN Tat, Alm, MD       Or   ondansetron  (ZOFRAN ) injection 4 mg  4 mg Intravenous Q6H PRN Tat, Alm, MD       Oral care mouth rinse  15 mL Mouth Rinse PRN Tat, Alm, MD       oxyCODONE  (Oxy IR/ROXICODONE ) immediate release tablet 5 mg  5 mg Oral Q4H PRN Tat, Alm, MD       polyethylene glycol (MIRALAX  / GLYCOLAX ) packet 17 g  17 g Oral Daily Tat, David, MD   17 g at 02/03/24 0911   revefenacin  (YUPELRI ) nebulizer solution 175 mcg  175 mcg Nebulization Daily Tat, Alm, MD   175 mcg at 02/03/24 0719   rosuvastatin  (CRESTOR ) tablet 5 mg  5 mg Oral Daily Tat, David, MD   5 mg at 02/03/24 9088   traZODone  (DESYREL ) tablet 25 mg  25 mg Oral QHS PRN Evonnie Alm, MD         Discharge Medications: Please see discharge summary for a list of discharge medications.  Relevant Imaging Results:  Relevant Lab Results:   Additional Information SSN: [id number]  Hoy DELENA Bigness, LCSW

## 2024-02-03 NOTE — TOC Progression Note (Signed)
 Transition of Care [facility]) - Progression Note    Patient Details  Name: Victor Shields MRN: [id number] Date of Birth: 10-22-1950  [facility] of [facility]) CM/SW Contact  Hoy DELENA Bigness, LCSW Phone Number: 02/03/2024, 3:32 PM  Clinical Narrative:    Pt now recommended for SNF. Pt and spouse agreeable to have referrals faxed out for placement. Pt's top choice for placement is at [facility] where he has been in the past. Referrals have been sent out and currently awaiting bed offers.    Expected Discharge Plan: Home w Home Health Services Barriers to Discharge: No Barriers Identified               Expected Discharge Plan and Services In-house Referral: Clinical Social Work Discharge Planning Services: NA Post Acute Care Choice: Home Health Living arrangements for the past 2 months: Single Family Home                           HH Arranged: PT HH Agency: [facility] Date HH Agency Contacted: 01/29/24 Time HH Agency Contacted: 1435 Representative spoke with at [facility] Agency: [place]   Social Drivers of Health (SDOH) Interventions SDOH Screenings   Food Insecurity: No Food Insecurity (01/27/2024)  Housing: Low Risk  (01/27/2024)  Transportation Needs: No Transportation Needs (01/27/2024)  Utilities: Not At Risk (01/27/2024)  Alcohol Screen: Low Risk  (12/03/2023)  Depression (PHQ2-9): Low Risk  (01/02/2024)  Financial Resource Strain: Low Risk  (12/03/2023)  Physical Activity: Insufficiently Active (12/03/2023)  Social Connections: Moderately Isolated (01/27/2024)  Stress: No Stress Concern Present (12/03/2023)  Tobacco Use: High Risk (01/27/2024)  Health Literacy: Adequate Health Literacy (12/03/2023)    Readmission Risk Interventions    01/28/2024   12:30 PM  Readmission Risk Prevention Plan  Post Dischage Appt Complete  Medication Screening Complete  Transportation Screening Complete

## 2024-02-03 NOTE — Progress Notes (Incomplete)
 PROGRESS NOTE  Victor Shields FMW:[id number] DOB: 1951-07-06 DOA: 01/27/2024 PCP: Lavell Bari LABOR, FNP  Brief History:  73 year old male with hypertension, hyperlipidemia, COPD, ongoing tobacco use, presented to the emergency department with acute onset shortness of breath and peripheral edema.  EMS found him to be hypoxic at home with oxygen saturation in the mid 80s.  He was wheezing and coughing.  His baseline supplemental oxygen was increased to 3 L with some improvement in oxygen saturation to the mid 90s.  He was given 125 mg of Solu-Medrol  IV by EMS.  He says that he was recently started on Lasix  by his PCP and after starting it was having symptoms of dizziness.  Over the past 2 to 3 days he has had progressive shortness of breath wheezing coughing and weakness.  Patient reports shortness of breath with minimal exertion which is not his baseline.  He has some shortness of breath symptoms but no fever chills or chest pain.  His chest x-ray showed findings of a large right pleural effusion with compressive atelectasis of the right lung and possible pneumonia.  He had an elevated BNP.  White blood cell count 11.9.  Respiratory panel negative for influenza RSV and SARS coronavirus 2.  Hospital admission was requested for management. Patient was started on IV Solu-Medrol  and bronchodilators with gradual improvement.  He was weaned off oxygen.  He underwent thoracocentesis removing 1.5 L.  He was given a dose of IV furosemide  for his fluid overload.  Unfortunately, the patient developed atrial fibrillation with RVR on the evening of 01/29/2024.  He was placed on diltiazem  drip.  His rates remained elevated in the 140s.  Cardiology was consulted to assist with management.   Assessment/Plan:   Principal Problem:   Acute respiratory distress Active Problems:   Tobacco abuse   COPD exacerbation (HCC)   Peripheral edema   Pleural effusion, right   Leukocytosis   Sinus tachycardia    Acute heart failure with preserved ejection fraction (HFpEF) (HCC)   Acute hypoxemic respiratory failure (HCC)   PAF (paroxysmal atrial fibrillation) (HCC)  Assessment and Plan: No notes have been filed under this hospital service. Service: Hospitalist        Status is: Inpatient {Inpatient:23812}    Family Communication:   Family at bedside  Consultants:    Code Status:  FULL / DNR  DVT Prophylaxis:  [place] Heparin  / [place] Lovenox    Procedures: As Listed in Progress Note Above  Antibiotics: None  RN Pressure Injury Documentation:        Subjective:   Objective: Vitals:   02/03/24 1300 02/03/24 1400 02/03/24 1530 02/03/24 1600  BP: (!) 149/46 (!) 108/46 (!) 132/39 (!) 134/42  Pulse: 62 72 74 67  Resp: 13 18 20 19   Temp: 98.2 F (36.8 C)     TempSrc: Oral     SpO2: 97% 97% 98% 97%  Weight:      Height:        Intake/Output Summary (Last 24 hours) at 02/03/2024 1713 Last data filed at 02/03/2024 1600 Gross per 24 hour  Intake 419.76 ml  Output 1180 ml  Net -760.24 ml   Weight change:  Exam:  General:  Pt is alert, follows commands appropriately, not in acute distress HEENT: No icterus, No thrush, No neck mass, [place]/AT Cardiovascular: RRR, S1/S2, no rubs, no gallops Respiratory: CTA bilaterally, no wheezing, no crackles, no rhonchi Abdomen: Soft/+BS, non tender, non distended, no  guarding Extremities: No edema, No lymphangitis, No petechiae, No rashes, no synovitis   Data Reviewed: I have personally reviewed following labs and imaging studies Basic Metabolic Panel: Recent Labs  Lab 01/30/24 0427 01/31/24 0420 02/01/24 0500 02/02/24 0606 02/03/24 0439  NA 139 136 136 135 133*  K 3.9 3.9 4.0 4.2 3.6  CL 105 104 101 95* 94*  CO2 25 23 26 31 31   GLUCOSE 151* 158* 154* 158* 205*  BUN 23 26* 24* 23 23  CREATININE 0.60* 0.73 0.69 0.75 0.68  CALCIUM  8.9 8.5* 8.5* 8.3* 8.4*  MG 2.0 1.9 1.8 2.1 2.0  PHOS  --   --   --  2.9 2.5   Liver Function  Tests: No results for input(s): AST, ALT, ALKPHOS, BILITOT, PROT, ALBUMIN in the last 168 hours. No results for input(s): LIPASE, AMYLASE in the last 168 hours. No results for input(s): AMMONIA in the last 168 hours. Coagulation Profile: No results for input(s): INR, PROTIME in the last 168 hours. CBC: Recent Labs  Lab 01/28/24 0449 01/29/24 0444 01/30/24 0427  WBC 9.5 19.3* 17.5*  NEUTROABS 8.5* 17.3* 15.8*  HGB 13.3 12.3* 15.5  HCT 40.8 36.5* 46.7  MCV 97.1 93.6 94.5  PLT 249 PLATELET CLUMPS NOTED ON SMEAR, UNABLE TO ESTIMATE 334   Cardiac Enzymes: No results for input(s): CKTOTAL, CKMB, CKMBINDEX, TROPONINI in the last 168 hours. BNP: Invalid input(s): POCBNP CBG: No results for input(s): GLUCAP in the last 168 hours. HbA1C: No results for input(s): HGBA1C in the last 72 hours. Urine analysis:    Component Value Date/Time   APPEARANCEUR Cloudy (A) 05/19/2021 1459   GLUCOSEU Negative 05/19/2021 1459   BILIRUBINUR Negative 05/19/2021 1459   PROTEINUR 1+ (A) 05/19/2021 1459   NITRITE Negative 05/19/2021 1459   LEUKOCYTESUR Negative 05/19/2021 1459   Sepsis Labs: @LABRCNTIP (procalcitonin:4,lacticidven:4) ) Recent Results (from the past 240 hours)  Resp panel by RT-PCR (RSV, Flu A&B, Covid) Anterior Nasal Swab     Status: None   Collection Time: 01/27/24  9:15 AM   Specimen: Anterior Nasal Swab  Result Value Ref Range Status   SARS Coronavirus 2 by RT PCR NEGATIVE NEGATIVE Final    Comment: (NOTE) SARS-CoV-2 target nucleic acids are NOT DETECTED.  The SARS-CoV-2 RNA is generally detectable in upper respiratory specimens during the acute phase of infection. The lowest concentration of SARS-CoV-2 viral copies this assay can detect is 138 copies/mL. A negative result does not preclude SARS-Cov-2 infection and should not be used as the sole basis for treatment or other patient management decisions. A negative result may occur with   improper specimen collection/handling, submission of specimen other than nasopharyngeal swab, presence of viral mutation(s) within the areas targeted by this assay, and inadequate number of viral copies(<138 copies/mL). A negative result must be combined with clinical observations, patient history, and epidemiological information. The expected result is Negative.  Fact Sheet for Patients:  BloggerCourse.com  Fact Sheet for Healthcare Providers:  SeriousBroker.it  This test is no t yet approved or cleared by the [place]  FDA and  has been authorized for detection and/or diagnosis of SARS-CoV-2 by FDA under an Emergency Use Authorization (EUA). This EUA will remain  in effect (meaning this test can be used) for the duration of the COVID-19 declaration under Section 564(b)(1) of the Act, 21 U.S.C.section 360bbb-3(b)(1), unless the authorization is terminated  or revoked sooner.       Influenza A by PCR NEGATIVE NEGATIVE Final   Influenza B by PCR  NEGATIVE NEGATIVE Final    Comment: (NOTE) The Xpert Xpress SARS-CoV-2/FLU/RSV plus assay is intended as an aid in the diagnosis of influenza from Nasopharyngeal swab specimens and should not be used as a sole basis for treatment. Nasal washings and aspirates are unacceptable for Xpert Xpress SARS-CoV-2/FLU/RSV testing.  Fact Sheet for Patients: BloggerCourse.com  Fact Sheet for Healthcare Providers: SeriousBroker.it  This test is not yet approved or cleared by the [place]  FDA and has been authorized for detection and/or diagnosis of SARS-CoV-2 by FDA under an Emergency Use Authorization (EUA). This EUA will remain in effect (meaning this test can be used) for the duration of the COVID-19 declaration under Section 564(b)(1) of the Act, 21 U.S.C. section 360bbb-3(b)(1), unless the authorization is terminated or revoked.      Resp Syncytial Virus by PCR NEGATIVE NEGATIVE Final    Comment: (NOTE) Fact Sheet for Patients: BloggerCourse.com  Fact Sheet for Healthcare Providers: SeriousBroker.it  This test is not yet approved or cleared by the [place]  FDA and has been authorized for detection and/or diagnosis of SARS-CoV-2 by FDA under an Emergency Use Authorization (EUA). This EUA will remain in effect (meaning this test can be used) for the duration of the COVID-19 declaration under Section 564(b)(1) of the Act, 21 U.S.C. section 360bbb-3(b)(1), unless the authorization is terminated or revoked.  Performed at [facility], [address] [place], [place] [address]   Culture, blood (Routine X 2) w Reflex to ID Panel     Status: None   Collection Time: 01/27/24 10:39 AM   Specimen: BLOOD  Result Value Ref Range Status   Specimen Description BLOOD BLOOD RIGHT ARM  Final   Special Requests   Final    BOTTLES DRAWN AEROBIC AND ANAEROBIC Blood Culture adequate volume   Culture   Final    NO GROWTH 5 DAYS Performed at [facility], [address], [place], [place] [address]    Report Status 02/01/2024 FINAL  Final  Culture, blood (Routine X 2) w Reflex to ID Panel     Status: None   Collection Time: 01/27/24 10:45 AM   Specimen: BLOOD  Result Value Ref Range Status   Specimen Description BLOOD BLOOD LEFT ARM  Final   Special Requests   Final    BOTTLES DRAWN AEROBIC ONLY Blood Culture results may not be optimal due to an inadequate volume of blood received in culture bottles   Culture   Final    NO GROWTH 5 DAYS Performed at [facility], [address], [place], [place] [address]    Report Status 02/01/2024 FINAL  Final  MRSA Next Gen by PCR, Nasal     Status: None   Collection Time: 01/29/24  7:00 PM   Specimen: Nasal Mucosa; Nasal Swab  Result Value Ref Range Status   MRSA by PCR Next Gen NOT DETECTED NOT DETECTED Final    Comment:  (NOTE) The GeneXpert MRSA Assay (FDA approved for NASAL specimens only), is one component of a comprehensive MRSA colonization surveillance program. It is not intended to diagnose MRSA infection nor to guide or monitor treatment for MRSA infections. Test performance is not FDA approved in patients less than 38 years old. Performed at [facility], [address], [place], [place] [address]      Scheduled Meds:  amiodarone   200 mg Oral BID   apixaban   5 mg Oral BID   arformoterol   15 mcg Nebulization BID   budesonide  (PULMICORT ) nebulizer solution  0.5 mg Nebulization  BID   Chlorhexidine  Gluconate Cloth  6 each Topical Q0600   clotrimazole   10 mg Oral 5 X Daily   diltiazem   240 mg Oral Daily   fluticasone   2 spray Each Nare Daily   furosemide   40 mg Oral BID   guaiFENesin   15 mL Oral QID   levalbuterol   0.63 mg Nebulization BID   loratadine   10 mg Oral Daily   methylPREDNISolone  (SOLU-MEDROL ) injection  60 mg Intravenous BID   nicotine   14 mg Transdermal Daily   polyethylene glycol  17 g Oral Daily   revefenacin   175 mcg Nebulization Daily   rosuvastatin   5 mg Oral Daily   Continuous Infusions:  Procedures/Studies: DG CHEST PORT 1 VIEW Result Date: 02/03/2024 CLINICAL DATA:  [id number].  Right pleural effusion. EXAM: PORTABLE CHEST 1 VIEW COMPARISON:  Portable chest 01/28/2024 FINDINGS: 5:10 a.m. Again noted are a small left and moderate right pleural effusions, on the right with overlying atelectasis or consolidation nearly to the mid hilum. Rest of the lungs are generally clear. The cardiac size is normal. Mediastinum is normally outlined. There is aortic atherosclerosis. Osteopenia and thoracic spondylosis. Overall aeration seems unchanged. IMPRESSION: Small left and moderate right pleural effusions, on the right with overlying atelectasis or consolidation nearly to the mid hilum. No significant change. Stable overall aeration. Electronically Signed   By: Francis Quam M.D.   On:  02/03/2024 06:52   CT CHEST WO CONTRAST Result Date: 01/30/2024 CLINICAL DATA:  Rhonchi and wheezing.  Decreased breath sounds. EXAM: CT CHEST WITHOUT CONTRAST TECHNIQUE: Multidetector CT imaging of the chest was performed following the standard protocol without IV contrast. RADIATION DOSE REDUCTION: This exam was performed according to the departmental dose-optimization program which includes automated exposure control, adjustment of the mA and/or kV according to patient size and/or use of iterative reconstruction technique. COMPARISON:  January 28, 2024. FINDINGS: Cardiovascular: Atherosclerosis of thoracic aorta is noted without aneurysm formation. Normal cardiac size. No pericardial effusion. Coronary artery calcifications are noted. Mediastinum/Nodes: No enlarged mediastinal or axillary lymph nodes. Thyroid  gland, trachea, and esophagus demonstrate no significant findings. Lungs/Pleura: Mild to moderate right pleural effusion is noted. Small left pleural effusion is noted. No pneumothorax is noted. Emphysematous disease is noted. Right lower lobe and middle lobe opacities are noted concerning for pneumonia or atelectasis. Upper Abdomen: Possible nodular hepatic contours are noted suggesting cirrhosis. Musculoskeletal: No chest wall mass or suspicious bone lesions identified. IMPRESSION: Bilateral pleural effusions as noted above, right greater than left. Right lower lobe and middle lobe opacities are noted concerning for pneumonia or atelectasis. Coronary artery calcifications are noted. 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 Aortic Atherosclerosis (ICD10-I70.0) and Emphysema (ICD10-J43.9). Electronically Signed   By: Lynwood Landy Raddle M.D.   On: 01/30/2024 16:08   Portable chest 1 View Result Date: 01/28/2024 CLINICAL DATA:  Pleural effusion. EXAM: PORTABLE CHEST 1 VIEW COMPARISON:  01/27/2024 FINDINGS: Right base collapse/consolidation with effusion is similar to prior. Left lung is clear. Interstitial markings are  diffusely coarsened with chronic features. Cardiopericardial silhouette is at upper limits of normal for size. No acute bony abnormality. IMPRESSION: Right base collapse/consolidation with effusion, similar to prior. Electronically Signed   By: Camellia Candle M.D.   On: 01/28/2024 06:37   ECHOCARDIOGRAM COMPLETE Result Date: 01/27/2024    ECHOCARDIOGRAM REPORT   Patient Name:   [facility] Arick Mareno Kellett Date of Exam: 01/27/2024 Medical Rec #:  [id number]           Height:  68.0 in Accession #:    [id number]          Weight:       135.4 lb Date of Birth:  [birth year]           BSA:          1.731 m Patient Age:    73 years            BP:           159/72 mmHg Patient Gender: M                   HR:           110 bpm. Exam Location:  [facility] Procedure: 2D Echo, Cardiac Doppler and Color Doppler (Both Spectral and Color            Flow Doppler were utilized during procedure). Indications:    dyspnea  History:        Patient has no prior history of Echocardiogram examinations.                 COPD; Risk Factors:Current Smoker.  Sonographer:    Benard Stallion Referring Phys: [phone] CLANFORD L JOHNSON IMPRESSIONS  1. Left ventricular ejection fraction, by estimation, is 60 to 65%. The left ventricle has normal function. The left ventricle has no regional wall motion abnormalities. Left ventricular diastolic function could not be evaluated.  2. Right ventricular systolic function is normal. The right ventricular size is normal. There is moderately elevated pulmonary artery systolic pressure.  3. The mitral valve is normal in structure. Trivial mitral valve regurgitation. No evidence of mitral stenosis.  4. The aortic valve is normal in structure. Aortic valve regurgitation is not visualized. No aortic stenosis is present.  5. The inferior vena cava is normal in size with greater than 50% respiratory variability, suggesting right atrial pressure of 3 mmHg. FINDINGS  Left Ventricle: Left ventricular ejection fraction, by  estimation, is 60 to 65%. The left ventricle has normal function. The left ventricle has no regional wall motion abnormalities. The left ventricular internal cavity size was normal in size. There is  no left ventricular hypertrophy. Left ventricular diastolic function could not be evaluated. Right Ventricle: The right ventricular size is normal. No increase in right ventricular wall thickness. Right ventricular systolic function is normal. There is moderately elevated pulmonary artery systolic pressure. The tricuspid regurgitant velocity is 3.30 m/s, and with an assumed right atrial pressure of 3 mmHg, the estimated right ventricular systolic pressure is 46.6 mmHg. Left Atrium: Left atrial size was normal in size. Right Atrium: Right atrial size was normal in size. Pericardium: There is no evidence of pericardial effusion. Mitral Valve: The mitral valve is normal in structure. Trivial mitral valve regurgitation. No evidence of mitral valve stenosis. Tricuspid Valve: The tricuspid valve is normal in structure. Tricuspid valve regurgitation is mild . No evidence of tricuspid stenosis. Aortic Valve: The aortic valve is normal in structure. Aortic valve regurgitation is not visualized. No aortic stenosis is present. Aortic valve mean gradient measures 3.0 mmHg. Aortic valve peak gradient measures 6.5 mmHg. Aortic valve area, by VTI measures 3.31 cm. Pulmonic Valve: The pulmonic valve was normal in structure. Pulmonic valve regurgitation is not visualized. No evidence of pulmonic stenosis. Aorta: The aortic root is normal in size and structure. Venous: The inferior vena cava is normal in size with greater than 50% respiratory variability, suggesting right atrial pressure of 3 mmHg. IAS/Shunts: No atrial level shunt  detected by color flow Doppler.  LEFT VENTRICLE PLAX 2D LVIDd:         4.95 cm   Diastology LVIDs:         3.70 cm   LV e' medial:  7.51 cm/s LV PW:         0.85 cm   LV e' lateral: 9.25 cm/s LV IVS:         0.85 cm LVOT diam:     2.20 cm LV SV:         65 LV SV Index:   37 LVOT Area:     3.80 cm  RIGHT VENTRICLE RV Basal diam:  3.25 cm RV Mid diam:    3.15 cm RV S prime:     18.60 cm/s TAPSE (M-mode): 2.4 cm LEFT ATRIUM           Index        RIGHT ATRIUM           Index LA Vol (A4C): 67.0 ml 38.70 ml/m  RA Area:     12.30 cm                                    RA Volume:   32.30 ml  18.66 ml/m  AORTIC VALVE AV Area (Vmax):    3.35 cm AV Area (Vmean):   3.30 cm AV Area (VTI):     3.31 cm AV Vmax:           127.00 cm/s AV Vmean:          83.400 cm/s AV VTI:            0.195 m AV Peak Grad:      6.5 mmHg AV Mean Grad:      3.0 mmHg LVOT Vmax:         112.00 cm/s LVOT Vmean:        72.300 cm/s LVOT VTI:          0.170 m LVOT/AV VTI ratio: 0.87  AORTA Ao Root diam: 3.20 cm TRICUSPID VALVE TR Peak grad:   43.6 mmHg TR Vmax:        330.00 cm/s  SHUNTS Systemic VTI:  0.17 m Systemic Diam: 2.20 cm Wilbert Bihari MD Electronically signed by Wilbert Bihari MD Signature Date/Time: 01/27/2024/6:41:20 PM    Final    US  THORACENTESIS ASP PLEURAL SPACE W/IMG GUIDE Result Date: 01/27/2024 INDICATION: Patient with history of COPD admitted with dyspnea and found to have large right pleural effusion. Request for diagnostic and therapeutic right thoracentesis. EXAM: ULTRASOUND GUIDED RIGHT THORACENTESIS MEDICATIONS: 6 mL 1% lidocaine  COMPLICATIONS: None immediate. PROCEDURE: An ultrasound guided thoracentesis was thoroughly discussed with the patient and questions answered. The benefits, risks, alternatives and complications were also discussed. The patient understands and wishes to proceed with the procedure. Written consent was obtained. Ultrasound was performed to localize and mark an adequate pocket of fluid in the right chest. The area was then prepped and draped in the normal sterile fashion. 1% Lidocaine  was used for local anesthesia. Under ultrasound guidance a 6 Fr Safe-T-Centesis catheter was introduced. Thoracentesis was  performed. The catheter was removed and a dressing applied. FINDINGS: A total of approximately 1.5 L of clear yellow fluid was removed. Samples were sent to the laboratory as requested by the clinical team. IMPRESSION: Successful ultrasound guided right thoracentesis yielding 1.5 L of pleural fluid. Performed by Clotilda Hesselbach, PA-C  Electronically Signed   By: Ester Sides M.D.   On: 01/27/2024 12:55   DG Chest 1 View Result Date: 01/27/2024 CLINICAL DATA:  Post thoracentesis. EXAM: CHEST  1 VIEW COMPARISON:  Radiographs 01/27/2024 and 02/15/2020. FINDINGS: 1129 hours. Interval decreased right pleural effusion with improved aeration of the right lung base. No definite pneumothorax. The heart size and mediastinal contours are stable with mild mediastinal shift to the right. The left lung appears clear. The bones appear unremarkable. IMPRESSION: Interval decreased right pleural effusion with improved aeration of the right lung base but incomplete re-expansion of the right lung. No definite pneumothorax. Electronically Signed   By: Elsie Perone M.D.   On: 01/27/2024 11:59   DG Chest Port 1 View Result Date: 01/27/2024 CLINICAL DATA:  Shortness of breath, wheezing, low O2 sats. EXAM: PORTABLE CHEST 1 VIEW COMPARISON:  02/15/2020. FINDINGS: Trachea is midline. Heart size stable. Large right pleural effusion with right basilar collapse/consolidation. Streaky atelectasis in the left lung base. IMPRESSION: Large right pleural effusion with collapse/consolidation in the right lung base, possibly due to pneumonia. Difficult exclude underlying malignancy. Followup PA and lateral chest X-ray is recommended in 3-4 weeks following trial of antibiotic therapy to ensure resolution and exclude underlying malignancy. Electronically Signed   By: Newell Eke M.D.   On: 01/27/2024 09:25    Alm Schneider, DO  [facility]  If 7PM-7AM, please contact night-coverage [email or web address] Password [email or web address] 02/03/2024, 5:13  PM   LOS: 7 days

## 2024-02-03 NOTE — Plan of Care (Signed)

## 2024-02-03 NOTE — Plan of Care (Signed)
  Problem: Skin Integrity: Goal: Risk for impaired skin integrity will decrease 02/03/2024 0523 by Lorrain Hazeline KIDD, RN Outcome: Not Progressing 02/03/2024 0523 by Lorrain Hazeline KIDD, RN Outcome: Progressing   Problem: Clinical Measurements: Goal: Cardiovascular complication will be avoided 02/03/2024 0523 by Lorrain Hazeline KIDD, RN Outcome: Progressing 02/03/2024 0523 by Lorrain Hazeline KIDD, RN Outcome: Progressing   Problem: Clinical Measurements: Goal: Respiratory complications will improve 02/03/2024 0523 by Lorrain Hazeline KIDD, RN Outcome: Progressing 02/03/2024 0523 by Lorrain Hazeline KIDD, RN Outcome: Progressing

## 2024-02-03 NOTE — Progress Notes (Addendum)
 Progress Note  Patient Name: Victor Shields Date of Encounter: 02/03/2024  Primary Cardiologist: None  Subjective   SOB improved.  No swelling in the legs.  Overall doing great.  Inpatient Medications    Scheduled Meds:  apixaban   5 mg Oral BID   arformoterol   15 mcg Nebulization BID   budesonide  (PULMICORT ) nebulizer solution  0.5 mg Nebulization BID   Chlorhexidine  Gluconate Cloth  6 each Topical Q0600   clotrimazole   10 mg Oral 5 X Daily   diltiazem   90 mg Oral Q6H   fluticasone   2 spray Each Nare Daily   furosemide   60 mg Intravenous BID   guaiFENesin   15 mL Oral QID   levalbuterol   0.63 mg Nebulization TID   loratadine   10 mg Oral Daily   methylPREDNISolone  (SOLU-MEDROL ) injection  60 mg Intravenous BID   nicotine   14 mg Transdermal Daily   polyethylene glycol  17 g Oral Daily   revefenacin   175 mcg Nebulization Daily   rosuvastatin   5 mg Oral Daily   Continuous Infusions:  amiodarone  30 mg/hr (02/03/24 0115)   PRN Meds: acetaminophen  **OR** acetaminophen , bisacodyl , dextromethorphan , fentaNYL  (SUBLIMAZE ) injection, ipratropium-albuterol , meclizine , ondansetron  **OR** ondansetron  (ZOFRAN ) IV, mouth rinse, oxyCODONE , traZODone    Vital Signs    Vitals:   02/03/24 1000 02/03/24 1030 02/03/24 1045 02/03/24 1100  BP: (!) 154/37   (!) 120/45  Pulse: (!) 58 62 64 66  Resp: 16 18 17 19   Temp:      TempSrc:      SpO2: 95%   94%  Weight:      Height:        Intake/Output Summary (Last 24 hours) at 02/03/2024 1126 Last data filed at 02/03/2024 1123 Gross per 24 hour  Intake --  Output 1480 ml  Net -1480 ml   Filed Weights   02/02/24 1201 02/03/24 0618 02/03/24 0654  Weight: 64.5 kg 69.3 kg 64.3 kg    Telemetry     Personally reviewed.  NSR.  ECG   Not performed today.  Physical Exam   GEN: No acute distress.   Neck: No JVD. Cardiac: RRR, no murmur, rub, or gallop.  Respiratory: Bilateral wheezing and rhonchi, improved. GI: Soft, nontender,  bowel sounds present. MS: No edema; No deformity. Neuro:  Nonfocal. Psych: Alert and oriented x 3. Normal affect.  Labs    Chemistry Recent Labs  Lab 02/01/24 0500 02/02/24 0606 02/03/24 0439  NA 136 135 133*  K 4.0 4.2 3.6  CL 101 95* 94*  CO2 26 31 31   GLUCOSE 154* 158* 205*  BUN 24* 23 23  CREATININE 0.69 0.75 0.68  CALCIUM  8.5* 8.3* 8.4*  GFRNONAA >60 >60 >60  ANIONGAP 9 9 8      Hematology Recent Labs  Lab 01/28/24 0449 01/29/24 0444 01/30/24 0427  WBC 9.5 19.3* 17.5*  RBC 4.20* 3.90* 4.94  HGB 13.3 12.3* 15.5  HCT 40.8 36.5* 46.7  MCV 97.1 93.6 94.5  MCH 31.7 31.5 31.4  MCHC 32.6 33.7 33.2  RDW 14.0 14.0 14.1  PLT 249 PLATELET CLUMPS NOTED ON SMEAR, UNABLE TO ESTIMATE 334    Cardiac Enzymes Recent Labs  Lab 01/27/24 0858  TROPONINIHS 7    BNPNo results for input(s): BNP, PROBNP in the last 168 hours.   DDimerNo results for input(s): DDIMER in the last 168 hours.   Radiology    DG CHEST PORT 1 VIEW Result Date: 02/03/2024 CLINICAL DATA:  [id number].  Right pleural effusion. EXAM: PORTABLE  CHEST 1 VIEW COMPARISON:  Portable chest 01/28/2024 FINDINGS: 5:10 a.m. Again noted are a small left and moderate right pleural effusions, on the right with overlying atelectasis or consolidation nearly to the mid hilum. Rest of the lungs are generally clear. The cardiac size is normal. Mediastinum is normally outlined. There is aortic atherosclerosis. Osteopenia and thoracic spondylosis. Overall aeration seems unchanged. IMPRESSION: Small left and moderate right pleural effusions, on the right with overlying atelectasis or consolidation nearly to the mid hilum. No significant change. Stable overall aeration. Electronically Signed   By: Francis Quam M.D.   On: 02/03/2024 06:52   Assessment & Plan   Atrial fibrillation/flutter with RVR - Telemetry reviewed, has been in normal sinus rhythm.  Previously had tough time controlling his heart rates, initially on IV  Cardizem  drip, amiodarone  and digoxin . - Currently on diltiazem  90 mg every 6 hours, will switch to diltiazem  240 mg once daily. - Currently on amiodarone  drip, switch to p.o. amiodarone  200 mg twice daily.  He needs 38-month outpatient amiodarone  therapy and to discontinue. - Continue Eliquis  5 mg twice daily. - Echocardiogram this admission showed normal LVEF, normal RV function, no valvular heart disease and CVP 3 mmHg.   Acute diastolic heart failure -Presented with DOE.  BNP 376 on admission. S/p 1.5L thoracentesis on the right side.  Chest x-ray and CT chest showed reaccumulating pleural effusion on the right side.  Initially had 2+ pitting edema that resolved with IV Lasix .  Currently on IV Lasix  60 mg twice daily, will switch to p.o. Lasix  40 mg twice daily. -Reds Vest today   Moderate right pleural effusion - s/p right-sided thoracentesis, 1.5L.  Repeat chest x-ray and CT chest showed reaccumulating pleural effusion on the right side and concern for pneumonia/atelectasis.  Management per primary.   COPD exacerbation -Management per primary.    Signed, Diannah SHAUNNA Maywood, MD  02/03/2024, 11:26 AM

## 2024-02-03 NOTE — Evaluation (Signed)
 Physical Therapy Evaluation Patient Details Name: Victor Shields MRN: [id number] DOB: 1950/10/07 Today's Date: 02/03/2024  History of Present Illness  73 year old male with hypertension, hyperlipidemia, COPD, ongoing tobacco use, presented to the emergency department with acute onset shortness of breath and peripheral edema.  EMS found him to be hypoxic at home with oxygen saturation in the mid 80s.  He was wheezing and coughing.  His baseline supplemental oxygen was increased to 3 L with some improvement in oxygen saturation to the mid 90s.  He was given 125 mg of Solu-Medrol  IV by EMS.  He says that he was recently started on Lasix  by his PCP and after starting it was having symptoms of dizziness.  Over the past 2 to 3 days he has had progressive shortness of breath wheezing coughing and weakness.  Patient reports shortness of breath with minimal exertion which is not his baseline.  He has some shortness of breath symptoms but no fever chills or chest pain.  His chest x-ray showed findings of a large right pleural effusion with compressive atelectasis of the right lung and possible pneumonia.  He had an elevated BNP.  White blood cell count 11.9.  Respiratory panel negative for influenza RSV and SARS coronavirus 2.  Hospital admission was requested for management.  Patient was started on IV Solu-Medrol  and bronchodilators with gradual improvement.  He was weaned off oxygen.  He underwent thoracocentesis removing 1.5 L.  He was given a dose of IV furosemide  for his fluid overload.  Unfortunately, the patient developed atrial fibrillation with RVR on the evening of 01/29/2024.  He was placed on diltiazem  drip.  His rates remained elevated in the 140s.  Cardiology was consulted to assist with management   Clinical Impression  Patient demonstrates slow labored movement for sitting up at bedside requiring handheld assist to completely pull to sit with c/o of persistent dizziness. Patient able to ambulate  forward and backward at bedside without loss of balance but very unsteady on his feet and heavy UE reliance on the RW for stability. Patient demonstrates decreased dynamic balance and poor activity tolerance putting him at an increased risk for falls. Patient limited significantly in today's evaluation by c/o of dizziness and SOB with exertion. Patient will benefit from continued skilled physical therapy in hospital and recommended venue below to increase strength, balance, endurance for safe ADLs and gait.         If plan is discharge home, recommend the following: A little help with walking and/or transfers;A little help with bathing/dressing/bathroom;Help with stairs or ramp for entrance;Assist for transportation;Assistance with cooking/housework   Can travel by private vehicle   No    Equipment Recommendations None recommended by PT  Recommendations for Other Services       Functional Status Assessment Patient has had a recent decline in their functional status and demonstrates the ability to make significant improvements in function in a reasonable and predictable amount of time.     Precautions / Restrictions Precautions Precautions: Fall Recall of Precautions/Restrictions: Intact Restrictions Weight Bearing Restrictions Per Provider Order: No      Mobility  Bed Mobility Overal bed mobility: Needs Assistance Bed Mobility: Supine to Sit     Supine to sit: Min assist     General bed mobility comments: handheld assist from SPT to pull to sit    Transfers Overall transfer level: Needs assistance Equipment used: Rolling walker (2 wheels), 1 person hand held assist Transfers: Sit to/from Stand, Bed to chair/wheelchair/BSC Sit to  Stand: Min assist   Step pivot transfers: Min assist       General transfer comment: very unsteady on feet with c/o dizziness and SOB    Ambulation/Gait Ambulation/Gait assistance: Min assist Gait Distance (Feet): 10 Feet Assistive  device: Rolling walker (2 wheels) Gait Pattern/deviations: Trunk flexed, Decreased step length - right, Decreased step length - left, Decreased stride length Gait velocity: decreased     General Gait Details: slow labored movement, c/o dizziness and SOB, limited to bedside  Stairs            Wheelchair Mobility     Tilt Bed    Modified Rankin (Stroke Patients Only)       Balance Overall balance assessment: Needs assistance Sitting-balance support: Feet supported, No upper extremity supported Sitting balance-Leahy Scale: Good Sitting balance - Comments: good seated at EOB to complete ther ex   Standing balance support: During functional activity, Reliant on assistive device for balance, Bilateral upper extremity supported Standing balance-Leahy Scale: Poor Standing balance comment: poor with use of RW                             Pertinent Vitals/Pain Pain Assessment Pain Assessment: No/denies pain    Home Living Family/patient expects to be discharged to:: Private residence Living Arrangements: Spouse/significant other   Type of Home: Nurse, adult of Steps: 2  with bilateral posts he can use   Home Layout: One level Home Equipment: Rollator (4 wheels);Shower seat;BSC/3in1 Additional Comments: Info same, Patient had change in medical status    Prior Function Prior Level of Function : Independent/Modified Independent             Mobility Comments: household ambulation with SPC PRN ADLs Comments: Assisted by family     Extremity/Trunk Assessment        Lower Extremity Assessment Lower Extremity Assessment: Generalized weakness    Cervical / Trunk Assessment Cervical / Trunk Assessment: Kyphotic  Communication   Communication Communication: Impaired Factors Affecting Communication: Hearing impaired    Cognition Arousal: Alert Behavior During Therapy: WFL for tasks assessed/performed   PT - Cognitive  impairments: No apparent impairments                         Following commands: Intact       Cueing       General Comments      Exercises General Exercises - Lower Extremity Long Arc Quad: AROM, 10 reps, Right, Left, Seated Hip Flexion/Marching: 10 reps, Both, Seated, AROM Toe Raises: 10 reps, AROM, Right, Left, Seated Heel Raises: 10 reps, AROM, Left, Right, Seated   Assessment/Plan    PT Assessment Patient needs continued PT services  PT Problem List Decreased strength;Decreased activity tolerance;Decreased balance;Decreased mobility       PT Treatment Interventions DME instruction;Gait training;Stair training;Functional mobility training;Therapeutic activities;Therapeutic exercise;Balance training;Patient/family education    PT Goals (Current goals can be found in the Care Plan section)  Acute Rehab PT Goals Patient Stated Goal: return home with family to assist PT Goal Formulation: With patient/family Time For Goal Achievement: 02/17/24 Potential to Achieve Goals: Good    Frequency Min 3X/week     Co-evaluation               AM-PAC PT 6 Clicks Mobility  Outcome Measure Help needed turning from your back to your side while in a flat bed without  using bedrails?: None Help needed moving from lying on your back to sitting on the side of a flat bed without using bedrails?: A Little Help needed moving to and from a bed to a chair (including a wheelchair)?: A Little Help needed standing up from a chair using your arms (e.g., wheelchair or bedside chair)?: A Lot Help needed to walk in hospital room?: A Lot Help needed climbing 3-5 steps with a railing? : A Lot 6 Click Score: 16    End of Session Equipment Utilized During Treatment: Gait belt Activity Tolerance: Patient limited by fatigue Patient left: in chair;with call bell/phone within reach;with family/visitor present Nurse Communication: Mobility status PT Visit Diagnosis: Unsteadiness on  feet (R26.81);Other abnormalities of gait and mobility (R26.89);Muscle weakness (generalized) (M62.81)    Time: 8549-8485 PT Time Calculation (min) (ACUTE ONLY): 24 min   Charges:   PT Evaluation $PT Eval Moderate Complexity: 1 Mod PT Treatments $Therapeutic Activity: 23-37 mins PT General Charges $$ ACUTE PT VISIT: 1 Visit        4:02 PM, 02/03/24,  Braylynn Ghan, SPT

## 2024-02-03 NOTE — Progress Notes (Signed)
 PROGRESS NOTE  Victor Shields FMW:[id number] DOB: [birth year] DOA: 01/27/2024 PCP: Lavell Bari LABOR, FNP  Brief History:  73 year old male with hypertension, hyperlipidemia, COPD, ongoing tobacco use, presented to the emergency department with acute onset shortness of breath and peripheral edema.  EMS found him to be hypoxic at home with oxygen saturation in the mid 80s.  He was wheezing and coughing.  His baseline supplemental oxygen was increased to 3 L with some improvement in oxygen saturation to the mid 90s.  He was given 125 mg of Solu-Medrol  IV by EMS.  He says that he was recently started on Lasix  by his PCP and after starting it was having symptoms of dizziness.  Over the past 2 to 3 days he has had progressive shortness of breath wheezing coughing and weakness.  Patient reports shortness of breath with minimal exertion which is not his baseline.  He has some shortness of breath symptoms but no fever chills or chest pain.  His chest x-ray showed findings of a large right pleural effusion with compressive atelectasis of the right lung and possible pneumonia.  He had an elevated BNP.  White blood cell count 11.9.  Respiratory panel negative for influenza RSV and SARS coronavirus 2.  Hospital admission was requested for management. Patient was started on IV Solu-Medrol  and bronchodilators with gradual improvement.  He was weaned off oxygen.  He underwent thoracocentesis removing 1.5 L.  He was given a dose of IV furosemide  for his fluid overload.  Unfortunately, the patient developed atrial fibrillation with RVR on the evening of 01/29/2024.  He was placed on diltiazem  drip.  His rates remained elevated in the 140s.  Cardiology was consulted to assist with management.   Assessment/Plan: Acute respiratory failure with hypoxia -- Secondary to large right pleural effusion -- Secondary to acute COPD exacerbation and CHF -- thoracentesis completed 7/21 and 1.5 L serous fluid removed, fluid  sent for testing  -- added incentive spirometry 7/22 - initially on 2L>>RA - repeat CXR am 7/28--unchanged right pleural eff since thora - remains stable on RA   Acute COPD exacerbation -- continue IV solumedrol -change albuterol  to xopenex  -continue pulmicort  -continue brovana  -added yupelri  -- added flutter valve   Acute HFpEF -7/21 Echo EF 60-65%, no wMA, normal RVF, mod elevated PASP -s/p thora 7/21>>1.5 L removed>>transudative -remains clinically fluid overloaded -lasix  IV 40 x 1 on 7/23, 7/24, 7/25 -lasix  60 IV on 7/26 and 7/27 -7/28--transition to po lasix  -daily weights -I/Os incomplete   Atrial fibrillation with RVR -started on diltiazem  drip 7/23>>po diltiazem  7/25 -started amio drip on 7/24>>po amio on 7/28 -cardiology consult appreciated -01/02/24 TSH 1.330 -CHADSVASc = 3--start apixaban  -given digoxin  x 3 on 7/24 - keep Mag >2 and K >4 - converted to sinus 7/27 at 0226   Tobacco -- Nicotine  patch ordered for cravings --cessation discussed   Mixed Hyperlipidemia -continue statin         Family Communication:   spouse at bedside 7/28   Consultants:  none   Code Status:  FULL    DVT Prophylaxis:  apixaban      Procedures: As Listed in Progress Note Above   Antibiotics: Ceftriaxone  7/21>>7/25 Doxy 7/21>>7/25            Subjective: Patient denies fevers, chills, headache, chest pain, dyspnea, nausea, vomiting, diarrhea, abdominal pain, dysuria, hematuria, hematochezia, and melena.   Objective: Vitals:   02/03/24 1300 02/03/24 1400 02/03/24 1530 02/03/24 1600  BP: (!) 149/46 (!) 108/46 (!) 132/39 (!) 134/42  Pulse: 62 72 74 67  Resp: 13 18 20 19   Temp: 98.2 F (36.8 C)     TempSrc: Oral     SpO2: 97% 97% 98% 97%  Weight:      Height:        Intake/Output Summary (Last 24 hours) at 02/03/2024 1714 Last data filed at 02/03/2024 1600 Gross per 24 hour  Intake 419.76 ml  Output 1180 ml  Net -760.24 ml   Weight change:   Exam:  General:  Pt is alert, follows commands appropriately, not in acute distress HEENT: No icterus, No thrush, No neck mass, [place]/AT Cardiovascular: RRR, S1/S2, no rubs, no gallops Respiratory: bibasilar rales.  Diminished BS right base Abdomen: Soft/+BS, non tender, non distended, no guarding Extremities: 1 + LE edema, No lymphangitis, No petechiae, No rashes, no synovitis   Data Reviewed: I have personally reviewed following labs and imaging studies Basic Metabolic Panel: Recent Labs  Lab 01/30/24 0427 01/31/24 0420 02/01/24 0500 02/02/24 0606 02/03/24 0439  NA 139 136 136 135 133*  K 3.9 3.9 4.0 4.2 3.6  CL 105 104 101 95* 94*  CO2 25 23 26 31 31   GLUCOSE 151* 158* 154* 158* 205*  BUN 23 26* 24* 23 23  CREATININE 0.60* 0.73 0.69 0.75 0.68  CALCIUM  8.9 8.5* 8.5* 8.3* 8.4*  MG 2.0 1.9 1.8 2.1 2.0  PHOS  --   --   --  2.9 2.5   Liver Function Tests: No results for input(s): AST, ALT, ALKPHOS, BILITOT, PROT, ALBUMIN in the last 168 hours. No results for input(s): LIPASE, AMYLASE in the last 168 hours. No results for input(s): AMMONIA in the last 168 hours. Coagulation Profile: No results for input(s): INR, PROTIME in the last 168 hours. CBC: Recent Labs  Lab 01/28/24 0449 01/29/24 0444 01/30/24 0427  WBC 9.5 19.3* 17.5*  NEUTROABS 8.5* 17.3* 15.8*  HGB 13.3 12.3* 15.5  HCT 40.8 36.5* 46.7  MCV 97.1 93.6 94.5  PLT 249 PLATELET CLUMPS NOTED ON SMEAR, UNABLE TO ESTIMATE 334   Cardiac Enzymes: No results for input(s): CKTOTAL, CKMB, CKMBINDEX, TROPONINI in the last 168 hours. BNP: Invalid input(s): POCBNP CBG: No results for input(s): GLUCAP in the last 168 hours. HbA1C: No results for input(s): HGBA1C in the last 72 hours. Urine analysis:    Component Value Date/Time   APPEARANCEUR Cloudy (A) 05/19/2021 1459   GLUCOSEU Negative 05/19/2021 1459   BILIRUBINUR Negative 05/19/2021 1459   PROTEINUR 1+ (A) 05/19/2021 1459    NITRITE Negative 05/19/2021 1459   LEUKOCYTESUR Negative 05/19/2021 1459   Sepsis Labs: @LABRCNTIP (procalcitonin:4,lacticidven:4) ) Recent Results (from the past 240 hours)  Resp panel by RT-PCR (RSV, Flu A&B, Covid) Anterior Nasal Swab     Status: None   Collection Time: 01/27/24  9:15 AM   Specimen: Anterior Nasal Swab  Result Value Ref Range Status   SARS Coronavirus 2 by RT PCR NEGATIVE NEGATIVE Final    Comment: (NOTE) SARS-CoV-2 target nucleic acids are NOT DETECTED.  The SARS-CoV-2 RNA is generally detectable in upper respiratory specimens during the acute phase of infection. The lowest concentration of SARS-CoV-2 viral copies this assay can detect is 138 copies/mL. A negative result does not preclude SARS-Cov-2 infection and should not be used as the sole basis for treatment or other patient management decisions. A negative result may occur with  improper specimen collection/handling, submission of specimen other than nasopharyngeal swab, presence of viral mutation(s)  within the areas targeted by this assay, and inadequate number of viral copies(<138 copies/mL). A negative result must be combined with clinical observations, patient history, and epidemiological information. The expected result is Negative.  Fact Sheet for Patients:  BloggerCourse.com  Fact Sheet for Healthcare Providers:  SeriousBroker.it  This test is no t yet approved or cleared by the [place]  FDA and  has been authorized for detection and/or diagnosis of SARS-CoV-2 by FDA under an Emergency Use Authorization (EUA). This EUA will remain  in effect (meaning this test can be used) for the duration of the COVID-19 declaration under Section 564(b)(1) of the Act, 21 U.S.C.section 360bbb-3(b)(1), unless the authorization is terminated  or revoked sooner.       Influenza A by PCR NEGATIVE NEGATIVE Final   Influenza B by PCR NEGATIVE NEGATIVE Final     Comment: (NOTE) The Xpert Xpress SARS-CoV-2/FLU/RSV plus assay is intended as an aid in the diagnosis of influenza from Nasopharyngeal swab specimens and should not be used as a sole basis for treatment. Nasal washings and aspirates are unacceptable for Xpert Xpress SARS-CoV-2/FLU/RSV testing.  Fact Sheet for Patients: BloggerCourse.com  Fact Sheet for Healthcare Providers: SeriousBroker.it  This test is not yet approved or cleared by the [place]  FDA and has been authorized for detection and/or diagnosis of SARS-CoV-2 by FDA under an Emergency Use Authorization (EUA). This EUA will remain in effect (meaning this test can be used) for the duration of the COVID-19 declaration under Section 564(b)(1) of the Act, 21 U.S.C. section 360bbb-3(b)(1), unless the authorization is terminated or revoked.     Resp Syncytial Virus by PCR NEGATIVE NEGATIVE Final    Comment: (NOTE) Fact Sheet for Patients: BloggerCourse.com  Fact Sheet for Healthcare Providers: SeriousBroker.it  This test is not yet approved or cleared by the [place]  FDA and has been authorized for detection and/or diagnosis of SARS-CoV-2 by FDA under an Emergency Use Authorization (EUA). This EUA will remain in effect (meaning this test can be used) for the duration of the COVID-19 declaration under Section 564(b)(1) of the Act, 21 U.S.C. section 360bbb-3(b)(1), unless the authorization is terminated or revoked.  Performed at [facility], [address], [place], [place] [address]   Culture, blood (Routine X 2) w Reflex to ID Panel     Status: None   Collection Time: 01/27/24 10:39 AM   Specimen: BLOOD  Result Value Ref Range Status   Specimen Description BLOOD BLOOD RIGHT ARM  Final   Special Requests   Final    BOTTLES DRAWN AEROBIC AND ANAEROBIC Blood Culture adequate volume   Culture   Final    NO  GROWTH 5 DAYS Performed at [facility], [address], [place], [place] [address]    Report Status 02/01/2024 FINAL  Final  Culture, blood (Routine X 2) w Reflex to ID Panel     Status: None   Collection Time: 01/27/24 10:45 AM   Specimen: BLOOD  Result Value Ref Range Status   Specimen Description BLOOD BLOOD LEFT ARM  Final   Special Requests   Final    BOTTLES DRAWN AEROBIC ONLY Blood Culture results may not be optimal due to an inadequate volume of blood received in culture bottles   Culture   Final    NO GROWTH 5 DAYS Performed at [facility], [address], [place], [place] [address]    Report Status 02/01/2024 FINAL  Final  MRSA Next Gen by PCR, Nasal     Status: None  Collection Time: 01/29/24  7:00 PM   Specimen: Nasal Mucosa; Nasal Swab  Result Value Ref Range Status   MRSA by PCR Next Gen NOT DETECTED NOT DETECTED Final    Comment: (NOTE) The GeneXpert MRSA Assay (FDA approved for NASAL specimens only), is one component of a comprehensive MRSA colonization surveillance program. It is not intended to diagnose MRSA infection nor to guide or monitor treatment for MRSA infections. Test performance is not FDA approved in patients less than 70 years old. Performed at [facility], [address], [place], [place] [address]      Scheduled Meds:  amiodarone   200 mg Oral BID   apixaban   5 mg Oral BID   arformoterol   15 mcg Nebulization BID   budesonide  (PULMICORT ) nebulizer solution  0.5 mg Nebulization BID   Chlorhexidine  Gluconate Cloth  6 each Topical Q0600   clotrimazole   10 mg Oral 5 X Daily   diltiazem   240 mg Oral Daily   fluticasone   2 spray Each Nare Daily   furosemide   40 mg Oral BID   guaiFENesin   15 mL Oral QID   levalbuterol   0.63 mg Nebulization BID   loratadine   10 mg Oral Daily   methylPREDNISolone  (SOLU-MEDROL ) injection  60 mg Intravenous BID   nicotine   14 mg Transdermal Daily   polyethylene glycol  17 g Oral Daily   revefenacin   175 mcg  Nebulization Daily   rosuvastatin   5 mg Oral Daily   Continuous Infusions:  Procedures/Studies: DG CHEST PORT 1 VIEW Result Date: 02/03/2024 CLINICAL DATA:  [id number].  Right pleural effusion. EXAM: PORTABLE CHEST 1 VIEW COMPARISON:  Portable chest 01/28/2024 FINDINGS: 5:10 a.m. Again noted are a small left and moderate right pleural effusions, on the right with overlying atelectasis or consolidation nearly to the mid hilum. Rest of the lungs are generally clear. The cardiac size is normal. Mediastinum is normally outlined. There is aortic atherosclerosis. Osteopenia and thoracic spondylosis. Overall aeration seems unchanged. IMPRESSION: Small left and moderate right pleural effusions, on the right with overlying atelectasis or consolidation nearly to the mid hilum. No significant change. Stable overall aeration. Electronically Signed   By: Francis Quam M.D.   On: 02/03/2024 06:52   CT CHEST WO CONTRAST Result Date: 01/30/2024 CLINICAL DATA:  Rhonchi and wheezing.  Decreased breath sounds. EXAM: CT CHEST WITHOUT CONTRAST TECHNIQUE: Multidetector CT imaging of the chest was performed following the standard protocol without IV contrast. RADIATION DOSE REDUCTION: This exam was performed according to the departmental dose-optimization program which includes automated exposure control, adjustment of the mA and/or kV according to patient size and/or use of iterative reconstruction technique. COMPARISON:  January 28, 2024. FINDINGS: Cardiovascular: Atherosclerosis of thoracic aorta is noted without aneurysm formation. Normal cardiac size. No pericardial effusion. Coronary artery calcifications are noted. Mediastinum/Nodes: No enlarged mediastinal or axillary lymph nodes. Thyroid  gland, trachea, and esophagus demonstrate no significant findings. Lungs/Pleura: Mild to moderate right pleural effusion is noted. Small left pleural effusion is noted. No pneumothorax is noted. Emphysematous disease is noted. Right lower  lobe and middle lobe opacities are noted concerning for pneumonia or atelectasis. Upper Abdomen: Possible nodular hepatic contours are noted suggesting cirrhosis. Musculoskeletal: No chest wall mass or suspicious bone lesions identified. IMPRESSION: Bilateral pleural effusions as noted above, right greater than left. Right lower lobe and middle lobe opacities are noted concerning for pneumonia or atelectasis. Coronary artery calcifications are noted. 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 Aortic Atherosclerosis (ICD10-I70.0) and Emphysema (ICD10-J43.9). Electronically Signed   By: Lynwood  Landy Raddle M.D.   On: 01/30/2024 16:08   Portable chest 1 View Result Date: 01/28/2024 CLINICAL DATA:  Pleural effusion. EXAM: PORTABLE CHEST 1 VIEW COMPARISON:  01/27/2024 FINDINGS: Right base collapse/consolidation with effusion is similar to prior. Left lung is clear. Interstitial markings are diffusely coarsened with chronic features. Cardiopericardial silhouette is at upper limits of normal for size. No acute bony abnormality. IMPRESSION: Right base collapse/consolidation with effusion, similar to prior. Electronically Signed   By: Camellia Candle M.D.   On: 01/28/2024 06:37   ECHOCARDIOGRAM COMPLETE Result Date: 01/27/2024    ECHOCARDIOGRAM REPORT   Patient Name:   Victor Shields Date of Exam: 01/27/2024 Medical Rec #:  [id number]           Height:       68.0 in Accession #:    [id number]          Weight:       135.4 lb Date of Birth:  [birth year]           BSA:          1.731 m Patient Age:    73 years            BP:           159/72 mmHg Patient Gender: M                   HR:           110 bpm. Exam Location:  [facility] Procedure: 2D Echo, Cardiac Doppler and Color Doppler (Both Spectral and Color            Flow Doppler were utilized during procedure). Indications:    dyspnea  History:        Patient has no prior history of Echocardiogram examinations.                 COPD; Risk Factors:Current Smoker.  Sonographer:    Benard Stallion Referring Phys: [phone] CLANFORD L JOHNSON IMPRESSIONS  1. Left ventricular ejection fraction, by estimation, is 60 to 65%. The left ventricle has normal function. The left ventricle has no regional wall motion abnormalities. Left ventricular diastolic function could not be evaluated.  2. Right ventricular systolic function is normal. The right ventricular size is normal. There is moderately elevated pulmonary artery systolic pressure.  3. The mitral valve is normal in structure. Trivial mitral valve regurgitation. No evidence of mitral stenosis.  4. The aortic valve is normal in structure. Aortic valve regurgitation is not visualized. No aortic stenosis is present.  5. The inferior vena cava is normal in size with greater than 50% respiratory variability, suggesting right atrial pressure of 3 mmHg. FINDINGS  Left Ventricle: Left ventricular ejection fraction, by estimation, is 60 to 65%. The left ventricle has normal function. The left ventricle has no regional wall motion abnormalities. The left ventricular internal cavity size was normal in size. There is  no left ventricular hypertrophy. Left ventricular diastolic function could not be evaluated. Right Ventricle: The right ventricular size is normal. No increase in right ventricular wall thickness. Right ventricular systolic function is normal. There is moderately elevated pulmonary artery systolic pressure. The tricuspid regurgitant velocity is 3.30 m/s, and with an assumed right atrial pressure of 3 mmHg, the estimated right ventricular systolic pressure is 46.6 mmHg. Left Atrium: Left atrial size was normal in size. Right Atrium: Right atrial size was normal in size. Pericardium: There is no evidence of pericardial effusion. Mitral Valve:  The mitral valve is normal in structure. Trivial mitral valve regurgitation. No evidence of mitral valve stenosis. Tricuspid Valve: The tricuspid valve is normal in structure. Tricuspid valve regurgitation is mild .  No evidence of tricuspid stenosis. Aortic Valve: The aortic valve is normal in structure. Aortic valve regurgitation is not visualized. No aortic stenosis is present. Aortic valve mean gradient measures 3.0 mmHg. Aortic valve peak gradient measures 6.5 mmHg. Aortic valve area, by VTI measures 3.31 cm. Pulmonic Valve: The pulmonic valve was normal in structure. Pulmonic valve regurgitation is not visualized. No evidence of pulmonic stenosis. Aorta: The aortic root is normal in size and structure. Venous: The inferior vena cava is normal in size with greater than 50% respiratory variability, suggesting right atrial pressure of 3 mmHg. IAS/Shunts: No atrial level shunt detected by color flow Doppler.  LEFT VENTRICLE PLAX 2D LVIDd:         4.95 cm   Diastology LVIDs:         3.70 cm   LV e' medial:  7.51 cm/s LV PW:         0.85 cm   LV e' lateral: 9.25 cm/s LV IVS:        0.85 cm LVOT diam:     2.20 cm LV SV:         65 LV SV Index:   37 LVOT Area:     3.80 cm  RIGHT VENTRICLE RV Basal diam:  3.25 cm RV Mid diam:    3.15 cm RV S prime:     18.60 cm/s TAPSE (M-mode): 2.4 cm LEFT ATRIUM           Index        RIGHT ATRIUM           Index LA Vol (A4C): 67.0 ml 38.70 ml/m  RA Area:     12.30 cm                                    RA Volume:   32.30 ml  18.66 ml/m  AORTIC VALVE AV Area (Vmax):    3.35 cm AV Area (Vmean):   3.30 cm AV Area (VTI):     3.31 cm AV Vmax:           127.00 cm/s AV Vmean:          83.400 cm/s AV VTI:            0.195 m AV Peak Grad:      6.5 mmHg AV Mean Grad:      3.0 mmHg LVOT Vmax:         112.00 cm/s LVOT Vmean:        72.300 cm/s LVOT VTI:          0.170 m LVOT/AV VTI ratio: 0.87  AORTA Ao Root diam: 3.20 cm TRICUSPID VALVE TR Peak grad:   43.6 mmHg TR Vmax:        330.00 cm/s  SHUNTS Systemic VTI:  0.17 m Systemic Diam: 2.20 cm Wilbert Bihari MD Electronically signed by Wilbert Bihari MD Signature Date/Time: 01/27/2024/6:41:20 PM    Final    US  THORACENTESIS ASP PLEURAL SPACE W/IMG  GUIDE Result Date: 01/27/2024 INDICATION: Patient with history of COPD admitted with dyspnea and found to have large right pleural effusion. Request for diagnostic and therapeutic right thoracentesis. EXAM: ULTRASOUND GUIDED RIGHT THORACENTESIS MEDICATIONS: 6 mL 1% lidocaine  COMPLICATIONS: None  immediate. PROCEDURE: An ultrasound guided thoracentesis was thoroughly discussed with the patient and questions answered. The benefits, risks, alternatives and complications were also discussed. The patient understands and wishes to proceed with the procedure. Written consent was obtained. Ultrasound was performed to localize and mark an adequate pocket of fluid in the right chest. The area was then prepped and draped in the normal sterile fashion. 1% Lidocaine  was used for local anesthesia. Under ultrasound guidance a 6 Fr Safe-T-Centesis catheter was introduced. Thoracentesis was performed. The catheter was removed and a dressing applied. FINDINGS: A total of approximately 1.5 L of clear yellow fluid was removed. Samples were sent to the laboratory as requested by the clinical team. IMPRESSION: Successful ultrasound guided right thoracentesis yielding 1.5 L of pleural fluid. Performed by Clotilda Hesselbach, PA-C Electronically Signed   By: Ester Sides M.D.   On: 01/27/2024 12:55   DG Chest 1 View Result Date: 01/27/2024 CLINICAL DATA:  Post thoracentesis. EXAM: CHEST  1 VIEW COMPARISON:  Radiographs 01/27/2024 and 02/15/2020. FINDINGS: 1129 hours. Interval decreased right pleural effusion with improved aeration of the right lung base. No definite pneumothorax. The heart size and mediastinal contours are stable with mild mediastinal shift to the right. The left lung appears clear. The bones appear unremarkable. IMPRESSION: Interval decreased right pleural effusion with improved aeration of the right lung base but incomplete re-expansion of the right lung. No definite pneumothorax. Electronically Signed   By: Elsie Perone M.D.   On: 01/27/2024 11:59   DG Chest Port 1 View Result Date: 01/27/2024 CLINICAL DATA:  Shortness of breath, wheezing, low O2 sats. EXAM: PORTABLE CHEST 1 VIEW COMPARISON:  02/15/2020. FINDINGS: Trachea is midline. Heart size stable. Large right pleural effusion with right basilar collapse/consolidation. Streaky atelectasis in the left lung base. IMPRESSION: Large right pleural effusion with collapse/consolidation in the right lung base, possibly due to pneumonia. Difficult exclude underlying malignancy. Followup PA and lateral chest X-ray is recommended in 3-4 weeks following trial of antibiotic therapy to ensure resolution and exclude underlying malignancy. Electronically Signed   By: Newell Eke M.D.   On: 01/27/2024 09:25    Alm Schneider, DO  [facility]  If 7PM-7AM, please contact night-coverage [email or web address] Password [email or web address] 02/03/2024, 5:14 PM   LOS: 7 days

## 2024-02-04 DIAGNOSIS — R0602 Shortness of breath: Secondary | ICD-10-CM | POA: Diagnosis not present

## 2024-02-04 DIAGNOSIS — I4891 Unspecified atrial fibrillation: Secondary | ICD-10-CM | POA: Diagnosis not present

## 2024-02-04 DIAGNOSIS — F17200 Nicotine dependence, unspecified, uncomplicated: Secondary | ICD-10-CM | POA: Diagnosis not present

## 2024-02-04 DIAGNOSIS — I5031 Acute diastolic (congestive) heart failure: Secondary | ICD-10-CM | POA: Diagnosis not present

## 2024-02-04 DIAGNOSIS — I480 Paroxysmal atrial fibrillation: Secondary | ICD-10-CM | POA: Diagnosis not present

## 2024-02-04 DIAGNOSIS — J189 Pneumonia, unspecified organism: Secondary | ICD-10-CM | POA: Diagnosis not present

## 2024-02-04 DIAGNOSIS — J9601 Acute respiratory failure with hypoxia: Secondary | ICD-10-CM | POA: Diagnosis not present

## 2024-02-04 DIAGNOSIS — J90 Pleural effusion, not elsewhere classified: Secondary | ICD-10-CM | POA: Diagnosis not present

## 2024-02-04 DIAGNOSIS — R918 Other nonspecific abnormal finding of lung field: Secondary | ICD-10-CM | POA: Diagnosis not present

## 2024-02-04 LAB — BASIC METABOLIC PANEL WITH GFR
Anion gap: 11 (ref 5–15)
BUN: 22 mg/dL (ref 8–23)
CO2: 31 mmol/L (ref 22–32)
Calcium: 8.2 mg/dL — ABNORMAL LOW (ref 8.9–10.3)
Chloride: 90 mmol/L — ABNORMAL LOW (ref 98–111)
Creatinine, Ser: 0.76 mg/dL (ref 0.61–1.24)
GFR, Estimated: >60 mL/min (ref >60)
Glucose, Bld: 196 mg/dL — ABNORMAL HIGH (ref 70–99)
Potassium: 3.5 mmol/L (ref 3.5–5.1)
Sodium: 132 mmol/L — ABNORMAL LOW (ref 135–145)

## 2024-02-04 LAB — MAGNESIUM: Magnesium: 2 mg/dL (ref 1.7–2.4)

## 2024-02-04 LAB — PHOSPHORUS: Phosphorus: 3 mg/dL (ref 2.5–4.6)

## 2024-02-04 MED ORDER — LEVALBUTEROL HCL 0.63 MG/3ML IN NEBU: 0.6300 mg | INHALATION_SOLUTION | Freq: Two times a day (BID) | RESPIRATORY_TRACT | Status: DC

## 2024-02-04 MED ORDER — FUROSEMIDE 40 MG PO TABS: 60.0000 mg | ORAL_TABLET | Freq: Two times a day (BID) | ORAL | Status: DC

## 2024-02-04 MED ORDER — DILTIAZEM HCL ER COATED BEADS 240 MG PO CP24: 240.0000 mg | ORAL_CAPSULE | Freq: Every day | ORAL | Status: DC

## 2024-02-04 MED ORDER — PREDNISONE 10 MG PO TABS: 60.0000 mg | ORAL_TABLET | Freq: Every day | ORAL | Status: DC

## 2024-02-04 MED ORDER — FUROSEMIDE 20 MG PO TABS: 60.0000 mg | ORAL_TABLET | Freq: Two times a day (BID) | ORAL | Status: DC

## 2024-02-04 MED ORDER — APIXABAN 5 MG PO TABS: 5.0000 mg | ORAL_TABLET | Freq: Two times a day (BID) | ORAL | Status: DC

## 2024-02-04 MED ORDER — PREDNISONE 20 MG PO TABS: 60.0000 mg | ORAL_TABLET | Freq: Every day | ORAL | Status: DC

## 2024-02-04 MED ORDER — AMIODARONE HCL 200 MG PO TABS: 200.0000 mg | ORAL_TABLET | Freq: Two times a day (BID) | ORAL | Status: DC

## 2024-02-04 NOTE — Plan of Care (Signed)

## 2024-02-04 NOTE — Progress Notes (Signed)
 Physical Therapy Treatment Patient Details Name: Victor Shields MRN: [id number] DOB: [birth year] Today's Date: 02/04/2024   History of Present Illness 73 year old male with hypertension, hyperlipidemia, COPD, ongoing tobacco use, presented to the emergency department with acute onset shortness of breath and peripheral edema.  EMS found him to be hypoxic at home with oxygen saturation in the mid 80s.  He was wheezing and coughing.  His baseline supplemental oxygen was increased to 3 L with some improvement in oxygen saturation to the mid 90s.  He was given 125 mg of Solu-Medrol  IV by EMS.  He says that he was recently started on Lasix  by his PCP and after starting it was having symptoms of dizziness.  Over the past 2 to 3 days he has had progressive shortness of breath wheezing coughing and weakness.  Patient reports shortness of breath with minimal exertion which is not his baseline.  He has some shortness of breath symptoms but no fever chills or chest pain.  His chest x-ray showed findings of a large right pleural effusion with compressive atelectasis of the right lung and possible pneumonia.  He had an elevated BNP.  White blood cell count 11.9.  Respiratory panel negative for influenza RSV and SARS coronavirus 2.  Hospital admission was requested for management.  Patient was started on IV Solu-Medrol  and bronchodilators with gradual improvement.  He was weaned off oxygen.  He underwent thoracocentesis removing 1.5 L.  He was given a dose of IV furosemide  for his fluid overload.  Unfortunately, the patient developed atrial fibrillation with RVR on the evening of 01/29/2024.  He was placed on diltiazem  drip.  His rates remained elevated in the 140s.  Cardiology was consulted to assist with management    PT Comments  Patient agreeable to physical therapy treatment. Patient received on RA. Patient demonstrates slow labored movement for sitting up at bedside secondary to core and UE weakness requiring the  use of hand rails and handheld assist to complete the transfer with HOB elevated. Patient demonstrates increased LE weakness requiring multiple attempts to complete sit to stand and very unsteady once on his feet with the use of RW. Patient currently needs frequent rest breaks to complete functional mobility tasks. Patient will benefit from continued skilled physical therapy in hospital and recommended venue below to increase strength, balance, endurance for safe ADLs and gait.     If plan is discharge home, recommend the following: A little help with walking and/or transfers;A little help with bathing/dressing/bathroom;Help with stairs or ramp for entrance;Assist for transportation;Assistance with cooking/housework   Can travel by private vehicle     No  Equipment Recommendations  None recommended by PT    Recommendations for Other Services       Precautions / Restrictions Precautions Precautions: Fall Recall of Precautions/Restrictions: Intact Restrictions Weight Bearing Restrictions Per Provider Order: No     Mobility  Bed Mobility Overal bed mobility: Needs Assistance Bed Mobility: Supine to Sit     Supine to sit: Min assist, HOB elevated, Used rails     General bed mobility comments: handheld assist from SPT to pull to sit, hob elevated, use of handrails    Transfers Overall transfer level: Needs assistance Equipment used: Rolling walker (2 wheels), 1 person hand held assist Transfers: Sit to/from Stand, Bed to chair/wheelchair/BSC Sit to Stand: Min assist   Step pivot transfers: Contact guard assist       General transfer comment: very unsteay, relies on walker, fatigue; multiple attempts for STS  Ambulation/Gait Ambulation/Gait assistance: Min assist, Contact guard assist Gait Distance (Feet): 5 Feet Assistive device: Rolling walker (2 wheels) Gait Pattern/deviations: Trunk flexed, Decreased step length - right, Decreased step length - left Gait velocity:  decreased     General Gait Details: slow labored movement, limited to side steps at bedside   Stairs             Wheelchair Mobility     Tilt Bed    Modified Rankin (Stroke Patients Only)       Balance Overall balance assessment: Needs assistance Sitting-balance support: Feet supported, No upper extremity supported Sitting balance-Leahy Scale: Fair Sitting balance - Comments: fair/good seated EOB to complete ther ex   Standing balance support: During functional activity, Reliant on assistive device for balance, Bilateral upper extremity supported Standing balance-Leahy Scale: Poor                              Communication Communication Communication: Impaired Factors Affecting Communication: Hearing impaired  Cognition Arousal: Alert Behavior During Therapy: WFL for tasks assessed/performed   PT - Cognitive impairments: No apparent impairments                         Following commands: Intact      Cueing Cueing Techniques: Verbal cues, Tactile cues  Exercises General Exercises - Lower Extremity Long Arc Quad: AROM, 10 reps, Seated, Both Hip Flexion/Marching: 10 reps, Both, Seated, AROM Toe Raises: 10 reps, AROM, Seated, Both Heel Raises: 10 reps, AROM, Seated, Both    General Comments        Pertinent Vitals/Pain Pain Assessment Pain Assessment: Faces Faces Pain Scale: Hurts a little bit Pain Location: headache Pain Descriptors / Indicators: Aching Pain Intervention(s): Monitored during session    Home Living                          Prior Function            PT Goals (current goals can now be found in the care plan section) Acute Rehab PT Goals Patient Stated Goal: return home with family to assist PT Goal Formulation: With patient/family Time For Goal Achievement: 02/17/24 Potential to Achieve Goals: Good Progress towards PT goals: Progressing toward goals    Frequency    Min 3X/week      PT  Plan      Co-evaluation              AM-PAC PT 6 Clicks Mobility   Outcome Measure  Help needed turning from your back to your side while in a flat bed without using bedrails?: None Help needed moving from lying on your back to sitting on the side of a flat bed without using bedrails?: A Little Help needed moving to and from a bed to a chair (including a wheelchair)?: A Little Help needed standing up from a chair using your arms (e.g., wheelchair or bedside chair)?: A Lot Help needed to walk in hospital room?: A Lot Help needed climbing 3-5 steps with a railing? : A Lot 6 Click Score: 16    End of Session Equipment Utilized During Treatment: Gait belt Activity Tolerance: Patient limited by fatigue Patient left: in chair;with call bell/phone within reach;with family/visitor present Nurse Communication: Mobility status PT Visit Diagnosis: Unsteadiness on feet (R26.81);Other abnormalities of gait and mobility (R26.89);Muscle weakness (generalized) (M62.81)     Time:  8954-8894 PT Time Calculation (min) (ACUTE ONLY): 20 min  Charges:    $Therapeutic Exercise: 8-22 mins $Therapeutic Activity: 8-22 mins PT General Charges $$ ACUTE PT VISIT: 1 Visit                     12:21 PM, 02/04/24,  Onnie Como, SPT

## 2024-02-04 NOTE — Progress Notes (Signed)
 Mobility Specialist Progress Note:    02/04/24 1212  Mobility  Activity Pivoted/transferred from chair to bed  Level of Assistance Contact guard assist, steadying assist  Assistive Device None  Distance Ambulated (ft) 2 ft  Range of Motion/Exercises Active;All extremities  Activity Response Tolerated well  Mobility Referral Yes  Mobility visit 1 Mobility  Mobility Specialist Start Time (ACUTE ONLY) 1200  Mobility Specialist Stop Time (ACUTE ONLY) 1212  Mobility Specialist Time Calculation (min) (ACUTE ONLY) 12 min   Pt received requesting assistance to bed. Required CGA to stand and pivot with no AD. Tolerated well, asx throughout. Alarm on, family at bedside. All needs met.   Sherrilee Ditty Mobility Specialist Please contact via Special educational needs teacher or  Rehab office at [phone]

## 2024-02-04 NOTE — TOC Progression Note (Signed)
 Transition of Care [facility]) - Progression Note    Patient Details  Name: Victor Shields MRN: [id number] Date of Birth: [birth year]  Transition of Care [facility]) CM/SW Contact  Hoy DELENA Bigness, LCSW Phone Number: 02/04/2024, 10:30 AM  Clinical Narrative:    Met w/ pt and spouse at bedside to review bed offers for SNF. Pt has accepted offer for SNF at [facility]. Insurance shara has been requested and is currently pending.   [facility] [address] [place], [place] [address] [phone] Overall rating ?????  [facility] [address] [place], [place] [address] [phone] Overall rating ?????  [facility] [address] [place], [place] [address] [phone] Overall rating ?   Expected Discharge Plan: Home w Home Health Services Barriers to Discharge: No Barriers Identified               Expected Discharge Plan and Services In-house Referral: Clinical Social Work Discharge Planning Services: NA Post Acute Care Choice: Home Health Living arrangements for the past 2 months: Single Family Home                           HH Arranged: PT HH Agency: [facility] Date HH Agency Contacted: 01/29/24 Time HH Agency Contacted: 1435 Representative spoke with at [facility] Agency: [place]   Social Drivers of Health (SDOH) Interventions SDOH Screenings   Food Insecurity: No Food Insecurity (01/27/2024)  Housing: Low Risk  (01/27/2024)  Transportation Needs: No Transportation Needs (01/27/2024)  Utilities: Not At Risk (01/27/2024)  Alcohol Screen: Low Risk  (12/03/2023)  Depression (PHQ2-9): Low Risk  (01/02/2024)  Financial Resource Strain: Low Risk  (12/03/2023)  Physical Activity: Insufficiently Active (12/03/2023)  Social Connections: Moderately Isolated (01/27/2024)  Stress: No Stress Concern Present (12/03/2023)  Tobacco Use: High Risk (01/27/2024)  Health Literacy:  Adequate Health Literacy (12/03/2023)    Readmission Risk Interventions    01/28/2024   12:30 PM  Readmission Risk Prevention Plan  Post Dischage Appt Complete  Medication Screening Complete  Transportation Screening Complete

## 2024-02-04 NOTE — TOC Transition Note (Signed)
 Transition of Care [facility]) - Discharge Note   Patient Details  Name: Victor Shields MRN: [id number] Date of Birth: 09-Aug-1950  Transition of Care [facility]) CM/SW Contact:  Hoy DELENA Bigness, LCSW Phone Number: 02/04/2024, 5:30 PM   Clinical Narrative:    Pt discharging to [facility] for STR. Pt going to room 410-2. RN to call report to [phone]. Med necessity form has been printed to the unit and RN notified.   Final next level of care: Skilled Nursing Facility Barriers to Discharge: No Barriers Identified   Patient Goals and CMS Choice Patient states their goals for this hospitalization and ongoing recovery are:: To return home CMS Medicare.gov Compare Post Acute Care list provided to:: Patient Choice offered to / list presented to : Patient [place] ownership interest in [facility]provided to:: Patient    Discharge Placement   Existing PASRR number confirmed : 02/03/24          Patient chooses bed at: [facility] Patient to be transferred to facility by: [facility] Name of family member notified: Pt and spouse Patient and family notified of of transfer: 02/04/24  Discharge Plan and Services Additional resources added to the After Visit Summary for   In-house Referral: Clinical Social Work Discharge Planning Services: NA Post Acute Care Choice: Home Health          DME Arranged: N/A DME Agency: NA       HH Arranged: PT HH Agency: [facility] Date HH Agency Contacted: 01/29/24 Time HH Agency Contacted: 1435 Representative spoke with at [facility] Agency: [place]  Social Drivers of Health (SDOH) Interventions SDOH Screenings   Food Insecurity: No Food Insecurity (01/27/2024)  Housing: Low Risk  (01/27/2024)  Transportation Needs: No Transportation Needs (01/27/2024)  Utilities: Not At Risk (01/27/2024)  Alcohol Screen: Low Risk  (12/03/2023)  Depression (PHQ2-9): Low Risk  (01/02/2024)  Financial Resource Strain: Low Risk  (12/03/2023)   Physical Activity: Insufficiently Active (12/03/2023)  Social Connections: Moderately Isolated (01/27/2024)  Stress: No Stress Concern Present (12/03/2023)  Tobacco Use: High Risk (01/27/2024)  Health Literacy: Adequate Health Literacy (12/03/2023)     Readmission Risk Interventions    01/28/2024   12:30 PM  Readmission Risk Prevention Plan  Post Dischage Appt Complete  Medication Screening Complete  Transportation Screening Complete

## 2024-02-04 NOTE — Progress Notes (Signed)
 Report provided to [facility] LPN at [facility]. EMS called by social. Spouse in the room and aware of discharge.

## 2024-02-04 NOTE — Discharge Summary (Signed)
 Physician Discharge Summary   Patient: Victor Shields MRN: [id number] DOB: 1950/12/14  Admit date:     01/27/2024  Discharge date: 02/04/24  Discharge Physician: Alm Lyall Faciane   PCP: Lavell Bari LABOR, FNP   Recommendations at discharge:   Please follow up with primary care provider within 1-2 weeks  Please repeat BMP and CBC in one week   Hospital Course: 73 year old male with hypertension, hyperlipidemia, COPD, ongoing tobacco use, presented to the emergency department with acute onset shortness of breath and peripheral edema.  EMS found him to be hypoxic at home with oxygen saturation in the mid 80s.  He was wheezing and coughing.  His baseline supplemental oxygen was increased to 3 L with some improvement in oxygen saturation to the mid 90s.  He was given 125 mg of Solu-Medrol  IV by EMS.  He says that he was recently started on Lasix  by his PCP and after starting it was having symptoms of dizziness.  Over the past 2 to 3 days he has had progressive shortness of breath wheezing coughing and weakness.  Patient reports shortness of breath with minimal exertion which is not his baseline.  He has some shortness of breath symptoms but no fever chills or chest pain.  His chest x-ray showed findings of a large right pleural effusion with compressive atelectasis of the right lung and possible pneumonia.  He had an elevated BNP.  White blood cell count 11.9.  Respiratory panel negative for influenza RSV and SARS coronavirus 2.  Hospital admission was requested for management. Patient was started on IV Solu-Medrol  and bronchodilators with gradual improvement.  He was weaned off oxygen.  He underwent thoracocentesis removing 1.5 L.  He was given a dose of IV furosemide  for his fluid overload.  Unfortunately, the patient developed atrial fibrillation with RVR on the evening of 01/29/2024.  He was placed on diltiazem  drip.  His rates remained elevated in the 140s.  Cardiology was consulted to assist with  management.  Assessment and Plan: Acute respiratory failure with hypoxia -- Secondary to large right pleural effusion -- Secondary to acute COPD exacerbation and CHF -- thoracentesis completed 7/21 and 1.5 L serous fluid removed, fluid sent for testing  -- added incentive spirometry 7/22 - initially on 2L>>RA - repeat CXR am 7/28--unchanged right pleural eff since thora - remains stable on RA   Acute COPD exacerbation -- continue IV solumedrol>>prednisone  taper -change albuterol  to xopenex  -continue pulmicort  during hospitalization -continue brovana  during hospitalizaiton -added yupelri  during hospitalization -- added flutter valve   Acute HFpEF -7/21 Echo EF 60-65%, no wMA, normal RVF, mod elevated PASP -s/p thora 7/21>>1.5 L removed>>transudative -remains clinically fluid overloaded -lasix  IV 40 x 1 on 7/23, 7/24, 7/25 -lasix  60 IV on 7/26 and 7/27 -7/28--transition to po lasix  -daily weights -I/Os incomplete -d/c with po lasix  60 mg po bid   Atrial fibrillation with RVR -started on diltiazem  drip 7/23>>po diltiazem  7/25 -started amio drip on 7/24>>po amio on 7/28 -cardiology consult appreciated -01/02/24 TSH 1.330 -CHADSVASc = 3--start apixaban  -given digoxin  x 3 on 7/24 - keep Mag >2 and K >4 - converted to sinus 7/27 at 0226   Tobacco -- Nicotine  patch ordered for cravings --cessation discussed   Mixed Hyperlipidemia -continue statin         Consultants: cardiology Procedures performed: none  Disposition: Home Diet recommendation:  Cardiac diet DISCHARGE MEDICATION: Allergies as of 02/04/2024       Reactions   Penicillins Swelling, Other (See Comments)  Medication List     STOP taking these medications    naproxen sodium 220 MG tablet Commonly known as: ALEVE       TAKE these medications    acetaminophen  500 MG tablet Commonly known as: TYLENOL  Take 500 mg by mouth every 6 (six) hours as needed for moderate pain (pain score  4-6).   albuterol  108 (90 Base) MCG/ACT inhaler Commonly known as: VENTOLIN  HFA Inhale 2 puffs into the lungs every 6 (six) hours as needed for wheezing or shortness of breath. What changed: additional instructions   amiodarone  200 MG tablet Commonly known as: PACERONE  Take 1 tablet (200 mg total) by mouth 2 (two) times daily.   apixaban  5 MG Tabs tablet Commonly known as: ELIQUIS  Take 1 tablet (5 mg total) by mouth 2 (two) times daily.   Breztri  Aerosphere 160-9-4.8 MCG/ACT Aero inhaler Generic drug: budesonide -glycopyrrolate -formoterol  Inhale 2 puffs into the lungs 2 (two) times daily.   cetirizine  10 MG tablet Commonly known as: ZYRTEC  Take 1 tablet (10 mg total) by mouth daily.   diltiazem  240 MG 24 hr capsule Commonly known as: CARDIZEM  CD Take 1 capsule (240 mg total) by mouth daily. Start taking on: February 05, 2024   fluticasone  50 MCG/ACT nasal spray Commonly known as: FLONASE  Place 2 sprays into both nostrils daily.   furosemide  20 MG tablet Commonly known as: LASIX  Take 3 tablets (60 mg total) by mouth 2 (two) times daily. What changed:  how much to take when to take this   levalbuterol  0.63 MG/3ML nebulizer solution Commonly known as: XOPENEX  Take 3 mLs (0.63 mg total) by nebulization 2 (two) times daily.   meclizine  25 MG tablet Commonly known as: ANTIVERT  Take 1 tablet (25 mg total) by mouth 3 (three) times daily as needed for dizziness.   ondansetron  4 MG tablet Commonly known as: Zofran  Take 1 tablet (4 mg total) by mouth every 8 (eight) hours as needed for nausea or vomiting.   predniSONE  10 MG tablet Commonly known as: DELTASONE  Take 6 tablets (60 mg total) by mouth daily with breakfast. And decrease by one tablet daily Start taking on: February 05, 2024   rosuvastatin  5 MG tablet Commonly known as: Crestor  Take 1 tablet (5 mg total) by mouth daily.   sildenafil  100 MG tablet Commonly known as: Viagra  Take 0.5-1 tablets (50-100 mg total) by  mouth daily as needed for erectile dysfunction.   trolamine salicylate 10 % cream Commonly known as: ASPERCREME Apply 1 application topically as needed for muscle pain.        Contact information for follow-up providers     [facility] Follow up.   Specialty: Home Health Services Why: [facility] will follow up with you at discharge to provide home health physical therapy Contact information: [address] [place] [address] [phone]         Johnson Laymon HERO, PA-C Follow up on 03/06/2024.   Specialties: Cardiology, Cardiology Why: Follow up with Laymon Johnson, PA-C on 03/06/2024 at 3:30 pm Contact information: [address] [place] [address] [phone]              Contact information for after-discharge care     Destination     [facility] .   Service: Skilled Nursing Contact information: [address] [facility] [place]  [address] [phone]                    Discharge Exam: Fredricka Weights  02/02/24 1201 02/03/24 0618 02/03/24 0654  Weight: 64.5 kg 69.3 kg 64.3 kg   HEENT:  [place]/AT, No thrush, no icterus CV:  RRR, no rub, no S3, no S4 Lung:  bibasilar crackles. No wheeze Abd:  soft/+BS, NT Ext:  trace LE edema, no lymphangitis, no synovitis, no rash   Condition at discharge: stable  The results of significant diagnostics from this hospitalization (including imaging, microbiology, ancillary and laboratory) are listed below for reference.   Imaging Studies: DG CHEST PORT 1 VIEW Result Date: 02/03/2024 CLINICAL DATA:  [id number].  Right pleural effusion. EXAM: PORTABLE CHEST 1 VIEW COMPARISON:  Portable chest 01/28/2024 FINDINGS: 5:10 a.m. Again noted are a small left and moderate right pleural effusions, on the right with overlying atelectasis or consolidation nearly to the mid hilum. Rest of the lungs are generally clear. The cardiac size is normal. Mediastinum is normally outlined. There is  aortic atherosclerosis. Osteopenia and thoracic spondylosis. Overall aeration seems unchanged. IMPRESSION: Small left and moderate right pleural effusions, on the right with overlying atelectasis or consolidation nearly to the mid hilum. No significant change. Stable overall aeration. Electronically Signed   By: Francis Quam M.D.   On: 02/03/2024 06:52   CT CHEST WO CONTRAST Result Date: 01/30/2024 CLINICAL DATA:  Rhonchi and wheezing.  Decreased breath sounds. EXAM: CT CHEST WITHOUT CONTRAST TECHNIQUE: Multidetector CT imaging of the chest was performed following the standard protocol without IV contrast. RADIATION DOSE REDUCTION: This exam was performed according to the departmental dose-optimization program which includes automated exposure control, adjustment of the mA and/or kV according to patient size and/or use of iterative reconstruction technique. COMPARISON:  January 28, 2024. FINDINGS: Cardiovascular: Atherosclerosis of thoracic aorta is noted without aneurysm formation. Normal cardiac size. No pericardial effusion. Coronary artery calcifications are noted. Mediastinum/Nodes: No enlarged mediastinal or axillary lymph nodes. Thyroid  gland, trachea, and esophagus demonstrate no significant findings. Lungs/Pleura: Mild to moderate right pleural effusion is noted. Small left pleural effusion is noted. No pneumothorax is noted. Emphysematous disease is noted. Right lower lobe and middle lobe opacities are noted concerning for pneumonia or atelectasis. Upper Abdomen: Possible nodular hepatic contours are noted suggesting cirrhosis. Musculoskeletal: No chest wall mass or suspicious bone lesions identified. IMPRESSION: Bilateral pleural effusions as noted above, right greater than left. Right lower lobe and middle lobe opacities are noted concerning for pneumonia or atelectasis. Coronary artery calcifications are noted. 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 Aortic Atherosclerosis (ICD10-I70.0) and Emphysema  (ICD10-J43.9). Electronically Signed   By: Lynwood Landy Raddle M.D.   On: 01/30/2024 16:08   Portable chest 1 View Result Date: 01/28/2024 CLINICAL DATA:  Pleural effusion. EXAM: PORTABLE CHEST 1 VIEW COMPARISON:  01/27/2024 FINDINGS: Right base collapse/consolidation with effusion is similar to prior. Left lung is clear. Interstitial markings are diffusely coarsened with chronic features. Cardiopericardial silhouette is at upper limits of normal for size. No acute bony abnormality. IMPRESSION: Right base collapse/consolidation with effusion, similar to prior. Electronically Signed   By: Camellia Candle M.D.   On: 01/28/2024 06:37   ECHOCARDIOGRAM COMPLETE Result Date: 01/27/2024    ECHOCARDIOGRAM REPORT   Patient Name:   [facility] Kenlynn Houde Mccormack Date of Exam: 01/27/2024 Medical Rec #:  [id number]           Height:       68.0 in Accession #:    [id number]          Weight:       135.4 lb Date of Birth:  05-03-1951  BSA:          1.731 m Patient Age:    73 years            BP:           159/72 mmHg Patient Gender: M                   HR:           110 bpm. Exam Location:  [facility] Procedure: 2D Echo, Cardiac Doppler and Color Doppler (Both Spectral and Color            Flow Doppler were utilized during procedure). Indications:    dyspnea  History:        Patient has no prior history of Echocardiogram examinations.                 COPD; Risk Factors:Current Smoker.  Sonographer:    Benard Stallion Referring Phys: [phone] CLANFORD L JOHNSON IMPRESSIONS  1. Left ventricular ejection fraction, by estimation, is 60 to 65%. The left ventricle has normal function. The left ventricle has no regional wall motion abnormalities. Left ventricular diastolic function could not be evaluated.  2. Right ventricular systolic function is normal. The right ventricular size is normal. There is moderately elevated pulmonary artery systolic pressure.  3. The mitral valve is normal in structure. Trivial mitral valve regurgitation. No  evidence of mitral stenosis.  4. The aortic valve is normal in structure. Aortic valve regurgitation is not visualized. No aortic stenosis is present.  5. The inferior vena cava is normal in size with greater than 50% respiratory variability, suggesting right atrial pressure of 3 mmHg. FINDINGS  Left Ventricle: Left ventricular ejection fraction, by estimation, is 60 to 65%. The left ventricle has normal function. The left ventricle has no regional wall motion abnormalities. The left ventricular internal cavity size was normal in size. There is  no left ventricular hypertrophy. Left ventricular diastolic function could not be evaluated. Right Ventricle: The right ventricular size is normal. No increase in right ventricular wall thickness. Right ventricular systolic function is normal. There is moderately elevated pulmonary artery systolic pressure. The tricuspid regurgitant velocity is 3.30 m/s, and with an assumed right atrial pressure of 3 mmHg, the estimated right ventricular systolic pressure is 46.6 mmHg. Left Atrium: Left atrial size was normal in size. Right Atrium: Right atrial size was normal in size. Pericardium: There is no evidence of pericardial effusion. Mitral Valve: The mitral valve is normal in structure. Trivial mitral valve regurgitation. No evidence of mitral valve stenosis. Tricuspid Valve: The tricuspid valve is normal in structure. Tricuspid valve regurgitation is mild . No evidence of tricuspid stenosis. Aortic Valve: The aortic valve is normal in structure. Aortic valve regurgitation is not visualized. No aortic stenosis is present. Aortic valve mean gradient measures 3.0 mmHg. Aortic valve peak gradient measures 6.5 mmHg. Aortic valve area, by VTI measures 3.31 cm. Pulmonic Valve: The pulmonic valve was normal in structure. Pulmonic valve regurgitation is not visualized. No evidence of pulmonic stenosis. Aorta: The aortic root is normal in size and structure. Venous: The inferior vena cava  is normal in size with greater than 50% respiratory variability, suggesting right atrial pressure of 3 mmHg. IAS/Shunts: No atrial level shunt detected by color flow Doppler.  LEFT VENTRICLE PLAX 2D LVIDd:         4.95 cm   Diastology LVIDs:         3.70 cm   LV e' medial:  7.51 cm/s LV PW:         0.85 cm   LV e' lateral: 9.25 cm/s LV IVS:        0.85 cm LVOT diam:     2.20 cm LV SV:         65 LV SV Index:   37 LVOT Area:     3.80 cm  RIGHT VENTRICLE RV Basal diam:  3.25 cm RV Mid diam:    3.15 cm RV S prime:     18.60 cm/s TAPSE (M-mode): 2.4 cm LEFT ATRIUM           Index        RIGHT ATRIUM           Index LA Vol (A4C): 67.0 ml 38.70 ml/m  RA Area:     12.30 cm                                    RA Volume:   32.30 ml  18.66 ml/m  AORTIC VALVE AV Area (Vmax):    3.35 cm AV Area (Vmean):   3.30 cm AV Area (VTI):     3.31 cm AV Vmax:           127.00 cm/s AV Vmean:          83.400 cm/s AV VTI:            0.195 m AV Peak Grad:      6.5 mmHg AV Mean Grad:      3.0 mmHg LVOT Vmax:         112.00 cm/s LVOT Vmean:        72.300 cm/s LVOT VTI:          0.170 m LVOT/AV VTI ratio: 0.87  AORTA Ao Root diam: 3.20 cm TRICUSPID VALVE TR Peak grad:   43.6 mmHg TR Vmax:        330.00 cm/s  SHUNTS Systemic VTI:  0.17 m Systemic Diam: 2.20 cm Wilbert Bihari MD Electronically signed by Wilbert Bihari MD Signature Date/Time: 01/27/2024/6:41:20 PM    Final    US  THORACENTESIS ASP PLEURAL SPACE W/IMG GUIDE Result Date: 01/27/2024 INDICATION: Patient with history of COPD admitted with dyspnea and found to have large right pleural effusion. Request for diagnostic and therapeutic right thoracentesis. EXAM: ULTRASOUND GUIDED RIGHT THORACENTESIS MEDICATIONS: 6 mL 1% lidocaine  COMPLICATIONS: None immediate. PROCEDURE: An ultrasound guided thoracentesis was thoroughly discussed with the patient and questions answered. The benefits, risks, alternatives and complications were also discussed. The patient understands and wishes to proceed  with the procedure. Written consent was obtained. Ultrasound was performed to localize and mark an adequate pocket of fluid in the right chest. The area was then prepped and draped in the normal sterile fashion. 1% Lidocaine  was used for local anesthesia. Under ultrasound guidance a 6 Fr Safe-T-Centesis catheter was introduced. Thoracentesis was performed. The catheter was removed and a dressing applied. FINDINGS: A total of approximately 1.5 L of clear yellow fluid was removed. Samples were sent to the laboratory as requested by the clinical team. IMPRESSION: Successful ultrasound guided right thoracentesis yielding 1.5 L of pleural fluid. Performed by Clotilda Hesselbach, PA-C Electronically Signed   By: Ester Sides M.D.   On: 01/27/2024 12:55   DG Chest 1 View Result Date: 01/27/2024 CLINICAL DATA:  Post thoracentesis. EXAM: CHEST  1 VIEW COMPARISON:  Radiographs 01/27/2024 and 02/15/2020. FINDINGS: 1129 hours.  Interval decreased right pleural effusion with improved aeration of the right lung base. No definite pneumothorax. The heart size and mediastinal contours are stable with mild mediastinal shift to the right. The left lung appears clear. The bones appear unremarkable. IMPRESSION: Interval decreased right pleural effusion with improved aeration of the right lung base but incomplete re-expansion of the right lung. No definite pneumothorax. Electronically Signed   By: Elsie Perone M.D.   On: 01/27/2024 11:59   DG Chest Port 1 View Result Date: 01/27/2024 CLINICAL DATA:  Shortness of breath, wheezing, low O2 sats. EXAM: PORTABLE CHEST 1 VIEW COMPARISON:  02/15/2020. FINDINGS: Trachea is midline. Heart size stable. Large right pleural effusion with right basilar collapse/consolidation. Streaky atelectasis in the left lung base. IMPRESSION: Large right pleural effusion with collapse/consolidation in the right lung base, possibly due to pneumonia. Difficult exclude underlying malignancy. Followup PA and  lateral chest X-ray is recommended in 3-4 weeks following trial of antibiotic therapy to ensure resolution and exclude underlying malignancy. Electronically Signed   By: Newell Eke M.D.   On: 01/27/2024 09:25    Microbiology: Results for orders placed or performed during the hospital encounter of 01/27/24  Resp panel by RT-PCR (RSV, Flu A&B, Covid) Anterior Nasal Swab     Status: None   Collection Time: 01/27/24  9:15 AM   Specimen: Anterior Nasal Swab  Result Value Ref Range Status   SARS Coronavirus 2 by RT PCR NEGATIVE NEGATIVE Final    Comment: (NOTE) SARS-CoV-2 target nucleic acids are NOT DETECTED.  The SARS-CoV-2 RNA is generally detectable in upper respiratory specimens during the acute phase of infection. The lowest concentration of SARS-CoV-2 viral copies this assay can detect is 138 copies/mL. A negative result does not preclude SARS-Cov-2 infection and should not be used as the sole basis for treatment or other patient management decisions. A negative result may occur with  improper specimen collection/handling, submission of specimen other than nasopharyngeal swab, presence of viral mutation(s) within the areas targeted by this assay, and inadequate number of viral copies(<138 copies/mL). A negative result must be combined with clinical observations, patient history, and epidemiological information. The expected result is Negative.  Fact Sheet for Patients:  BloggerCourse.com  Fact Sheet for Healthcare Providers:  SeriousBroker.it  This test is no t yet approved or cleared by the [place]  FDA and  has been authorized for detection and/or diagnosis of SARS-CoV-2 by FDA under an Emergency Use Authorization (EUA). This EUA will remain  in effect (meaning this test can be used) for the duration of the COVID-19 declaration under Section 564(b)(1) of the Act, 21 U.S.C.section 360bbb-3(b)(1), unless the  authorization is terminated  or revoked sooner.       Influenza A by PCR NEGATIVE NEGATIVE Final   Influenza B by PCR NEGATIVE NEGATIVE Final    Comment: (NOTE) The Xpert Xpress SARS-CoV-2/FLU/RSV plus assay is intended as an aid in the diagnosis of influenza from Nasopharyngeal swab specimens and should not be used as a sole basis for treatment. Nasal washings and aspirates are unacceptable for Xpert Xpress SARS-CoV-2/FLU/RSV testing.  Fact Sheet for Patients: BloggerCourse.com  Fact Sheet for Healthcare Providers: SeriousBroker.it  This test is not yet approved or cleared by the [place]  FDA and has been authorized for detection and/or diagnosis of SARS-CoV-2 by FDA under an Emergency Use Authorization (EUA). This EUA will remain in effect (meaning this test can be used) for the duration of the COVID-19 declaration under Section 564(b)(1) of the Act, 21  U.S.C. section 360bbb-3(b)(1), unless the authorization is terminated or revoked.     Resp Syncytial Virus by PCR NEGATIVE NEGATIVE Final    Comment: (NOTE) Fact Sheet for Patients: BloggerCourse.com  Fact Sheet for Healthcare Providers: SeriousBroker.it  This test is not yet approved or cleared by the [place]  FDA and has been authorized for detection and/or diagnosis of SARS-CoV-2 by FDA under an Emergency Use Authorization (EUA). This EUA will remain in effect (meaning this test can be used) for the duration of the COVID-19 declaration under Section 564(b)(1) of the Act, 21 U.S.C. section 360bbb-3(b)(1), unless the authorization is terminated or revoked.  Performed at [facility], [address], [place], [place] [address]   Culture, blood (Routine X 2) w Reflex to ID Panel     Status: None   Collection Time: 01/27/24 10:39 AM   Specimen: BLOOD  Result Value Ref Range Status   Specimen Description BLOOD  BLOOD RIGHT ARM  Final   Special Requests   Final    BOTTLES DRAWN AEROBIC AND ANAEROBIC Blood Culture adequate volume   Culture   Final    NO GROWTH 5 DAYS Performed at [facility], [address] [place], [place] [address]    Report Status 02/01/2024 FINAL  Final  Culture, blood (Routine X 2) w Reflex to ID Panel     Status: None   Collection Time: 01/27/24 10:45 AM   Specimen: BLOOD  Result Value Ref Range Status   Specimen Description BLOOD BLOOD LEFT ARM  Final   Special Requests   Final    BOTTLES DRAWN AEROBIC ONLY Blood Culture results may not be optimal due to an inadequate volume of blood received in culture bottles   Culture   Final    NO GROWTH 5 DAYS Performed at [facility], [address], [place], [place] [address]    Report Status 02/01/2024 FINAL  Final  MRSA Next Gen by PCR, Nasal     Status: None   Collection Time: 01/29/24  7:00 PM   Specimen: Nasal Mucosa; Nasal Swab  Result Value Ref Range Status   MRSA by PCR Next Gen NOT DETECTED NOT DETECTED Final    Comment: (NOTE) The GeneXpert MRSA Assay (FDA approved for NASAL specimens only), is one component of a comprehensive MRSA colonization surveillance program. It is not intended to diagnose MRSA infection nor to guide or monitor treatment for MRSA infections. Test performance is not FDA approved in patients less than 11 years old. Performed at [facility], [address], [place], [place] [address]     Labs: CBC: Recent Labs  Lab 01/29/24 0444 01/30/24 0427  WBC 19.3* 17.5*  NEUTROABS 17.3* 15.8*  HGB 12.3* 15.5  HCT 36.5* 46.7  MCV 93.6 94.5  PLT PLATELET CLUMPS NOTED ON SMEAR, UNABLE TO ESTIMATE 334   Basic Metabolic Panel: Recent Labs  Lab 01/31/24 0420 02/01/24 0500 02/02/24 0606 02/03/24 0439 02/04/24 0506  NA 136 136 135 133* 132*  K 3.9 4.0 4.2 3.6 3.5  CL 104 101 95* 94* 90*  CO2 23 26 31 31 31   GLUCOSE 158* 154* 158* 205* 196*  BUN 26* 24* 23 23 22   CREATININE 0.73 0.69 0.75  0.68 0.76  CALCIUM  8.5* 8.5* 8.3* 8.4* 8.2*  MG 1.9 1.8 2.1 2.0 2.0  PHOS  --   --  2.9 2.5 3.0   Liver Function Tests: No results for input(s): AST, ALT, ALKPHOS, BILITOT, PROT, ALBUMIN in the last 168 hours. CBG: No results  for input(s): GLUCAP in the last 168 hours.  Discharge time spent: greater than 30 minutes.  Signed: Alm Schneider, MD [facility] 02/04/2024

## 2024-02-04 NOTE — Care Management Important Message (Signed)
 Important Message  Patient Details  Name: Victor Shields MRN: [id number] Date of Birth: April 22, 1951   Important Message Given:  Yes - Medicare IM     [name] [name] 02/04/2024, 3:49 PM

## 2024-02-04 NOTE — Progress Notes (Signed)
 Progress Note  Patient Name: Victor Shields Date of Encounter: 02/04/2024  Primary Cardiologist: Haydon Kalmar P Viren Lebeau, MD  Subjective   SOB improved except when he exerts.  Overall feeling weak.  Inpatient Medications    Scheduled Meds:  amiodarone   200 mg Oral BID   apixaban   5 mg Oral BID   arformoterol   15 mcg Nebulization BID   budesonide  (PULMICORT ) nebulizer solution  0.5 mg Nebulization BID   Chlorhexidine  Gluconate Cloth  6 each Topical Q0600   clotrimazole   10 mg Oral 5 X Daily   diltiazem   240 mg Oral Daily   fluticasone   2 spray Each Nare Daily   furosemide   40 mg Oral BID   guaiFENesin   15 mL Oral QID   levalbuterol   0.63 mg Nebulization BID   loratadine   10 mg Oral Daily   methylPREDNISolone  (SOLU-MEDROL ) injection  60 mg Intravenous BID   nicotine   14 mg Transdermal Daily   phosphorus  500 mg Oral Daily   polyethylene glycol  17 g Oral Daily   revefenacin   175 mcg Nebulization Daily   rosuvastatin   5 mg Oral Daily   Continuous Infusions:   PRN Meds: acetaminophen  **OR** acetaminophen , bisacodyl , dextromethorphan , fentaNYL  (SUBLIMAZE ) injection, meclizine , ondansetron  **OR** ondansetron  (ZOFRAN ) IV, mouth rinse, oxyCODONE , traZODone    Vital Signs    Vitals:   02/03/24 1949 02/03/24 2208 02/04/24 0409 02/04/24 0816  BP:  (!) 129/55 (!) 143/59   Pulse:  73 79   Resp:  20 20   Temp:  97.6 F (36.4 C) 97.6 F (36.4 C)   TempSrc:  Oral Oral   SpO2: 98% 96% 96% 95%  Weight:      Height:        Intake/Output Summary (Last 24 hours) at 02/04/2024 1144 Last data filed at 02/04/2024 0849 Gross per 24 hour  Intake 659.76 ml  Output 1075 ml  Net -415.24 ml   Filed Weights   02/02/24 1201 02/03/24 0618 02/03/24 0654  Weight: 64.5 kg 69.3 kg 64.3 kg    Telemetry     Personally reviewed.  NSR.  ECG   Not performed today.  Physical Exam   GEN: No acute distress.   Neck: No JVD. Cardiac: RRR, no murmur, rub, or gallop.  Respiratory:  Bilateral wheezing and rhonchi, improved. GI: Soft, nontender, bowel sounds present. MS: 1+ pitting edema in the left leg; No deformity. Neuro:  Nonfocal. Psych: Alert and oriented x 3. Normal affect.  Labs    Chemistry Recent Labs  Lab 02/02/24 0606 02/03/24 0439 02/04/24 0506  NA 135 133* 132*  K 4.2 3.6 3.5  CL 95* 94* 90*  CO2 31 31 31   GLUCOSE 158* 205* 196*  BUN 23 23 22   CREATININE 0.75 0.68 0.76  CALCIUM  8.3* 8.4* 8.2*  GFRNONAA >60 >60 >60  ANIONGAP 9 8 11      Hematology Recent Labs  Lab 01/29/24 0444 01/30/24 0427  WBC 19.3* 17.5*  RBC 3.90* 4.94  HGB 12.3* 15.5  HCT 36.5* 46.7  MCV 93.6 94.5  MCH 31.5 31.4  MCHC 33.7 33.2  RDW 14.0 14.1  PLT PLATELET CLUMPS NOTED ON SMEAR, UNABLE TO ESTIMATE 334    Cardiac Enzymes Recent Labs  Lab 01/27/24 0858  TROPONINIHS 7    BNPNo results for input(s): BNP, PROBNP in the last 168 hours.   DDimerNo results for input(s): DDIMER in the last 168 hours.   Radiology    DG CHEST PORT 1 VIEW Result Date: 02/03/2024  CLINICAL DATA:  [id number].  Right pleural effusion. EXAM: PORTABLE CHEST 1 VIEW COMPARISON:  Portable chest 01/28/2024 FINDINGS: 5:10 a.m. Again noted are a small left and moderate right pleural effusions, on the right with overlying atelectasis or consolidation nearly to the mid hilum. Rest of the lungs are generally clear. The cardiac size is normal. Mediastinum is normally outlined. There is aortic atherosclerosis. Osteopenia and thoracic spondylosis. Overall aeration seems unchanged. IMPRESSION: Small left and moderate right pleural effusions, on the right with overlying atelectasis or consolidation nearly to the mid hilum. No significant change. Stable overall aeration. Electronically Signed   By: Francis Quam M.D.   On: 02/03/2024 06:52   Assessment & Plan   Atrial fibrillation/flutter with RVR - Telemetry reviewed, has been in normal sinus rhythm.  Previously had tough time controlling his  heart rates, initially on IV Cardizem  drip, amiodarone  and digoxin . - Continue diltiazem  240 mg once daily. - Continue p.o. amiodarone  200 mg twice daily.  He needs 7-month outpatient amiodarone  therapy and to discontinue. - Continue Eliquis  5 mg twice daily. - Echocardiogram this admission showed normal LVEF, normal RV function, no valvular heart disease and CVP 3 mmHg.   Acute diastolic heart failure -Presented with DOE.  BNP 376 on admission. S/p 1.5L thoracentesis on the right side.  Chest x-ray and CT chest showed reaccumulating pleural effusion on the right side.  Initially had 2+ pitting edema that improved with IV Lasix .  Initially on IV Lasix  60 mg twice daily that was switched to p.o. Lasix  40 mg twice daily yesterday.  Due to reaccumulating pleural effusion on the right side, will increase the p.o. Lasix  dose from 40 mg to 60 mg twice daily. -Reds Vest today.   Moderate right pleural effusion - s/p right-sided thoracentesis, 1.5L.  Repeat chest x-ray and CT chest showed reaccumulating pleural effusion on the right side and concern for pneumonia/atelectasis.  Management per primary.   COPD exacerbation -Management per primary.    Signed, Diannah SHAUNNA Maywood, MD  02/04/2024, 11:44 AM

## 2024-02-05 DIAGNOSIS — I480 Paroxysmal atrial fibrillation: Secondary | ICD-10-CM | POA: Diagnosis not present

## 2024-02-05 DIAGNOSIS — Z7401 Bed confinement status: Secondary | ICD-10-CM | POA: Diagnosis not present

## 2024-02-05 DIAGNOSIS — Z720 Tobacco use: Secondary | ICD-10-CM | POA: Diagnosis not present

## 2024-02-05 DIAGNOSIS — I10 Essential (primary) hypertension: Secondary | ICD-10-CM | POA: Diagnosis not present

## 2024-02-05 DIAGNOSIS — R531 Weakness: Secondary | ICD-10-CM | POA: Diagnosis not present

## 2024-02-05 DIAGNOSIS — J9601 Acute respiratory failure with hypoxia: Secondary | ICD-10-CM | POA: Diagnosis not present

## 2024-02-05 DIAGNOSIS — J441 Chronic obstructive pulmonary disease with (acute) exacerbation: Secondary | ICD-10-CM | POA: Diagnosis not present

## 2024-02-05 DIAGNOSIS — I5031 Acute diastolic (congestive) heart failure: Secondary | ICD-10-CM | POA: Diagnosis not present

## 2024-02-05 DIAGNOSIS — E785 Hyperlipidemia, unspecified: Secondary | ICD-10-CM | POA: Diagnosis not present

## 2024-02-05 DIAGNOSIS — J302 Other seasonal allergic rhinitis: Secondary | ICD-10-CM | POA: Diagnosis not present

## 2024-02-05 DIAGNOSIS — R600 Localized edema: Secondary | ICD-10-CM | POA: Diagnosis not present

## 2024-02-05 NOTE — Transitions of Care (Post Inpatient/ED Visit) (Signed)
 02/05/2024  TOC RN reviewed chart and per documentation related to ED encounter 02/04/24 - 02/05/24 patient Discharged/Transferred to SNF - [facility] outreach is not indicated  Patient ID: Victor Shields, male   DOB: [birth year], 73 y.o.   MRN: [id number]  Shona Prow RN, CCM [place]  VBCI-Population Health RN Care Manager [phone]

## 2024-02-05 NOTE — Progress Notes (Signed)
 02/05/2024 @ 0810 Received call from pt's wife. She states that pt was supposed to go to the rehab center at the hospital in [facility]) which was where he was before but pt was sent to [facility] (formerly [facility] [place]). Requested to speak with Child psychotherapist. Message sent to [facility], CM/SW.

## 2024-02-07 DIAGNOSIS — R55 Syncope and collapse: Secondary | ICD-10-CM | POA: Diagnosis not present

## 2024-02-07 DIAGNOSIS — I480 Paroxysmal atrial fibrillation: Secondary | ICD-10-CM | POA: Diagnosis not present

## 2024-02-07 DIAGNOSIS — J302 Other seasonal allergic rhinitis: Secondary | ICD-10-CM | POA: Diagnosis not present

## 2024-02-07 DIAGNOSIS — J441 Chronic obstructive pulmonary disease with (acute) exacerbation: Secondary | ICD-10-CM | POA: Diagnosis not present

## 2024-02-07 DIAGNOSIS — I5031 Acute diastolic (congestive) heart failure: Secondary | ICD-10-CM | POA: Diagnosis not present

## 2024-02-07 DIAGNOSIS — Z720 Tobacco use: Secondary | ICD-10-CM | POA: Diagnosis not present

## 2024-02-07 DIAGNOSIS — J9601 Acute respiratory failure with hypoxia: Secondary | ICD-10-CM | POA: Diagnosis not present

## 2024-02-07 DIAGNOSIS — I10 Essential (primary) hypertension: Secondary | ICD-10-CM | POA: Diagnosis not present

## 2024-02-07 DIAGNOSIS — E782 Mixed hyperlipidemia: Secondary | ICD-10-CM | POA: Diagnosis not present

## 2024-02-07 NOTE — Nursing Note (Signed)
 Resident arrived to facility via facility van. Alert and verbal. Wife at bedside. O2 in place at 3 LPM. No SOB or cough noted. BUE wrapped with kerlix. Dressings saturated. Removed dressings. Scattered bruising to BUE with multiple skin tears and weeping noted. Non pitting edema BUE. Areas cleaned and dressed. ST to L posterior shoulder. Cleaned and dressed. Red excoriation noted to groin, penis, and scrotum. Incontinence care provided and cream applied. Bruising noted to BLE. Non pitting edema to BLE and weeping noted to LLE. ST to B posterior LE. Cleaned and dressed. DTI to R and L heel. SP applied and bunny boots placed. Stage 2 to sacrum. Puracol and dressing applied. Upper dentures. No bottom teeth/dentures. Wears glasses. Has hearing aides at home and does not want to bring due to fear they may get lost. Minimal hearing difficulty without hearing aides in place. Large scab to R knee. Orange urine but patient is on pyridium for urinary burning due to UTI. NP notified of arrival. No s/s of acute distress.

## 2024-02-10 ENCOUNTER — Ambulatory Visit

## 2024-02-10 NOTE — LTC Provider Review (Signed)
 PHYSICAL THERAPY TREATMENT NOTE      Patient Name:  Victor Shields      Medical Record Number: [id number]  Date of Birth: 05-17-1951 Location: [facility] [place]        PT Precautions: Falls, mulitple skin tears with bandages applied on bilateral forearms, L shoulder and R lower leg. OT Precautions: Falls,   SLP Precautions:  ,    Weight Bearing: RUE Weight Bearing: Weight bearing as tolerated   LUE Weight Bearing: Weight bearing as tolerated   RLE Weight Bearing: Weight bearing as tolerated   LLE Weight Bearing: Weight bearing as tolerated    SUBJECTIVE Pt was in bed upon entering room. Pt agreed to participate with therapist in skilled PT services.   OBJECTIVE Today's Treatment:  Bed Mobility From 1: Supine, Rolling right, Rolling left Bed Mobility Type 1: To and from Bed Mobility to 1: Side lying-right, Side lying-left, Short sit Level of Assistance 1: Standby assistance, Moderate verbal cues, Minimal tactile cues, Minimum assistance Bed Mobility Comments 1: Pt required verbal/tactile cuing to improve active reaching across mid line, weight shifting, and core/hip control to facilitate safe roling in segements to R and L side. Pt required SR support at SBA. Pt required encouragement and verbal/tactile cuing to improve B hand, L elbow placement, and sequencing of muscle activity to increase trunk stability and safety with supine <> sit at EOB. Pt required minA for steadiness and feed back to improve active weight shifting techniques. Pt required time due to slow sequencing of movement. Pt also required frequent rest periods due to increased slight SOB and anxiety. Pt asked for rescue inhaler from nursing. Nurse notified and came with resuce inhaler.        Transfer from: Bed, Sit, Wheelchair, Stand Transfer Type 1: To and from Transfer to 1: Stand, Wheelchair Technique 1: Stand pivot Transfer Device 1: RW Transfer Level of Assistance 1:  Minimum assistance, Minimal verbal cues Trials/Comments 1: Pt required verbal cuing to improve postural corrections, continuity of steps, and alignment with in support of AD and to front/center of sitting area to increase balance and independence. Pt required AD to increase steadiness with minA. Pt required time due to slow sequencing with noted low endurance to standing.        Balance Training: Therapist provided static sitting balance training to increase activity tolerance, safety, and independence with bed mobility, functional sitting, and STS transfers. Pt sat on EOB with poor flexed trunk. Therapist provided education and tactile/verbal cuing to improve active weight shifting and postural corrections to increase body awarness and tolerance to upright unsupported sitting. SBA for safety. Frequent rest periods required due to low endurance.       I attest that I have reviewed the above information. Signed: Redell Dustman, PTA 02/10/2024

## 2024-02-12 DIAGNOSIS — N3000 Acute cystitis without hematuria: Secondary | ICD-10-CM | POA: Diagnosis not present

## 2024-02-12 DIAGNOSIS — N1832 Chronic kidney disease, stage 3b: Secondary | ICD-10-CM | POA: Diagnosis not present

## 2024-02-12 DIAGNOSIS — J189 Pneumonia, unspecified organism: Secondary | ICD-10-CM | POA: Diagnosis not present

## 2024-02-12 DIAGNOSIS — J432 Centrilobular emphysema: Secondary | ICD-10-CM | POA: Diagnosis not present

## 2024-02-12 DIAGNOSIS — K8020 Calculus of gallbladder without cholecystitis without obstruction: Secondary | ICD-10-CM | POA: Diagnosis not present

## 2024-02-12 DIAGNOSIS — R55 Syncope and collapse: Secondary | ICD-10-CM | POA: Diagnosis not present

## 2024-02-12 DIAGNOSIS — E785 Hyperlipidemia, unspecified: Secondary | ICD-10-CM | POA: Diagnosis not present

## 2024-02-12 DIAGNOSIS — I130 Hypertensive heart and chronic kidney disease with heart failure and stage 1 through stage 4 chronic kidney disease, or unspecified chronic kidney disease: Secondary | ICD-10-CM | POA: Diagnosis not present

## 2024-02-12 DIAGNOSIS — E871 Hypo-osmolality and hyponatremia: Secondary | ICD-10-CM | POA: Diagnosis not present

## 2024-02-12 DIAGNOSIS — J9811 Atelectasis: Secondary | ICD-10-CM | POA: Diagnosis not present

## 2024-02-12 DIAGNOSIS — G9389 Other specified disorders of brain: Secondary | ICD-10-CM | POA: Diagnosis not present

## 2024-02-12 DIAGNOSIS — I5031 Acute diastolic (congestive) heart failure: Secondary | ICD-10-CM | POA: Diagnosis not present

## 2024-02-12 DIAGNOSIS — I480 Paroxysmal atrial fibrillation: Secondary | ICD-10-CM | POA: Diagnosis not present

## 2024-02-12 DIAGNOSIS — J449 Chronic obstructive pulmonary disease, unspecified: Secondary | ICD-10-CM | POA: Diagnosis not present

## 2024-02-12 DIAGNOSIS — J90 Pleural effusion, not elsewhere classified: Secondary | ICD-10-CM | POA: Diagnosis not present

## 2024-02-12 DIAGNOSIS — Z79899 Other long term (current) drug therapy: Secondary | ICD-10-CM | POA: Diagnosis not present

## 2024-02-12 DIAGNOSIS — J441 Chronic obstructive pulmonary disease with (acute) exacerbation: Secondary | ICD-10-CM | POA: Diagnosis not present

## 2024-02-12 DIAGNOSIS — N3001 Acute cystitis with hematuria: Secondary | ICD-10-CM | POA: Diagnosis not present

## 2024-02-12 DIAGNOSIS — R54 Age-related physical debility: Secondary | ICD-10-CM | POA: Diagnosis not present

## 2024-02-12 DIAGNOSIS — Z66 Do not resuscitate: Secondary | ICD-10-CM | POA: Diagnosis not present

## 2024-02-12 DIAGNOSIS — R001 Bradycardia, unspecified: Secondary | ICD-10-CM | POA: Diagnosis not present

## 2024-02-12 NOTE — ED Triage Notes (Signed)
 PT RECEIVED VIA EMS FROM [facility] FOR SYNCOPAL EPISODE.

## 2024-02-12 NOTE — ED Provider Notes (Signed)
 ------------------------------------------------------------------------------- Attestation signed by Cherie Ardeen Hanger, MD at 02/12/24 1831   I have reviewed the NP/PA's documentation and agree with the NP/PA's assessment and plan of care.  I had face to face evaluation of the patient. Here is my MDM:    Victor Shields is a 73 y.o. male  who presents with a complaint of syncopal episode.  Patient was recently admitted for COPD exacerbation.  Patient also complains of shortness of breath.  On exam, he does have crackles in the lung bases.  He also has pitting edema.   PMHx:  has a past medical history of Acute respiratory failure, Atrial fibrillation, CHF (congestive heart failure), Cognitive communication deficit, COPD (chronic obstructive pulmonary disease), Hyperlipemia, Hypertension, and Muscle atrophy. PSHx:  has no past surgical history on file.  DDX:  Differential diagnosis includes CHF exacerbation versus worsening pleural effusion versus pneumonia   Patient's work-up reveals pneumonia, fluid overload.  The patient is hypoxic.  Patient does meet criteria for admission.  He has significant leukocytosis.  SEPSIS FLUID STATEMENT  Administration of 30 mL/kg of crystalloid fluids would be detrimental or harmful for this patient, Victor Shields, despite having hypotension, a lactate greater than or equal to 4 mmol/L, or documentation of severe sepsis for the following reason:   - Heart failure, fluid overload  The volume of crystalloid fluids in place of 30 mL/kg that this patient, Victor Shields, was to receive is 1000 ml.  4:20 PM Patient is doing well.  Repeat Volume Status and Tissue Perfusion Assessment:  I have re-evaluated this patient at  4:26 PM, and  I have completed a focused tissue perfusion assessment.  Vital Signs:  BP 116/35   Pulse 56   Temp 36.3 C (97.3 F)   Resp 15   Wt 68.6 kg (151 lb 3.2 oz)   SpO2 97%   BMI 22.99 kg/m    Mental  Status:  Alert, oriented, thought content appropriate Cardiopulmonary Exam:  Cardiac: Regular rate and rhythm Pulmonary: Rhonchi Capillary Refill:  < or = 2 seconds Peripheral Pulse Evaluation:  radial R:2+ (normal)/L:2+ (normal) Skin Examination:  No signs of Cyanosis    CRITICAL CARE ATTESTATION STATEMENT:  I provided critical care for a total of 45 minutes.   The patient was critically ill with pleural effusion, pneumonia, sepsis  As part of my critical care management of this patient, I discussed the plan of care with the NP/PA . I also reviewed the NP/PA's docmentation and agree with the assessment and plan of care. In addition to these, my critical care time included management of patient's condition       ED Results Results for orders placed or performed during the hospital encounter of 02/12/24  Influenza/ RSV/COVID PCR   Specimen: Nasopharyngeal Swab  Result Value Ref Range   SARS-CoV-2 PCR Negative Negative   Influenza A Negative Negative   Influenza B Negative Negative   RSV Negative Negative  Comprehensive Metabolic Panel  Result Value Ref Range   Sodium 130 (L) 135 - 145 mmol/L   Potassium 3.4 (L) 3.5 - 5.0 mmol/L   Chloride 88 (L) 98 - 107 mmol/L   CO2 37.5 (H) 21.0 - 32.0 mmol/L   Anion Gap 5 3 - 11 mmol/L   BUN 44 (H) 8 - 20 mg/dL   Creatinine 8.27 (H) 9.19 - 1.30 mg/dL   BUN/Creatinine Ratio 26    eGFR CKD-EPI (2021) Male 41 (L) >=60 mL/min/1.23m2   Glucose 127 70 - 179  mg/dL   Calcium  9.1 8.5 - 10.1 mg/dL   Albumin 2.5 (L) 3.5 - 5.0 g/dL   Total Protein 5.2 (L) 6.0 - 8.0 g/dL   Total Bilirubin 1.6 (H) 0.3 - 1.2 mg/dL   AST 27 15 - 40 U/L   ALT 40 12 - 78 U/L   Alkaline Phosphatase 115 46 - 116 U/L  Magnesium   Result Value Ref Range   Magnesium  2.4 1.6 - 2.6 mg/dL  Pro-BNP  Result Value Ref Range   PRO-BNP 1,436.0 (H) 0.0 - 125.0 pg/mL  Urinalysis with Microscopy (Clean Catch)  Result Value Ref Range   Color, UA Yellow    Clarity, UA Cloudy  (A) Clear   Specific Gravity, UA 1.011 1.010 - 1.025   pH, UA 6.0 5.0 - 8.0   Leukocyte Esterase, UA Large (A) Negative   Nitrite, UA Positive (A) Negative   Protein, UA 30 mg/dL (A) Negative   Glucose, UA Negative Negative, Trace   Ketones, UA Negative Negative   Urobilinogen, UA <2.0 mg/dL <7.9 mg/dL   Bilirubin, UA Negative Negative   Blood, UA Large (A) Negative   RBC, UA 136 (H) 0 - 3 /HPF   WBC, UA 367 (H) 0 - 3 /HPF   WBC Clumps None Seen None Seen /HPF   Squam Epithel, UA <1 0 - 10 /HPF   Yeast, UA Moderate (A) None Seen /HPF   Bacteria, UA Rare (A) None Seen /HPF   Hyphal Yeast Few (A) None Seen /HPF  Lactate Sepsis  Result Value Ref Range   Lactate 3.4 (HH) 0.5 - 1.9 mmol/L  PT-INR  Result Value Ref Range   PT 19.0 (H) 9.9 - 12.6 sec   INR 1.73 Undefined  PTT  Result Value Ref Range   APTT 29.1 24.8 - 38.4 sec  ECG 12 Lead  Result Value Ref Range   EKG Systolic BP  mmHg   EKG Diastolic BP  mmHg   EKG Ventricular Rate 57 BPM   EKG Atrial Rate 57 BPM   EKG P-R Interval 166 ms   EKG QRS Duration 116 ms   EKG Q-T Interval 374 ms   EKG QTC Calculation 364 ms   EKG Calculated P Axis 45 degrees   EKG Calculated R Axis 45 degrees   EKG Calculated T Axis 223 degrees   QTC Fredericia 367 ms  POCT Glucose  Result Value Ref Range   Glucose, POC 130 (H) 70 - 105 mg/dL  CBC w/ Differential  Result Value Ref Range   WBC 28.8 (H) 4.0 - 10.5 10*9/L   RBC 3.87 (L) 4.10 - 5.60 10*12/L   HGB 11.8 (L) 12.5 - 17.0 g/dL   HCT 66.5 (L) 63.9 - 49.9 %   MCV 86.3 80.0 - 98.0 fL   MCH 30.5 27.0 - 34.0 pg   MCHC 35.3 32.0 - 36.0 g/dL   RDW 87.3 88.4 - 85.4 %   MPV 10.6 (H) 7.4 - 10.4 fL   Platelet 211 140 - 415 10*9/L   Neutrophils % 89.0 %   Lymphocytes % 3.8 %   Monocytes % 5.1 %   Eosinophils % 0.4 %   Basophils % 0.2 %   Absolute Neutrophils 25.6 (H) 1.8 - 7.8 10*9/L   Absolute Lymphocytes 1.1 0.7 - 4.5 10*9/L   Absolute Monocytes 1.5 (H) 0.1 - 1.0 10*9/L   Absolute  Eosinophils 0.1 0.0 - 0.4 10*9/L   Absolute Basophils 0.1 0.0 - 0.2 10*9/L  Morphology Review  Result Value Ref Range   Hypersegmented Neutrophils Present (A) Not Present  Results for orders placed or performed during the hospital encounter of 02/07/24  Basic Metabolic Panel  Result Value Ref Range   Sodium 126 (L) 135 - 145 mmol/L   Potassium 4.3 3.5 - 5.0 mmol/L   Chloride 88 (L) 98 - 107 mmol/L   CO2 33.3 (H) 21.0 - 32.0 mmol/L   Anion Gap 5 3 - 11 mmol/L   BUN 44 (H) 8 - 20 mg/dL   Creatinine 8.56 (H) 9.19 - 1.30 mg/dL   BUN/Creatinine Ratio 31    eGFR CKD-EPI (2021) Male 51 (L) >=60 mL/min/1.30m2   Glucose 59 (L) 70 - 179 mg/dL   Calcium  8.3 (L) 8.5 - 10.1 mg/dL  CBC  Result Value Ref Range   WBC 24.7 (H) 4.0 - 10.5 10*9/L   RBC 3.59 (L) 4.10 - 5.60 10*12/L   HGB 11.1 (L) 12.5 - 17.0 g/dL   HCT 68.7 (L) 63.9 - 49.9 %   MCV 86.9 80.0 - 98.0 fL   MCH 30.9 27.0 - 34.0 pg   MCHC 35.6 32.0 - 36.0 g/dL   RDW 87.2 88.4 - 85.4 %   MPV 10.8 (H) 7.4 - 10.4 fL   Platelet 163 140 - 415 10*9/L  Read PPD  Result Value Ref Range   TB Skin Test Negative   Morphology Review  Result Value Ref Range   Smear Review Comments See Comment Undefined   MRI brain without contrast Result Date: 02/12/2024 Exam:  MRI Brain without IV contrast  History: Syncope  Technique: Complete brain MRI without IV contrast  Comparison: Head CT 02/12/2024  Findings: There is cerebral volume loss. There is mild ventriculomegaly. The normal callosal angle and prominence of cortical sulci at the vertex of the head indicate that this is more likely due to central volume loss than normal pressure hydrocephalus. There is no intracranial hemorrhage, hydrocephalus, or mass effect. There is no acute infarction. 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 Mild/moderate chronic microvascular ischemic changes are present in the cerebral white  matter. The cerebellar tonsils are normal in shape and location. There is no Chiari malformation or hindbrain herniation.  There is left pseudophakia. The orbits are otherwise unremarkable. The optic chiasm and pituitary gland are normal in appearance. 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 The other major skull base flow voids are intact. There is no paranasal sinus disease. There is fluid throughout the left mastoid air cells.    1.    Cerebral volume loss. 2.    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 3.    Mild/moderate chronic microvascular ischemic changes in the cerebral white matter. 4.    No acute infarction. 5.    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 6.    Fluid throughout the left mastoid air cells.  Signed (Electronic Signature): 02/12/2024 3:46 PM Signed By: Aleene JONELLE Cerise, MD  CT Chest Abdomen Pelvis Wo Contrast Result Date: 02/12/2024 EXAM:  CT of the Chest, Abdomen and Pelvis without Contrast  HISTORY:  Lower abdominal pain. Pleural effusion.  TECHNIQUE: Routine CT of the chest, abdomen and pelvis without IV contrast.  Oral Contrast:  none.   AEC (automated exposure control) and/or manual techniques such as size-specific kV and mAs are employed where appropriate  to reduce radiation exposure for all CT exams.  COMPARISON:  None  CHEST FINDINGS:  CARDIAC/MEDIASTINUM:  Aortic and great vessel atherosclerosis. No aortic aneurysm. Coronary atherosclerosis in a three-vessel distribution. Trace pericardial effusion. Small nonenlarged mediastinal lymph nodes are likely reactive.  LUNGS/PLEURA:   Diffuse centrilobular and paraseptal emphysema. Regions of consolidation within the right lower lobe and right middle lobe are present and inflammatory/infectious etiology is favored. Bronchial wall thickening is most pronounced within the right lower lobe airways.  Moderate to large right pleural effusion and small left pleural effusion are present. No pneumothorax.  BONES/SOFT TISSUES:  Mild osseous demineralization. Thoracic spondylosis. No acute osseous abnormalities. Remote fracture of the surgical neck of the right humerus. Remote right fourth rib fracture anteriorly.  ABDOMEN AND PELVIS FINDINGS:  HEPATOBILIARY:  Gallstones present within the neck of the gallbladder. No definite CT evidence of cholecystitis. No focal hepatic parenchymal abnormalities.  PANCREAS:  Normal.  SPLEEN:  Normal.  ADRENALS:  Normal.  KIDNEYS/URETERS:  Intrarenal vascular calcifications. No hydroureteronephrosis or worrisome renal mass lesion allowing for unenhanced technique.  VASCULAR:  Aortoiliac atherosclerosis. Mesenteric atherosclerosis. No abdominal aortic aneurysm.  LYMPH NODES:  No pathologic adenopathy in the mesentery or retroperitoneum.  BOWEL/MESENTERY:  The stomach and duodenum are unremarkable.  No small bowel obstruction or bowel wall thickening. Cecal appendix appears relatively preserved without periappendiceal fat stranding. Moderate colonic stool burden is seen diffusely.  No pneumoperitoneum or ascites.  PELVIC ORGANS:  Moderate distention of the urinary bladder. Prostatic calcifications are present.  BONES/SOFT TISSUES:  Mild underlying osseous demineralization. Left femoral sliding lag screw and compression sideplate fixation hardware with an additional fixation screw in the femoral neck is partially imaged. Bilateral hip osteoarthritis is noted. Lumbar spondylosis and facet joint arthrosis is present.    1.    Right lower lobe and right middle lobe consolidation with bronchial wall thickening most suggestive of pneumonia. 2.    Moderate to large right pleural effusion and small left pleural effusion. 3.    Diffuse centrilobular and paraseptal emphysema. 4.    Cholelithiasis without CT evidence of cholecystitis. 5.    Moderate colonic stool burden. 6.    Atherosclerosis.  7.    Moderate distention of the urinary bladder.  Signed (Electronic Signature): 02/12/2024 2:35 PM Signed By: Jeralyn Moss, MD  CT Head Wo Contrast Result Date: 02/12/2024 Exam:  CT Head without Contrast  History:  Episode of unresponsiveness  Technique: Routine brain CT without IV contrast. AEC (automated exposure control) and/or manual techniques such as size-specific kV and mAs are employed where appropriate to reduce radiation exposure for all CT exams.  Comparison:  None.  Findings:   BRAIN:  No CT evidence of hemorrhage, edema, mass or mass effect. There are scattered white matter hypodensities involving the parietal lobes bilaterally. Though potentially chronic microangiopathic ischemic changes, lack of prior imaging to assess chronicity makes definitive assessment difficult. Consider MRI for further evaluation. Ventricles and basilar cisterns are symmetric, normal dimensions.  SOFT TISSUES:  Negative. CALVARIUM:  Negative. No fracture. SINUSES AND MASTOIDS:  No mucosal thickening or fluid.    1.    Scattered white matter hypodensities potentially indicate chronic microangiopathic ischemic changes of the brain but are nonspecific in appearance as described above suggesting MRI should be considered 2.    No other acute intracranial abnormality is identified  Signed Proofreader): 02/12/2024 2:22 PM Signed By: Dow JAYSON Agee, MD  ECG 12 Lead Result Date: 02/12/2024 Sinus bradycardia Septal infarct , age undetermined  ST & T wave abnormality, consider inferior ischemia ST & T wave abnormality, consider anterolateral ischemia Abnormal ECG No previous ECGs available Confirmed by Cherie Searle ([id number]) on 02/12/2024 1:21:15 PM  XR Chest Portable Result Date: 02/12/2024 Exam:  Portable Chest  History:  Syncope, collapse  Technique:  Single frontal view.  Comparison:  02/04/2024.  Findings:   Unchanged moderate right pleural effusion with adjacent compressive atelectasis. No pneumothorax. Partially  obscured cardiomediastinal silhouette.    1. Moderate right pleural effusion with adjacent compressive atelectasis. Superimposed airspace consolidation not excluded.  Signed (Electronic Signature): 02/12/2024 12:33 PM Signed By: Roselee Dienes, MD   Encounter Date: 02/12/24  ECG 12 Lead  Result Value   EKG Systolic BP    EKG Diastolic BP    EKG Ventricular Rate 57   EKG Atrial Rate 57   EKG P-R Interval 166   EKG QRS Duration 116   EKG Q-T Interval 374   EKG QTC Calculation 364   EKG Calculated P Axis 45   EKG Calculated R Axis 45   EKG Calculated T Axis 223   QTC Fredericia 367   Narrative   Sinus bradycardia Septal infarct , age undetermined ST & T wave abnormality, consider inferior ischemia ST & T wave abnormality, consider anterolateral ischemia Abnormal ECG No previous ECGs available Confirmed by Cherie Searle ([id number]) on 02/12/2024 1:21:15 PM    Clinical Impression:   Final diagnoses:  Pneumonia of right middle lobe due to infectious organism (Primary)  Acute cystitis with hematuria    Condition: Serious Disposition: Admit  Signed by Searle SHAUNNA Cherie, MD February 12, 2024 at 6:25 PM    -------------------------------------------------------------------------------                                                                                     Emergency Department Provider Note    ED Clinical Impression   Final diagnoses:  Pneumonia of right middle lobe due to infectious organism (Primary)  Acute cystitis with hematuria    ED Assessment/Plan    Condition: Stable Disposition: Admit  This chart has been completed using Dragon Medical Dictation software, and while attempts have been made to ensure accuracy, certain words and phrases may not be transcribed as intended.   History   Chief Complaint  Patient presents with  . Syncope   HPI  Victor Shields is a 73 y.o. male  who presents today to the  emergency department via EMS from SNF for  evaluation of syncopal episode pta.   Pt is reportedly at SNF for rehab;   recently admitted to [facility] for COPD exacerbation, hypoxia, large right pleural effusion, CHF, HLD, HTN and newly developed afib.   He had thoracentesis, started on O2 recently and also cardizem  and eliquis .   SNF and EMS reports stable vitals;  pt states he just doesn't feel good.   Appears chronically ill.   Multiple contusions and skin tears from recent falls.       Allergies: is allergic to penicillins. Medications: is not on any long-term medications. PMHx:  has a past medical history of Acute respiratory failure, Atrial fibrillation, CHF (congestive heart failure), Cognitive communication deficit, COPD (  chronic obstructive pulmonary disease), Hyperlipemia, Hypertension, and Muscle atrophy. PSHx:  has no past surgical history on file. SocHx:   Allergies, Medications, Medical, Surgical, and Social History were reviewed as documented above.   Social Drivers of Health with Concerns   Tobacco Use: High Risk (01/27/2024)   Received from [facility]   Patient History   . Smoking Tobacco Use: Every Day   . Smokeless Tobacco Use: Never   . Passive Exposure: Not on file  Alcohol Use: Not on file  Housing: Not on file  Physical Activity: Insufficiently Active (12/03/2023)   Received from [facility]   Exercise Vital Sign   . On average, how many days per week do you engage in moderate to strenuous exercise (like a brisk walk)?: 3 days   . On average, how many minutes do you engage in exercise at this level?: 20 min  Interpersonal Safety: Not on file  Substance Use: Not on file (02/04/2024)  Social Connections: Moderately Isolated (01/27/2024)   Received from [facility]   Social Connection and Isolation Panel   . In a typical week, how many times do you talk on the phone with family, friends, or neighbors?: Three times a week   . How often do you get together with friends or relatives?: Twice a week   . How often do you  attend church or religious services?: Never   . Do you belong to any clubs or organizations such as church groups, unions, fraternal or athletic groups, or school groups?: No   . How often do you attend meetings of the clubs or organizations you belong to?: Never   . Are you married, widowed, divorced, separated, never married, or living with a partner?: Married  Insurance account manager: Not on file     Review Of Systems  Review of Systems  Unable to perform ROS: Age    Physical Exam   BP 127/42   Pulse 57   Temp 36.1 C (96.9 F)   Resp 15   Wt 68.6 kg (151 lb 3.2 oz)   SpO2 96%   BMI 22.99 kg/m   Physical Exam Constitutional:      General: He is not in acute distress.    Appearance: Normal appearance. He is normal weight. He is ill-appearing (chronically).  HENT:     Head: Normocephalic.  Eyes:     Conjunctiva/sclera: Conjunctivae normal.  Cardiovascular:     Rate and Rhythm: Regular rhythm. Bradycardia present.     Pulses: Normal pulses.     Heart sounds: Normal heart sounds.  Pulmonary:     Effort: Pulmonary effort is normal. No respiratory distress.     Breath sounds: No wheezing.     Comments: Diminished bilaterally Abdominal:     General: Abdomen is flat. Bowel sounds are normal. There is no distension.     Palpations: Abdomen is soft.     Tenderness: There is no abdominal tenderness.  Musculoskeletal:     Cervical back: Normal range of motion and neck supple.     Right lower leg: Edema present.     Left lower leg: Edema present.     Comments: Multiple skin tears and bruises to arms/legs  Skin:    General: Skin is warm.  Neurological:     General: No focal deficit present.     Mental Status: He is alert.  Psychiatric:        Mood and Affect: Mood normal.     ED Course  Medical Decision  Making Pt presenting from SNF for syncope;   labs notable for significant white count at 28.8, slight AKI from baseline, hyponatremia, elevated lactic acid, UTI;   his  CT scan shows pna bilaterally with effusion;  pt currently on his typical 2 liters and maintaining 93-96%;   will proceed with sepsis protocol along with antibiotics;  will hold on 30cc/kg fluid resuscitation as pt has had fluid overload in the past.  Will consult with hospitalist regarding admission.     Procedures   Encounter Date: 02/12/24  ECG 12 Lead  Result Value   EKG Systolic BP    EKG Diastolic BP    EKG Ventricular Rate 57   EKG Atrial Rate 57   EKG P-R Interval 166   EKG QRS Duration 116   EKG Q-T Interval 374   EKG QTC Calculation 364   EKG Calculated P Axis 45   EKG Calculated R Axis 45   EKG Calculated T Axis 223   QTC Fredericia 367   Narrative   Sinus bradycardia Septal infarct , age undetermined ST & T wave abnormality, consider inferior ischemia ST & T wave abnormality, consider anterolateral ischemia Abnormal ECG No previous ECGs available Confirmed by Cherie Searle ([id number]) on 02/12/2024 1:21:15 PM     ED Results Results for orders placed or performed during the hospital encounter of 02/12/24  Influenza/ RSV/COVID PCR   Specimen: Nasopharyngeal Swab  Result Value Ref Range   SARS-CoV-2 PCR Negative Negative   Influenza A Negative Negative   Influenza B Negative Negative   RSV Negative Negative  Comprehensive Metabolic Panel  Result Value Ref Range   Sodium 130 (L) 135 - 145 mmol/L   Potassium 3.4 (L) 3.5 - 5.0 mmol/L   Chloride 88 (L) 98 - 107 mmol/L   CO2 37.5 (H) 21.0 - 32.0 mmol/L   Anion Gap 5 3 - 11 mmol/L   BUN 44 (H) 8 - 20 mg/dL   Creatinine 8.27 (H) 9.19 - 1.30 mg/dL   BUN/Creatinine Ratio 26    eGFR CKD-EPI (2021) Male 41 (L) >=60 mL/min/1.77m2   Glucose 127 70 - 179 mg/dL   Calcium  9.1 8.5 - 10.1 mg/dL   Albumin 2.5 (L) 3.5 - 5.0 g/dL   Total Protein 5.2 (L) 6.0 - 8.0 g/dL   Total Bilirubin 1.6 (H) 0.3 - 1.2 mg/dL   AST 27 15 - 40 U/L   ALT 40 12 - 78 U/L   Alkaline Phosphatase 115 46 - 116 U/L  Magnesium   Result Value Ref  Range   Magnesium  2.4 1.6 - 2.6 mg/dL  Pro-BNP  Result Value Ref Range   PRO-BNP 1,436.0 (H) 0.0 - 125.0 pg/mL  Urinalysis with Microscopy (Clean Catch)  Result Value Ref Range   Color, UA Yellow    Clarity, UA Cloudy (A) Clear   Specific Gravity, UA 1.011 1.010 - 1.025   pH, UA 6.0 5.0 - 8.0   Leukocyte Esterase, UA Large (A) Negative   Nitrite, UA Positive (A) Negative   Protein, UA 30 mg/dL (A) Negative   Glucose, UA Negative Negative, Trace   Ketones, UA Negative Negative   Urobilinogen, UA <2.0 mg/dL <7.9 mg/dL   Bilirubin, UA Negative Negative   Blood, UA Large (A) Negative   RBC, UA 136 (H) 0 - 3 /HPF   WBC, UA 367 (H) 0 - 3 /HPF   WBC Clumps None Seen None Seen /HPF   Squam Epithel, UA <1 0 - 10 /HPF   Yeast,  UA Moderate (A) None Seen /HPF   Bacteria, UA Rare (A) None Seen /HPF   Hyphal Yeast Few (A) None Seen /HPF  Lactate Sepsis  Result Value Ref Range   Lactate 3.4 (HH) 0.5 - 1.9 mmol/L  PT-INR  Result Value Ref Range   PT 19.0 (H) 9.9 - 12.6 sec   INR 1.73 Undefined  PTT  Result Value Ref Range   APTT 29.1 24.8 - 38.4 sec  ECG 12 Lead  Result Value Ref Range   EKG Systolic BP  mmHg   EKG Diastolic BP  mmHg   EKG Ventricular Rate 57 BPM   EKG Atrial Rate 57 BPM   EKG P-R Interval 166 ms   EKG QRS Duration 116 ms   EKG Q-T Interval 374 ms   EKG QTC Calculation 364 ms   EKG Calculated P Axis 45 degrees   EKG Calculated R Axis 45 degrees   EKG Calculated T Axis 223 degrees   QTC Fredericia 367 ms  POCT Glucose  Result Value Ref Range   Glucose, POC 130 (H) 70 - 105 mg/dL  CBC w/ Differential  Result Value Ref Range   WBC 28.8 (H) 4.0 - 10.5 10*9/L   RBC 3.87 (L) 4.10 - 5.60 10*12/L   HGB 11.8 (L) 12.5 - 17.0 g/dL   HCT 66.5 (L) 63.9 - 49.9 %   MCV 86.3 80.0 - 98.0 fL   MCH 30.5 27.0 - 34.0 pg   MCHC 35.3 32.0 - 36.0 g/dL   RDW 87.3 88.4 - 85.4 %   MPV 10.6 (H) 7.4 - 10.4 fL   Platelet 211 140 - 415 10*9/L   Neutrophils % 89.0 %   Lymphocytes %  3.8 %   Monocytes % 5.1 %   Eosinophils % 0.4 %   Basophils % 0.2 %   Absolute Neutrophils 25.6 (H) 1.8 - 7.8 10*9/L   Absolute Lymphocytes 1.1 0.7 - 4.5 10*9/L   Absolute Monocytes 1.5 (H) 0.1 - 1.0 10*9/L   Absolute Eosinophils 0.1 0.0 - 0.4 10*9/L   Absolute Basophils 0.1 0.0 - 0.2 10*9/L  Morphology Review  Result Value Ref Range   Hypersegmented Neutrophils Present (A) Not Present  Results for orders placed or performed during the hospital encounter of 02/07/24  Basic Metabolic Panel  Result Value Ref Range   Sodium 126 (L) 135 - 145 mmol/L   Potassium 4.3 3.5 - 5.0 mmol/L   Chloride 88 (L) 98 - 107 mmol/L   CO2 33.3 (H) 21.0 - 32.0 mmol/L   Anion Gap 5 3 - 11 mmol/L   BUN 44 (H) 8 - 20 mg/dL   Creatinine 8.56 (H) 9.19 - 1.30 mg/dL   BUN/Creatinine Ratio 31    eGFR CKD-EPI (2021) Male 51 (L) >=60 mL/min/1.35m2   Glucose 59 (L) 70 - 179 mg/dL   Calcium  8.3 (L) 8.5 - 10.1 mg/dL  CBC  Result Value Ref Range   WBC 24.7 (H) 4.0 - 10.5 10*9/L   RBC 3.59 (L) 4.10 - 5.60 10*12/L   HGB 11.1 (L) 12.5 - 17.0 g/dL   HCT 68.7 (L) 63.9 - 49.9 %   MCV 86.9 80.0 - 98.0 fL   MCH 30.9 27.0 - 34.0 pg   MCHC 35.6 32.0 - 36.0 g/dL   RDW 87.2 88.4 - 85.4 %   MPV 10.8 (H) 7.4 - 10.4 fL   Platelet 163 140 - 415 10*9/L  Read PPD  Result Value Ref Range   TB Skin  Test Negative   Morphology Review  Result Value Ref Range   Smear Review Comments See Comment Undefined   CT Chest Abdomen Pelvis Wo Contrast Result Date: 02/12/2024 EXAM:  CT of the Chest, Abdomen and Pelvis without Contrast  HISTORY:  Lower abdominal pain. Pleural effusion.  TECHNIQUE: Routine CT of the chest, abdomen and pelvis without IV contrast.  Oral Contrast:  none.   AEC (automated exposure control) and/or manual techniques such as size-specific kV and mAs are employed where appropriate to reduce radiation exposure for all CT exams.  COMPARISON:  None  CHEST FINDINGS:  CARDIAC/MEDIASTINUM:  Aortic and great vessel  atherosclerosis. No aortic aneurysm. Coronary atherosclerosis in a three-vessel distribution. Trace pericardial effusion. Small nonenlarged mediastinal lymph nodes are likely reactive.  LUNGS/PLEURA:   Diffuse centrilobular and paraseptal emphysema. Regions of consolidation within the right lower lobe and right middle lobe are present and inflammatory/infectious etiology is favored. Bronchial wall thickening is most pronounced within the right lower lobe airways. Moderate to large right pleural effusion and small left pleural effusion are present. No pneumothorax.  BONES/SOFT TISSUES:  Mild osseous demineralization. Thoracic spondylosis. No acute osseous abnormalities. Remote fracture of the surgical neck of the right humerus. Remote right fourth rib fracture anteriorly.  ABDOMEN AND PELVIS FINDINGS:  HEPATOBILIARY:  Gallstones present within the neck of the gallbladder. No definite CT evidence of cholecystitis. No focal hepatic parenchymal abnormalities.  PANCREAS:  Normal.  SPLEEN:  Normal.  ADRENALS:  Normal.  KIDNEYS/URETERS:  Intrarenal vascular calcifications. No hydroureteronephrosis or worrisome renal mass lesion allowing for unenhanced technique.  VASCULAR:  Aortoiliac atherosclerosis. Mesenteric atherosclerosis. No abdominal aortic aneurysm.  LYMPH NODES:  No pathologic adenopathy in the mesentery or retroperitoneum.  BOWEL/MESENTERY:  The stomach and duodenum are unremarkable.  No small bowel obstruction or bowel wall thickening. Cecal appendix appears relatively preserved without periappendiceal fat stranding. Moderate colonic stool burden is seen diffusely.  No pneumoperitoneum or ascites.  PELVIC ORGANS:  Moderate distention of the urinary bladder. Prostatic calcifications are present.  BONES/SOFT TISSUES:  Mild underlying osseous demineralization. Left femoral sliding lag screw and compression sideplate fixation hardware with an additional fixation screw in the femoral neck is partially imaged.  Bilateral hip osteoarthritis is noted. Lumbar spondylosis and facet joint arthrosis is present.    1.    Right lower lobe and right middle lobe consolidation with bronchial wall thickening most suggestive of pneumonia. 2.    Moderate to large right pleural effusion and small left pleural effusion. 3.    Diffuse centrilobular and paraseptal emphysema. 4.    Cholelithiasis without CT evidence of cholecystitis. 5.    Moderate colonic stool burden. 6.    Atherosclerosis. 7.    Moderate distention of the urinary bladder.  Signed (Electronic Signature): 02/12/2024 2:35 PM Signed By: Jeralyn Moss, MD  CT Head Wo Contrast Result Date: 02/12/2024 Exam:  CT Head without Contrast  History:  Episode of unresponsiveness  Technique: Routine brain CT without IV contrast. AEC (automated exposure control) and/or manual techniques such as size-specific kV and mAs are employed where appropriate to reduce radiation exposure for all CT exams.  Comparison:  None.  Findings:   BRAIN:  No CT evidence of hemorrhage, edema, mass or mass effect. There are scattered white matter hypodensities involving the parietal lobes bilaterally. Though potentially chronic microangiopathic ischemic changes, lack of prior imaging to assess chronicity makes definitive assessment difficult. Consider MRI for further evaluation. Ventricles and basilar cisterns are symmetric, normal dimensions.  SOFT TISSUES:  Negative. CALVARIUM:  Negative. No fracture. SINUSES AND MASTOIDS:  No mucosal thickening or fluid.    1.    Scattered white matter hypodensities potentially indicate chronic microangiopathic ischemic changes of the brain but are nonspecific in appearance as described above suggesting MRI should be considered 2.    No other acute intracranial abnormality is identified  Signed Proofreader): 02/12/2024 2:22 PM Signed By: Dow JAYSON Agee, MD  ECG 12 Lead Result Date: 02/12/2024 Sinus bradycardia Septal infarct , age undetermined ST & T wave  abnormality, consider inferior ischemia ST & T wave abnormality, consider anterolateral ischemia Abnormal ECG No previous ECGs available Confirmed by Cherie Searle ([id number]) on 02/12/2024 1:21:15 PM  XR Chest Portable Result Date: 02/12/2024 Exam:  Portable Chest  History:  Syncope, collapse  Technique:  Single frontal view.  Comparison:  02/04/2024.  Findings:   Unchanged moderate right pleural effusion with adjacent compressive atelectasis. No pneumothorax. Partially obscured cardiomediastinal silhouette.    1. Moderate right pleural effusion with adjacent compressive atelectasis. Superimposed airspace consolidation not excluded.  Signed (Electronic Signature): 02/12/2024 12:33 PM Signed By: Crysta Kyrazis, MD   Medications Administered:  Medications  cefTRIAXone  (ROCEPHIN ) 2 g in sodium chloride  0.9 % (NS) 100 mL IVPB-connector bag (has no administration in time range)  azithromycin (ZITHROMAX) 500 mg in sodium chloride  (NS) 0.9 % 250 mL IVPB-vialmate (has no administration in time range)    Discharge Medications (Medications Prescribed during this  ED visit and Patient's Home Medications) :    Your Medication List     ASK your doctor about these medications    acetaminophen  500 MG tablet Commonly known as: TYLENOL  Take 1 tablet (500 mg total) by mouth every six (6) hours as needed for pain.   amiodarone  200 MG tablet Commonly known as: PACERONE  Take 1 tablet (200 mg total) by mouth two (2) times a day.   apixaban  5 mg Tab Commonly known as: ELIQUIS  Take 1 tablet (5 mg total) by mouth two (2) times a day.   BREZTRI  AEROSPHERE 160-9-4.8 mcg/actuation inhaler Generic drug: budesonide -glycopyr-formoterol  Inhale 2 puffs two (2) times a day.   cetirizine  10 MG tablet Commonly known as: ZYRTEC  Take 1 tablet (10 mg total) by mouth daily. TAKE 1 TABLET (10 MG TOTAL) BY MOUTH DAILY.   dilTIAZem  240 MG 24 hr capsule Commonly known as: CARDIZEM  CD Take 1 capsule (240 mg total) by mouth  daily. TAKE 1 CAPSULE (240 MG TOTAL) BY MOUTH DAILY.   fluticasone  propionate 50 mcg/actuation nasal spray Commonly known as: FLONASE  1 spray into each nostril daily.   furosemide  20 MG tablet Commonly known as: LASIX  Take 3 tablets (60 mg total) by mouth two (2) times a day.   melatonin 1 mg Tab tablet Take 3 tablets (3 mg total) by mouth nightly.   nitrofurantoin (macrocrystal-monohydrate) 100 MG capsule Commonly known as: MACROBID Take 1 capsule (100 mg total) by mouth two (2) times a day.   ondansetron  4 MG tablet Commonly known as: ZOFRAN  Take 1 tablet (4 mg total) by mouth every eight (8) hours as needed for nausea.   phenazopyridine 200 MG tablet Commonly known as: Pyridium Take 1 tablet (200 mg total) by mouth in the morning.   pravastatin 80 MG tablet Commonly known as: PRAVACHOL Take 1 tablet (80 mg total) by mouth daily.          Hunter Jeoffrey Murray, [place] 02/12/24 [phone]

## 2024-02-12 NOTE — ED Notes (Signed)
 Phlebotomy attempted to collect lactic acid from patient, patient currently refusing all sticks. Unable to pull from IV.

## 2024-02-13 DIAGNOSIS — Z79899 Other long term (current) drug therapy: Secondary | ICD-10-CM | POA: Diagnosis not present

## 2024-02-13 DIAGNOSIS — R55 Syncope and collapse: Secondary | ICD-10-CM | POA: Diagnosis not present

## 2024-02-13 DIAGNOSIS — I480 Paroxysmal atrial fibrillation: Secondary | ICD-10-CM | POA: Diagnosis not present

## 2024-02-13 DIAGNOSIS — N3000 Acute cystitis without hematuria: Secondary | ICD-10-CM | POA: Diagnosis not present

## 2024-02-13 DIAGNOSIS — I5031 Acute diastolic (congestive) heart failure: Secondary | ICD-10-CM | POA: Diagnosis not present

## 2024-02-13 DIAGNOSIS — J90 Pleural effusion, not elsewhere classified: Secondary | ICD-10-CM | POA: Diagnosis not present

## 2024-02-13 DIAGNOSIS — J449 Chronic obstructive pulmonary disease, unspecified: Secondary | ICD-10-CM | POA: Diagnosis not present

## 2024-02-13 DIAGNOSIS — R54 Age-related physical debility: Secondary | ICD-10-CM | POA: Diagnosis not present

## 2024-02-13 DIAGNOSIS — N1832 Chronic kidney disease, stage 3b: Secondary | ICD-10-CM | POA: Diagnosis not present

## 2024-02-13 NOTE — Nursing Note (Signed)
   02/13/24 1008  General  Care Manager / Social Worker assessed the patient by  In person interview with patient;In person interview with family (Wife [name] at bedside)  Orientation Level Oriented X4  Functional level prior to admission Partially Assisted  Who provides care at home? Family member  Level of assistance required Bathing;Dressing  Advance Directive (Medical Treatment)  Does patient have an advance directive covering medical treatment? Patient does not have advance directive covering medical treatment.  Reason patient does not have an advance directive covering medical treatment: Patient needs follow-up to complete one.  Health Care Decision Maker [HCDM] (Medical & Mental Health Treatment)  Healthcare Decision Maker HCDM documented in the HCDM/Contact Info section.  Readmission Assessment  Have you been hospitalized in the last 30 days? No  Patient Information   Lives with  Spouse/significant other  Type of Residence Private residence  Location/Detail See demo  Support Systems/Concerns Friends/Neighbors;Family Members;Children;Spouse  Responsibilities/Dependents at home? No  Home Care services in place prior to admission? No  Equipment Currently Used at Home commode chair;raised toilet seat;walker, rolling;cane, straight;grab bar, tub/shower  Currently receiving outpatient dialysis? No  Financial Information  Need for financial assistance? No  Discharge Needs Assessment  Concerns to be Addressed discharge planning  Clinical risk factors > 65;History of Falls;Functional Limitations;Poor Health Literacy  Barriers to taking medications No  Prior overnight hospital stay or ED visit in last 90 days No  Anticipated Changes Related to Illness inability to care for self  Equipment Needed After Discharge shower chair  Patient at risk for readmission? No  Difficult - Complex Assessment  Is patient identified as a difficult/complex discharge? No  Discharge Plan  Screen findings  are Discharge planning needs identified or anticipated (Comment).  Quality data for continuing care services shared with patient and/or representative? Yes  Patient and/or family were provided with choice of facilities / services that are available and appropriate to meet post hospital care needs? N/A  Initial Assessment complete? Yes

## 2024-02-13 NOTE — Transitions of Care (Post Inpatient/ED Visit) (Unsigned)
 02/13/2024  Patient ID: Victor Shields, male   DOB: [birth year], 73 y.o.   MRN: [id number]  02/13/23 1153 am: [facility] TOC RN CM chart review for potential discharge follow up in associated with patient primary care provider. As of 1148 am it appears patient remain inpatient at [facility]. It was also noted on 02/05/24 documentation encounter that patient was discharged/transferred at that point to SNF/LTC at [facility] [place]. Chart review indicates this patient arrived from [facility] for current admission/ED visit. TOC will follow for any discharge.   Bing Edison MSN, RN RN Case Sales executive Health  [facility] Office Hours M-F [phone] Direct Dial: [phone] Main Phone [phone]  Fax: [phone] [email or web address]

## 2024-02-13 NOTE — Nursing Note (Signed)
 Pt had a dizzy spell while standing with therapy. Assisted back to bed by therapy. Is talking to staff now. Vs were 97%, 67 hr, 16r, 153/43 map 71, 36.3 c. Hospitalist at bedside and aware. Wife at bedside and aware as well.

## 2024-02-14 DIAGNOSIS — R55 Syncope and collapse: Secondary | ICD-10-CM | POA: Diagnosis not present

## 2024-02-14 DIAGNOSIS — J449 Chronic obstructive pulmonary disease, unspecified: Secondary | ICD-10-CM | POA: Diagnosis not present

## 2024-02-14 DIAGNOSIS — I5031 Acute diastolic (congestive) heart failure: Secondary | ICD-10-CM | POA: Diagnosis not present

## 2024-02-14 DIAGNOSIS — Z720 Tobacco use: Secondary | ICD-10-CM | POA: Diagnosis not present

## 2024-02-14 DIAGNOSIS — N3000 Acute cystitis without hematuria: Secondary | ICD-10-CM | POA: Diagnosis not present

## 2024-02-14 DIAGNOSIS — J9601 Acute respiratory failure with hypoxia: Secondary | ICD-10-CM | POA: Diagnosis not present

## 2024-02-14 DIAGNOSIS — Z79899 Other long term (current) drug therapy: Secondary | ICD-10-CM | POA: Diagnosis not present

## 2024-02-14 DIAGNOSIS — J441 Chronic obstructive pulmonary disease with (acute) exacerbation: Secondary | ICD-10-CM | POA: Diagnosis not present

## 2024-02-14 DIAGNOSIS — E782 Mixed hyperlipidemia: Secondary | ICD-10-CM | POA: Diagnosis not present

## 2024-02-14 DIAGNOSIS — I10 Essential (primary) hypertension: Secondary | ICD-10-CM | POA: Diagnosis not present

## 2024-02-14 DIAGNOSIS — J302 Other seasonal allergic rhinitis: Secondary | ICD-10-CM | POA: Diagnosis not present

## 2024-02-14 DIAGNOSIS — R54 Age-related physical debility: Secondary | ICD-10-CM | POA: Diagnosis not present

## 2024-02-14 DIAGNOSIS — J90 Pleural effusion, not elsewhere classified: Secondary | ICD-10-CM | POA: Diagnosis not present

## 2024-02-14 DIAGNOSIS — I480 Paroxysmal atrial fibrillation: Secondary | ICD-10-CM | POA: Diagnosis not present

## 2024-02-14 DIAGNOSIS — N1832 Chronic kidney disease, stage 3b: Secondary | ICD-10-CM | POA: Diagnosis not present

## 2024-02-14 NOTE — Nursing Note (Signed)
 Destiny notified SW that insurance approved Mr. Brannan to return today. SW notified Mr. Bretta and discharge summary was completed in Epic. SW asked Destiny at [facility] if their fleeta could pick Mr. Clayson up this afternoon and called patient's wife to notify her of discharge.

## 2024-02-14 NOTE — Discharge Summary (Addendum)
 ------------------------------------------------------------------------------- Attestation signed by Victor Tyronne Larger, MD at 04/08/24 1323 I have seen, examined, and chart reviewed for  Victor Shields and supervised the nurse practitioner/PA in the care of the patient.  Relevant conversation regarding plan of care has been done.  I have discussed with the APP and agree with the assessment and plan.  Hzw:Jtjxz and alert.  No apparent distress Heent: wnl Heart: RRR Pulmonary: CTAB Abdomen:Soft, NT, ND Extremities:No edema  Changed antibiotic for UTI from IV to oral at discharge. Amiodarone  and Cardizem  dose adjusted during hospital stay.   [name], MD -------------------------------------------------------------------------------   Victor Shields [facility]   Discharge date:   February 14, 2024 Length of stay:    LOS: 2 days    Discharge Service:   [facility] Hospitalists Discharge Attending Physician: No att. providers found Discharge to:    To Skilled Nursing Facility Condition at Discharge:  fair Code status:                         Full Code    _________________________________________________________________________   Admission HPI    Patient admitted on: 02/12/2024 11:53 AM  Patient admitted by: Victor Manus Para, DO    CHIEF COMPLAINT:  syncope   Day of admission HPI:  Victor Shields  is a 73 y.o. male with a PMH significant for chronic diastolic heart failure, chronic pleural effusions, atrial fibrillation on Eliquis , COPD who presented with syncope.  Patient was at his skilled nursing facility using the restroom when he had an episode of syncope.  He was straining to have a bowel movement.  Patient states he has not been feeling well lately.  He was recently admitted to the hospital and he been where he was diagnosed with bilateral pleural effusions that were transudative secondary to heart failure, atrial fibrillation.  He then went to a  skilled nursing facility for rehab where he developed to UTI.  He was being treated with Macrobid which was not helping.  He was also started on Pyridium.   Patient admitted on Home O2? - no Patient on home anticoagulant? -  yes, Eliquis  Patient admitted with Chronic home foley catheter? - no Foley catheter placed or replaced by another service prior to admission? - no Central Line Status: NONE   Mental Status on Admission: The patient is Alert and oriented to PERSON The patient is Alert And oriented to TIME The patient is Alert and oriented to LOCATION   Problem List, Assessment & Plan     ASSESSMENT & PLAN (In order of descending acuity)   Syncope - Possibly vasovagal as patient was on the toilet going to the bathroom - Patient was also recently being treated for UTI with Macrobid and Pyridium - Patient had recent echocardiogram with normal EF - Will monitor on telemetry   Acute cystitis without hematuria - Patient was on Macrobid and Pyridium as outpatient - Urinalysis grossly positive, with 300 white blood cells - Start IV ceftriaxone  => on 02/14/2024 => changed to cefepime - Follow urine culture, which has been ordered - Blood culture drawn 02/12/2019 25 x 2 are negative over 24 hours   Age-related physical ability - Due to age, chronic comorbidities with COPD, acute hospitalization - Will consult PT and OT - Patient was at skilled nursing facility prior to this hospitalization   Chronic bilateral pleural effusions - The right side was drained at recent hospitalization at outside hospital, was found to be transudative, cultures  negative - Continue IV Lasix    COPD - Continue home inhalers   Paroxysmal atrial fibrillation - Continue amiodarone , metoprolol  - Will hold Eliquis  for few days due to his skin lacerations and significant bruising   Acute diastolic congestive heart failure - Patient still having intermittent volume overload and pleural effusions - IV Lasix  as  above   Chronic kidney disease stage IIIb - Continue to monitor while diuresing   ADDITIONAL NON-ACUTE FINDINGS, OBSERVATIONS, FAMILY DISCUSSIONS, ETC. (When present):   Physical exam General: No acute distress; chronically ill-appearing; very hard of hearing HEENT [place]/AT; moist mucous membrane Heart: Heart rate wnl: Rhythm is reg    Lungs: Clear to auscultation bilaterally Abdomen: Soft nontender nondistended Extremities: No peripheral edema Skin: Multiple areas of wounds on his arms and legs with mostly skin tears and skin ulceration Neuropsych: Awake alert and oriented x 4;   February 13, 2024 => medication change due to soft BP Patient is having soft blood pressure with MAP as low as 58 and averaging 62 since yesterday.  At times it improves enough to have maps of 71. As such, we will decrease his amiodarone  from 200 mg twice a day to 100 mg twice a day We will reduce the Cardizem  from 240 mg daily 180 mg daily We will reduce the Lasix  from 60 mg twice a day to 40 mg twice a day     DVT Prophylaxis Ordered: on novel anticoagulant (dabigatran, effient, rivaroxaban, apixaban , prasugrel) __________________________________________________________________________     February 14, 2024 => Received call from Luke Raisin, LPN regarding patient His hemoglobin is low at 7.2 with hematocrit of 20.2. 1 unit of blood transfusion has been ordered Nurse will obtain consent Patient's antibiotic of ceftriaxone  will be changed to cefepime 1 g twice a day for 7 days We will continue Zithromax 500 mg IV every 24 hours with first dose on August 7 x 5 days Vital signs are stable: Afebrile overnight heart rates in the 60s respiratory rate in the 18-19's SPO2 in 100s BP averaging with maps of 60-70 Nurse reports that his skin is dark but patient has a history of bruising and his blood thinner has been discontinued    Timeline of Significant Events: No notes on file   Mental Status On day of Discharge:   The patient is Alert and oriented to PERSON The patient is Alert And oriented to TIME The patient is Alert and oriented to LOCATION  CODE STATUS :                    Full Code   An advanced care planning discussion was had with patient and/or patient's decisions maker (documented separately).  Patient discharged on Home O2? -Will do a 6-minute walk test => Patient discharged on home anticoagulant? -Patient takes Eliquis  5 mg twice a day for A-fib  Foley Catheter status: None Central Line Status: NONE  Time Spent on Discharge I spent greater than 30 minutes counseling and coordinating care for the discharge of this patient. The patient, wife and I discussed the importance of outpatient follow-up as well as concerning signs and symptoms that would require immediate evaluation by a medical professional. The aforementioned conversation participants understand  and did show insight. I did use teachback to ensure understanding. The above participant/s is aware that not following the discussed plan, recommendations, and follow up can lead to severe negative effects on the patient's health, up to and including death.  Discharge Medications     Your  Medication List     STOP taking these medications    nitrofurantoin (macrocrystal-monohydrate) 100 MG capsule Commonly known as: MACROBID   phenazopyridine 200 MG tablet Commonly known as: Pyridium       START taking these medications    cefdinir  300 MG capsule Commonly known as: OMNICEF  Take 1 capsule (300 mg total) by mouth every twelve (12) hours for 10 days.   doxycycline  100 MG capsule Commonly known as: VIBRAMYCIN  Take 1 capsule (100 mg total) by mouth two (2) times a day for 10 days. Please take with food to avoid stomach upset       CHANGE how you take these medications    amiodarone  100 MG tablet Commonly known as: PACERONE  Take 1 tablet (100 mg total) by mouth two (2) times a day. What changed:  medication  strength how much to take   dilTIAZem  180 MG 24 hr capsule Commonly known as: CARDIZEM  CD Take 1 capsule (180 mg total) by mouth daily. Start taking on: February 15, 2024 What changed:  medication strength how much to take additional instructions   furosemide  40 MG tablet Commonly known as: LASIX  Take 1 tablet (40 mg total) by mouth two (2) times a day. What changed:  medication strength how much to take       CONTINUE taking these medications    acetaminophen  500 MG tablet Commonly known as: TYLENOL  Take 1 tablet (500 mg total) by mouth every six (6) hours as needed for pain.   apixaban  5 mg Tab Commonly known as: ELIQUIS  Take 1 tablet (5 mg total) by mouth two (2) times a day.   BREZTRI  AEROSPHERE 160-9-4.8 mcg/actuation inhaler Generic drug: budesonide -glycopyr-formoterol  Inhale 2 puffs two (2) times a day.   cetirizine  10 MG tablet Commonly known as: ZYRTEC  Take 1 tablet (10 mg total) by mouth daily. TAKE 1 TABLET (10 MG TOTAL) BY MOUTH DAILY.   fluticasone  propionate 50 mcg/actuation nasal spray Commonly known as: FLONASE  1 spray into each nostril daily.   melatonin 1 mg Tab tablet Take 3 tablets (3 mg total) by mouth nightly.   ondansetron  4 MG tablet Commonly known as: ZOFRAN  Take 1 tablet (4 mg total) by mouth every eight (8) hours as needed for nausea.   pravastatin 80 MG tablet Commonly known as: PRAVACHOL Take 1 tablet (80 mg total) by mouth daily.       _____________________________________  Nutrition:                                   Unable to complete Malnutrition Assessment at this time due to (comment) (02/13/24 1230)             ___________________________________________  Discharge Instructions   Recommend that when patient is at [facility] rehab, recheck CBC in the morning to recheck hemoglobin hematocrit.  Nutrition:                                   Activity:                                   Activity Instructions      Activity as tolerated         Appointments:  Appointments which have been scheduled for you    Feb 15, 2024 9:40 AM PHYSICAL THERAPY EVALUATION with Rankin Lawrence, PT PHYSICAL THERAPY [facility] [place] [address] [place] [address] [phone]     Feb 17, 2024 10:30 AM OCCUPATIONAL THERAPY EVALUATION with LTC OT PROVIDER 1- [facility] OCCUPATIONAL THERAPY [facility]TC [facility] [place] [address] [place] [address] [phone]     Feb 17, 2024 4:15 PM SPEECH THERAPY EVALUATION with Ulanda Moats, SLP [facility] LANGUAGE PATHOLOGY LTC [facility] [place] [address] [place] [address] [phone]     Feb 17, 2024 5:30 PM PT TREATMENT 45 with Redell Dustman, PTA PHYSICAL THERAPY LTC [facility] [place] [address] [place] [address] [phone]     Feb 18, 2024 5:30 PM PT TREATMENT 45 with Redell Dustman, PTA PHYSICAL THERAPY LTC [facility] [place] [address] [place] [address] [phone]     Feb 19, 2024 5:30 PM PT TREATMENT 45 with Redell Dustman, PTA PHYSICAL THERAPY LTC [facility] [place] [address] [place] [address] [phone]     Feb 20, 2024 5:30 PM PT TREATMENT 45 with Redell Dustman, PTA PHYSICAL THERAPY LTC [facility] [place] [address] [place] [address] [phone]     2024-03-14 5:30 PM PT TREATMENT 45 with Redell Dustman, PTA PHYSICAL THERAPY LTC [facility] [place] [address] [place] [address] [phone]     Feb 24, 2024 5:30 PM PT TREATMENT 45 with Redell Dustman, PTA PHYSICAL THERAPY LTC [facility] [place] [address] [place] [address] [phone]     Feb 25, 2024 5:30 PM PT TREATMENT 45 with Redell Dustman, PTA PHYSICAL THERAPY LTC [facility] [place] [address] [place] [address] [phone]     Feb 26, 2024 5:30 PM PT TREATMENT 45 with Redell Dustman, PTA PHYSICAL THERAPY LTC [facility] [place] [address] [place] [address] [phone]     Feb 27, 2024 5:30 PM PT TREATMENT 45 with Redell Dustman, PTA PHYSICAL THERAPY LTC [facility] [place] [address] [place] [address] [phone]     Feb 28, 2024 5:30 PM PT TREATMENT 45 with Redell Dustman, PTA PHYSICAL THERAPY LTC [facility] [place] [address] [place] [address] [phone]     Mar 02, 2024 5:30 PM PT TREATMENT 45 with Redell Dustman, PTA PHYSICAL THERAPY LTC [facility] [place] [address] [place] [address] [phone]         Follow Up:                              Follow Up instructions and Outpatient Referrals    Ambulatory Referral to Palliative Care     Reason for referral: serious illness   Is this a palliative referral for:  symptom management goals of care     Requested follow up plan: You would evaluate and manage.   Call MD for:  difficulty breathing, headache or visual disturbances     Call MD for:  persistent dizziness or light-headedness     Call MD for:  persistent nausea or vomiting     Call MD for:  severe uncontrolled pain     Call MD for: Temperature > 38.5 Celsius ( > 101.3 Fahrenheit)     Discharge  instructions         Allergies  Allergen Reactions  . Penicillins Other (See Comments)    UNKNOWN REACTION     Past Medical History[1]  Past Surgical History[2]   Family History[3]   Current Medications[4]  Imaging  MRI brain without contrast Result Date: 02/12/2024 Exam:  MRI Brain without IV contrast  History: Syncope  Technique: Complete brain MRI without IV contrast  Comparison: Head CT 02/12/2024  Findings: There is cerebral volume loss. There is mild ventriculomegaly. The normal callosal angle and  prominence of cortical sulci at the vertex of the head indicate that this is more likely due to central volume loss than normal pressure hydrocephalus. There is no intracranial hemorrhage, hydrocephalus, or mass effect. There is no acute infarction. 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 Mild/moderate chronic microvascular ischemic changes are present in the cerebral white matter. The cerebellar tonsils are normal in shape and location. There is no Chiari malformation or hindbrain herniation.  There is left pseudophakia. The orbits are otherwise unremarkable. The optic chiasm and pituitary gland are normal in appearance. 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 The other major skull base flow voids are intact. There is no paranasal sinus disease. There is fluid throughout the left mastoid air cells.    1.    Cerebral volume loss. 2.    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 3.    Mild/moderate chronic microvascular ischemic changes in the cerebral white matter. 4.    No acute infarction. 5.    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 6.    Fluid throughout the left mastoid air cells.  Signed (Electronic Signature): 02/12/2024 3:46 PM Signed By: Aleene JONELLE Cerise, MD  CT Chest Abdomen Pelvis Wo Contrast Result Date: 02/12/2024 EXAM:  CT of the Chest, Abdomen and Pelvis without Contrast  HISTORY:  Lower abdominal pain. Pleural effusion.  TECHNIQUE: Routine CT of the chest, abdomen and pelvis without IV contrast.  Oral Contrast:  none.   AEC (automated exposure control) and/or manual techniques such as size-specific kV and mAs are employed where appropriate to reduce radiation exposure for all CT exams.  COMPARISON:  None  CHEST FINDINGS:  CARDIAC/MEDIASTINUM:  Aortic and great vessel  atherosclerosis. No aortic aneurysm. Coronary atherosclerosis in a three-vessel distribution. Trace pericardial effusion. Small nonenlarged mediastinal lymph nodes are likely reactive.  LUNGS/PLEURA:   Diffuse centrilobular and paraseptal emphysema. Regions of consolidation within the right lower lobe and right middle lobe are present and inflammatory/infectious etiology is favored. Bronchial wall thickening is most pronounced within the right lower lobe airways. Moderate to large right pleural effusion and small left pleural effusion are present. No pneumothorax.  BONES/SOFT TISSUES:  Mild osseous demineralization. Thoracic spondylosis. No acute osseous abnormalities. Remote fracture of the surgical neck of the right humerus. Remote right fourth rib fracture anteriorly.  ABDOMEN AND PELVIS FINDINGS:  HEPATOBILIARY:  Gallstones present within the neck of the gallbladder. No definite CT evidence of cholecystitis. No focal hepatic parenchymal abnormalities.  PANCREAS:  Normal.  SPLEEN:  Normal.  ADRENALS:  Normal.  KIDNEYS/URETERS:  Intrarenal vascular calcifications. No hydroureteronephrosis or worrisome renal mass lesion allowing for unenhanced technique.  VASCULAR:  Aortoiliac atherosclerosis. Mesenteric atherosclerosis. No abdominal aortic aneurysm.  LYMPH NODES:  No pathologic adenopathy in the mesentery or retroperitoneum.  BOWEL/MESENTERY:  The stomach and duodenum are unremarkable.  No small bowel obstruction or bowel wall thickening. Cecal appendix appears relatively preserved without periappendiceal fat stranding. Moderate colonic stool burden is seen diffusely.  No pneumoperitoneum or ascites.  PELVIC ORGANS:  Moderate distention of the urinary bladder. Prostatic calcifications are present.  BONES/SOFT TISSUES:  Mild underlying osseous demineralization. Left femoral sliding lag screw and compression sideplate fixation hardware with an additional fixation screw in the femoral neck is partially imaged.  Bilateral hip osteoarthritis is noted. Lumbar spondylosis and facet joint arthrosis is present.    1.    Right lower lobe and right middle lobe consolidation with bronchial wall thickening most suggestive of pneumonia. 2.    Moderate to large right pleural effusion and small left pleural effusion. 3.    Diffuse centrilobular and paraseptal emphysema. 4.    Cholelithiasis without CT evidence of cholecystitis. 5.    Moderate colonic stool burden. 6.    Atherosclerosis. 7.    Moderate distention of the urinary bladder.  Signed (Electronic Signature): 02/12/2024 2:35 PM Signed By: Jeralyn Moss, MD  CT Head Wo Contrast Result Date: 02/12/2024 Exam:  CT Head without Contrast  History:  Episode of unresponsiveness  Technique: Routine brain CT without IV contrast. AEC (automated exposure control) and/or manual techniques such as size-specific kV and mAs are employed where appropriate to reduce radiation exposure for all CT exams.  Comparison:  None.  Findings:   BRAIN:  No CT evidence of hemorrhage, edema, mass or mass effect. There are scattered white matter hypodensities involving the parietal lobes bilaterally. Though potentially chronic microangiopathic ischemic changes, lack of prior imaging to assess chronicity makes definitive assessment difficult. Consider MRI for further evaluation. Ventricles and basilar cisterns are symmetric, normal dimensions.  SOFT TISSUES:  Negative. CALVARIUM:  Negative. No fracture. SINUSES AND MASTOIDS:  No mucosal thickening or fluid.    1.    Scattered white matter hypodensities potentially indicate chronic microangiopathic ischemic changes of the brain but are nonspecific in appearance as described above suggesting MRI should be considered 2.    No other acute intracranial abnormality is identified  Signed Proofreader): 02/12/2024 2:22 PM Signed By: Dow JAYSON Agee, MD  ECG 12 Lead Result Date: 02/12/2024 Sinus bradycardia Septal infarct , age undetermined ST & T wave  abnormality, consider inferior ischemia ST & T wave abnormality, consider anterolateral ischemia Abnormal ECG No previous ECGs available Confirmed by Cherie Searle ([id number]) on 02/12/2024 1:21:15 PM  XR Chest Portable Result Date: 02/12/2024 Exam:  Portable Chest  History:  Syncope, collapse  Technique:  Single frontal view.  Comparison:  02/04/2024.  Findings:   Unchanged moderate right pleural effusion with adjacent compressive atelectasis. No pneumothorax. Partially obscured cardiomediastinal silhouette.    1. Moderate right pleural effusion with adjacent compressive atelectasis. Superimposed airspace consolidation not excluded.  Signed (Electronic Signature): 02/12/2024 12:33 PM Signed By: Roselee Dienes, MD   Lab Results   Recent Labs    02/14/24 0754  WBC 26.3*  HGB 7.4*  HCT 20.9*  PLT 162   Recent Labs    02/12/24 1344 02/14/24 0234  NA 130* 134*  K 3.4* 2.7*  CL 88* 94*  CO2 37.5* 40.2*  BUN 44* 51*  CREATININE 1.72* 1.72*  GLU 127 99  CALCIUM  9.1 8.1*  ALBUMIN 2.5*  --   PROT 5.2*  --   BILITOT 1.6*  --   AST 27  --   ALT 40  --   ALKPHOS 115  --  MG 2.4  --   LACTATE 3.4* 1.2   Recent Labs    02/12/24 1348 02/14/24 0234  INR 1.73 1.48  APTT 29.1  --    Recent Labs    02/12/24 1324  WBCUA 367*  NITRITE Positive*  LEUKOCYTESUR Large*  BACTERIA Rare*  RBCUA 136*  BLOODU Large*  GLUCOSEU Negative  PROTEINUA 30 mg/dL*  KETONESU Negative   No results for input(s): OPIAU, BENZU, TRICYCLIC, PCPU, AMPHU, COCAU, CANNAU, BARBU, ETOH, ACETAMIN, SALICYLATE in the last 72 hours. No results for input(s): PREGTESTUR, PREGPOC in the last 72 hours. No results for input(s): OCCULTBLD, RAPSCRN, CDIFRPCR, CDIFFNAP1, A1C, CHOL, LDL, HDL, TRIG in the last 72 hours. No results for input(s): O2SOUR, FIO2ART, PHART, PCO2ART, PO2ART, HCO3ART, O2SATART, BEART in the last 72 hours. Pending Labs     Order Current  Status   Folate Level In process   Urine Culture In process   Vitamin B12 Level In process   Blood Culture #1 Preliminary result   Blood Culture #2 Preliminary result       Home Medications   Prior to Admission medications  Medication Dose, Route, Frequency  acetaminophen  (TYLENOL ) 500 MG tablet 500 mg, Every 6 hours PRN  apixaban  (ELIQUIS ) 5 mg Tab 5 mg, 2 times a day (standard)  BREZTRI  AEROSPHERE 160-9-4.8 mcg/actuation inhaler 2 puffs, 2 times a day (standard)  cetirizine  (ZYRTEC ) 10 MG tablet 10 mg, Daily (standard)  fluticasone  propionate (FLONASE ) 50 mcg/actuation nasal spray 1 spray, Daily (standard)  melatonin 1 mg Tab tablet 3 mg, Nightly  ondansetron  (ZOFRAN ) 4 MG tablet 4 mg, Every 8 hours PRN  pravastatin (PRAVACHOL) 80 MG tablet 80 mg, Daily (standard)  amiodarone  (PACERONE ) 100 MG tablet 100 mg, Oral, 2 times a day (standard)  cefdinir  (OMNICEF ) 300 MG capsule 300 mg, Oral, Every 12 hours  dilTIAZem  (CARDIZEM  CD) 180 MG 24 hr capsule 180 mg, Oral, Daily (standard)  doxycycline  (VIBRAMYCIN ) 100 MG capsule 100 mg, Oral, 2 times a day (standard), Please take with food to avoid stomach upset  furosemide  (LASIX ) 40 MG tablet 40 mg, Oral, 2 times a day (standard)   Joana JONETTA Hurst, PA Hospitalist, [facility] 02/14/24, 5:13 PM       [1] Past Medical History: Diagnosis Date  . Acute respiratory failure      . Atrial fibrillation      . CHF (congestive heart failure)      . Cognitive communication deficit   . COPD (chronic obstructive pulmonary disease)      . Hyperlipemia   . Hypertension   . Muscle atrophy   [2] No past surgical history on file. [3] History reviewed. No pertinent family history. [4] No current facility-administered medications for this encounter.  Current Outpatient Medications:  .  amiodarone  (PACERONE ) 100 MG tablet, Take 1 tablet (100 mg total) by mouth two (2) times a day., Disp: 60 tablet, Rfl: 11 .  cefdinir  (OMNICEF ) 300 MG capsule,  Take 1 capsule (300 mg total) by mouth every twelve (12) hours for 10 days., Disp: 20 capsule, Rfl: 0 .  [START ON 02/15/2024] dilTIAZem  (CARDIZEM  CD) 180 MG 24 hr capsule, Take 1 capsule (180 mg total) by mouth daily., Disp: 100 capsule, Rfl: 3 .  doxycycline  (VIBRAMYCIN ) 100 MG capsule, Take 1 capsule (100 mg total) by mouth two (2) times a day for 10 days. Please take with food to avoid stomach upset, Disp: 20 capsule, Rfl: 0 .  furosemide  (LASIX ) 40 MG tablet, Take 1 tablet (40 mg  total) by mouth two (2) times a day., Disp: 60 tablet, Rfl: 0  Facility-Administered Medications Ordered in Other Encounters:  .  [ - Suspended Admission] acetaminophen  (TYLENOL ) tablet 500 mg, 500 mg, Oral, Q6H PRN, Leavy Waddell Hun, FNP, 500 mg at 02/11/24 1439 .  [ - Suspended Admission] albuterol  (PROVENTIL  HFA;VENTOLIN  HFA) 90 mcg/actuation inhaler 2 puff, 2 puff, Inhalation, Q6H PRN, Leavy Waddell Hun, FNP, 2 puff at 02/08/24 1712 .  [ - Suspended Admission] albuterol  2.5 mg /3 mL (0.083 %) nebulizer solution 2.5 mg, 2.5 mg, Nebulization, Q6H PRN, Leavy Waddell Hun, FNP .  [ - Suspended Admission] amiodarone  (PACERONE ) tablet 200 mg, 200 mg, Oral, BID, Leavy Waddell Hun, FNP, 200 mg at 02/12/24 9261 .  [ - Suspended Admission] apixaban  (ELIQUIS ) tablet 5 mg, 5 mg, Oral, BID, Leavy Waddell Hun, FNP, 5 mg at 02/12/24 9261 .  [ - Suspended Admission] budesonide -glycopyr-formoterol  (Breztri ) 160-9-4.8 mcg/actuation inhaler 2 puff, 2 puff, Inhalation, BID (RT), Leavy Waddell Hun, FNP, 2 puff at 02/12/24 [phone] .  [ - Suspended Admission] cetirizine  (ZYRTEC ) tablet 10 mg, 10 mg, Oral, Nightly (2000), Leavy Waddell [place], [place], 10 mg at 02/11/24 2030 .  [ - Suspended Admission] dilTIAZem  (CARDIZEM  CD) 24 hr capsule 240 mg, 240 mg, Oral, Daily, Leavy Waddell [place], [place], 240 mg at 02/12/24 9261 .  [ - Suspended Admission] fluticasone  propionate (FLONASE ) 50 mcg/actuation nasal spray 1 spray, 1 spray, Each  Nare, Daily, Leavy Waddell Hun, FNP, 1 spray at 02/12/24 [phone] .  [ - Suspended Admission] furosemide  (LASIX ) tablet 60 mg, 60 mg, Oral, BID, Leavy Waddell [place], FNP, 60 mg at 02/12/24 9261 .  [ - Suspended Admission] meclizine  (ANTIVERT ) tablet 25 mg, 25 mg, Oral, TID PRN, Leavy Waddell Hun, FNP .  [ - Suspended Admission] melatonin tablet 3 mg, 3 mg, Oral, QPM, Leavy Waddell Hun, FNP, 3 mg at 02/11/24 2030 .  [ - Suspended Admission] menthol  4 % Gel, , Topical (Top), BID PRN, Leavy Waddell Hun, FNP .  [ - Suspended Admission] ondansetron  (ZOFRAN ) tablet 4 mg, 4 mg, Oral, Q8H PRN, Leavy Waddell Hun, FNP, 4 mg at 02/11/24 1108 .  [ - Suspended Admission] pravastatin (PRAVACHOL) tablet 80 mg, 80 mg, Oral, Nightly (2000), Leavy Waddell [place], [place], 80 mg at 02/11/24 2030 .  [ - Suspended Admission] tuberculin PPD injection, 5 Units, Intradermal, Once, [place], Waddell Hun, FNP

## 2024-02-14 NOTE — Nursing Note (Signed)
 Cancelled PICC line with vascular wellness at this time.

## 2024-02-14 NOTE — Nursing Note (Signed)
   02/14/24 1345  Final Assessment  Patient's Post Acute Contact Information see demo  Has a PCP appointment been made? No  Has a specialist appointment been made? No  Post Acute Facility needed at discharge? Yes  Post Acute Facility SNF (Skilled Nursing Facility)  Facility (Name/Phone #) [facility] to finish SNF days  Home Care/ Home Medical Equipment needed at discharge? No  Outpatient/Community Referrals needed for discharge? No  Currently receiving outpatient dialysis? No  Discharge Disposition Skilled Nursing Facility  Transportation Anticipated [place], wheelchair accessible  Quality data for continuing care services shared with patient and/or representative? Yes  Patient and/or family were provided with choice of facilities / services that are available and appropriate to meet post hospital care needs? N/A  Final Assessment Complete  Final Assessment Complete Yes

## 2024-02-15 DIAGNOSIS — G9341 Metabolic encephalopathy: Secondary | ICD-10-CM | POA: Diagnosis not present

## 2024-02-15 DIAGNOSIS — R4781 Slurred speech: Secondary | ICD-10-CM | POA: Diagnosis not present

## 2024-02-15 DIAGNOSIS — J189 Pneumonia, unspecified organism: Secondary | ICD-10-CM | POA: Diagnosis not present

## 2024-02-15 DIAGNOSIS — Z515 Encounter for palliative care: Secondary | ICD-10-CM | POA: Diagnosis not present

## 2024-02-15 DIAGNOSIS — R410 Disorientation, unspecified: Secondary | ICD-10-CM | POA: Diagnosis not present

## 2024-02-15 DIAGNOSIS — J449 Chronic obstructive pulmonary disease, unspecified: Secondary | ICD-10-CM | POA: Diagnosis not present

## 2024-02-15 DIAGNOSIS — J9811 Atelectasis: Secondary | ICD-10-CM | POA: Diagnosis not present

## 2024-02-15 DIAGNOSIS — D649 Anemia, unspecified: Secondary | ICD-10-CM | POA: Diagnosis not present

## 2024-02-15 DIAGNOSIS — R4182 Altered mental status, unspecified: Secondary | ICD-10-CM | POA: Diagnosis not present

## 2024-02-15 DIAGNOSIS — I130 Hypertensive heart and chronic kidney disease with heart failure and stage 1 through stage 4 chronic kidney disease, or unspecified chronic kidney disease: Secondary | ICD-10-CM | POA: Diagnosis not present

## 2024-02-15 DIAGNOSIS — Z66 Do not resuscitate: Secondary | ICD-10-CM | POA: Diagnosis not present

## 2024-02-15 DIAGNOSIS — I5030 Unspecified diastolic (congestive) heart failure: Secondary | ICD-10-CM | POA: Diagnosis not present

## 2024-02-15 DIAGNOSIS — E785 Hyperlipidemia, unspecified: Secondary | ICD-10-CM | POA: Diagnosis not present

## 2024-02-15 DIAGNOSIS — R918 Other nonspecific abnormal finding of lung field: Secondary | ICD-10-CM | POA: Diagnosis not present

## 2024-02-15 DIAGNOSIS — I4891 Unspecified atrial fibrillation: Secondary | ICD-10-CM | POA: Diagnosis not present

## 2024-02-15 DIAGNOSIS — N390 Urinary tract infection, site not specified: Secondary | ICD-10-CM | POA: Diagnosis not present

## 2024-02-15 DIAGNOSIS — K922 Gastrointestinal hemorrhage, unspecified: Secondary | ICD-10-CM | POA: Diagnosis not present

## 2024-02-15 DIAGNOSIS — I672 Cerebral atherosclerosis: Secondary | ICD-10-CM | POA: Diagnosis not present

## 2024-02-15 DIAGNOSIS — J90 Pleural effusion, not elsewhere classified: Secondary | ICD-10-CM | POA: Diagnosis not present

## 2024-02-15 DIAGNOSIS — I480 Paroxysmal atrial fibrillation: Secondary | ICD-10-CM | POA: Diagnosis not present

## 2024-02-15 DIAGNOSIS — I5032 Chronic diastolic (congestive) heart failure: Secondary | ICD-10-CM | POA: Diagnosis not present

## 2024-02-15 DIAGNOSIS — N183 Chronic kidney disease, stage 3 unspecified: Secondary | ICD-10-CM | POA: Diagnosis not present

## 2024-02-15 DIAGNOSIS — R7889 Finding of other specified substances, not normally found in blood: Secondary | ICD-10-CM | POA: Diagnosis not present

## 2024-02-15 DIAGNOSIS — Z79899 Other long term (current) drug therapy: Secondary | ICD-10-CM | POA: Diagnosis not present

## 2024-02-16 DIAGNOSIS — R4182 Altered mental status, unspecified: Secondary | ICD-10-CM | POA: Diagnosis not present

## 2024-02-16 DIAGNOSIS — G9341 Metabolic encephalopathy: Secondary | ICD-10-CM | POA: Diagnosis not present

## 2024-02-17 DIAGNOSIS — N390 Urinary tract infection, site not specified: Secondary | ICD-10-CM | POA: Diagnosis not present

## 2024-02-17 DIAGNOSIS — I5030 Unspecified diastolic (congestive) heart failure: Secondary | ICD-10-CM | POA: Diagnosis not present

## 2024-02-17 DIAGNOSIS — J189 Pneumonia, unspecified organism: Secondary | ICD-10-CM | POA: Diagnosis not present

## 2024-02-17 DIAGNOSIS — I4891 Unspecified atrial fibrillation: Secondary | ICD-10-CM | POA: Diagnosis not present

## 2024-02-17 DIAGNOSIS — Z79899 Other long term (current) drug therapy: Secondary | ICD-10-CM | POA: Diagnosis not present

## 2024-02-17 DIAGNOSIS — J449 Chronic obstructive pulmonary disease, unspecified: Secondary | ICD-10-CM | POA: Diagnosis not present

## 2024-02-20 NOTE — Telephone Encounter (Unsigned)
 Copied from CRM [id number]. Topic: General - Other >> Feb 19, 2024  2:59 PM Donna BRAVO wrote: Reason for CRM:  [place] [phone]  [facility] care Patient is at end of life and will likely pass within the next 7 days Hospice will adjust comfort medications as needed.

## 2024-02-25 ENCOUNTER — Other Ambulatory Visit: Payer: Self-pay | Admitting: Family

## 2024-03-06 ENCOUNTER — Ambulatory Visit: Admitting: Student

## 2024-03-09 DEATH — deceased

## 2024-12-03 ENCOUNTER — Encounter
